# Patient Record
Sex: Female | Born: 1950
Health system: Southern US, Community
[De-identification: ages and names within clinical notes are randomized; demographics above are authoritative.]

## PROBLEM LIST (undated history)

## (undated) DIAGNOSIS — R600 Localized edema: Secondary | ICD-10-CM

## (undated) DIAGNOSIS — I10 Essential (primary) hypertension: Secondary | ICD-10-CM

## (undated) DIAGNOSIS — J45909 Unspecified asthma, uncomplicated: Secondary | ICD-10-CM

## (undated) DIAGNOSIS — M419 Scoliosis, unspecified: Secondary | ICD-10-CM

## (undated) DIAGNOSIS — M4800 Spinal stenosis, site unspecified: Secondary | ICD-10-CM

## (undated) DIAGNOSIS — M1990 Unspecified osteoarthritis, unspecified site: Secondary | ICD-10-CM

## (undated) DIAGNOSIS — E7800 Pure hypercholesterolemia, unspecified: Secondary | ICD-10-CM

## (undated) DIAGNOSIS — K219 Gastro-esophageal reflux disease without esophagitis: Secondary | ICD-10-CM

## (undated) DIAGNOSIS — F458 Other somatoform disorders: Secondary | ICD-10-CM

## (undated) DIAGNOSIS — R87619 Unspecified abnormal cytological findings in specimens from cervix uteri: Secondary | ICD-10-CM

## (undated) DIAGNOSIS — E669 Obesity, unspecified: Secondary | ICD-10-CM

## (undated) DIAGNOSIS — M519 Unspecified thoracic, thoracolumbar and lumbosacral intervertebral disc disorder: Secondary | ICD-10-CM

## (undated) DIAGNOSIS — J40 Bronchitis, not specified as acute or chronic: Secondary | ICD-10-CM

## (undated) DIAGNOSIS — J189 Pneumonia, unspecified organism: Secondary | ICD-10-CM

## (undated) DIAGNOSIS — M5126 Other intervertebral disc displacement, lumbar region: Secondary | ICD-10-CM

## (undated) HISTORY — DX: Unspecified abnormal cytological findings in specimens from cervix uteri: R87.619

## (undated) HISTORY — PX: HERNIA REPAIR: SHX51

## (undated) HISTORY — PX: TONSILLECTOMY AND ADENOIDECTOMY: SUR1326

## (undated) HISTORY — DX: Pure hypercholesterolemia, unspecified: E78.00

## (undated) HISTORY — DX: Essential (primary) hypertension: I10

## (undated) HISTORY — DX: Obesity, unspecified: E66.9

## (undated) HISTORY — PX: COLONOSCOPY W/ POLYPECTOMY: SHX1380

---
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
## 1988-01-03 HISTORY — PX: BREAST EXCISIONAL BIOPSY: SUR124

## 1988-01-03 HISTORY — PX: KNEE ARTHROSCOPY: SUR90

## 1988-01-03 HISTORY — PX: BREAST BIOPSY: SHX20

## 1991-01-03 HISTORY — PX: BACK SURGERY: SHX140

## 1992-01-03 HISTORY — PX: SHOULDER ARTHROSCOPY: SHX128

## 1998-02-16 ENCOUNTER — Ambulatory Visit (HOSPITAL_BASED_OUTPATIENT_CLINIC_OR_DEPARTMENT_OTHER): Admission: RE | Admit: 1998-02-16 | Discharge: 1998-02-16 | Payer: Self-pay | Admitting: Orthopedic Surgery

## 2000-01-26 ENCOUNTER — Encounter: Admission: RE | Admit: 2000-01-26 | Discharge: 2000-01-26 | Payer: Self-pay | Admitting: Family Medicine

## 2000-01-26 ENCOUNTER — Encounter: Payer: Self-pay | Admitting: Family Medicine

## 2001-03-07 ENCOUNTER — Encounter: Payer: Self-pay | Admitting: Family Medicine

## 2001-03-07 ENCOUNTER — Encounter: Admission: RE | Admit: 2001-03-07 | Discharge: 2001-03-07 | Payer: Self-pay | Admitting: Family Medicine

## 2001-05-21 ENCOUNTER — Other Ambulatory Visit: Admission: RE | Admit: 2001-05-21 | Discharge: 2001-05-21 | Payer: Self-pay | Admitting: Family Medicine

## 2002-12-16 ENCOUNTER — Encounter: Admission: RE | Admit: 2002-12-16 | Discharge: 2002-12-16 | Payer: Self-pay | Admitting: Family Medicine

## 2003-04-15 ENCOUNTER — Ambulatory Visit (HOSPITAL_COMMUNITY): Admission: RE | Admit: 2003-04-15 | Discharge: 2003-04-15 | Payer: Self-pay | Admitting: *Deleted

## 2003-06-27 ENCOUNTER — Ambulatory Visit (HOSPITAL_COMMUNITY): Admission: RE | Admit: 2003-06-27 | Discharge: 2003-06-27 | Payer: Self-pay | Admitting: Family Medicine

## 2003-11-22 ENCOUNTER — Emergency Department (HOSPITAL_COMMUNITY): Admission: EM | Admit: 2003-11-22 | Discharge: 2003-11-22 | Payer: Self-pay | Admitting: Emergency Medicine

## 2004-01-03 HISTORY — PX: LUMBAR DISC SURGERY: SHX700

## 2004-02-01 ENCOUNTER — Other Ambulatory Visit: Admission: RE | Admit: 2004-02-01 | Discharge: 2004-02-01 | Payer: Self-pay | Admitting: Family Medicine

## 2004-02-18 ENCOUNTER — Encounter: Admission: RE | Admit: 2004-02-18 | Discharge: 2004-02-18 | Payer: Self-pay | Admitting: Family Medicine

## 2004-10-20 ENCOUNTER — Ambulatory Visit (HOSPITAL_COMMUNITY): Admission: RE | Admit: 2004-10-20 | Discharge: 2004-10-21 | Payer: Self-pay | Admitting: Neurosurgery

## 2004-12-30 ENCOUNTER — Encounter: Admission: RE | Admit: 2004-12-30 | Discharge: 2004-12-30 | Payer: Self-pay | Admitting: Neurosurgery

## 2005-01-02 HISTORY — PX: CERVICAL DISC SURGERY: SHX588

## 2005-02-08 ENCOUNTER — Other Ambulatory Visit: Admission: RE | Admit: 2005-02-08 | Discharge: 2005-02-08 | Payer: Self-pay | Admitting: Family Medicine

## 2005-03-08 ENCOUNTER — Encounter: Admission: RE | Admit: 2005-03-08 | Discharge: 2005-03-08 | Payer: Self-pay | Admitting: Neurosurgery

## 2005-04-06 ENCOUNTER — Encounter: Admission: RE | Admit: 2005-04-06 | Discharge: 2005-04-06 | Payer: Self-pay | Admitting: Family Medicine

## 2005-04-13 ENCOUNTER — Observation Stay (HOSPITAL_COMMUNITY): Admission: RE | Admit: 2005-04-13 | Discharge: 2005-04-15 | Payer: Self-pay | Admitting: Neurosurgery

## 2006-02-09 ENCOUNTER — Other Ambulatory Visit: Admission: RE | Admit: 2006-02-09 | Discharge: 2006-02-09 | Payer: Self-pay | Admitting: Family Medicine

## 2006-02-20 ENCOUNTER — Encounter: Admission: RE | Admit: 2006-02-20 | Discharge: 2006-02-20 | Payer: Self-pay | Admitting: Family Medicine

## 2006-04-10 ENCOUNTER — Encounter: Admission: RE | Admit: 2006-04-10 | Discharge: 2006-04-10 | Payer: Self-pay | Admitting: Family Medicine

## 2006-04-18 ENCOUNTER — Encounter: Admission: RE | Admit: 2006-04-18 | Discharge: 2006-04-18 | Payer: Self-pay | Admitting: Family Medicine

## 2006-05-23 ENCOUNTER — Other Ambulatory Visit: Admission: RE | Admit: 2006-05-23 | Discharge: 2006-05-23 | Payer: Self-pay | Admitting: Family Medicine

## 2007-01-03 HISTORY — PX: TOTAL KNEE ARTHROPLASTY: SHX125

## 2007-02-13 ENCOUNTER — Other Ambulatory Visit: Admission: RE | Admit: 2007-02-13 | Discharge: 2007-02-13 | Payer: Self-pay | Admitting: Family Medicine

## 2007-04-11 ENCOUNTER — Encounter: Admission: RE | Admit: 2007-04-11 | Discharge: 2007-04-11 | Payer: Self-pay | Admitting: Family Medicine

## 2007-11-18 ENCOUNTER — Inpatient Hospital Stay (HOSPITAL_COMMUNITY): Admission: RE | Admit: 2007-11-18 | Discharge: 2007-11-20 | Payer: Self-pay | Admitting: Orthopedic Surgery

## 2008-02-17 ENCOUNTER — Other Ambulatory Visit: Admission: RE | Admit: 2008-02-17 | Discharge: 2008-02-17 | Payer: Self-pay | Admitting: Family Medicine

## 2008-04-14 ENCOUNTER — Encounter: Admission: RE | Admit: 2008-04-14 | Discharge: 2008-04-14 | Payer: Self-pay | Admitting: Family Medicine

## 2009-02-18 ENCOUNTER — Other Ambulatory Visit: Admission: RE | Admit: 2009-02-18 | Discharge: 2009-02-18 | Payer: Self-pay | Admitting: Family Medicine

## 2009-03-02 DIAGNOSIS — R87619 Unspecified abnormal cytological findings in specimens from cervix uteri: Secondary | ICD-10-CM

## 2009-03-02 HISTORY — DX: Unspecified abnormal cytological findings in specimens from cervix uteri: R87.619

## 2009-04-15 ENCOUNTER — Encounter: Admission: RE | Admit: 2009-04-15 | Discharge: 2009-04-15 | Payer: Self-pay | Admitting: Family Medicine

## 2010-01-23 ENCOUNTER — Encounter: Payer: Self-pay | Admitting: Family Medicine

## 2010-03-07 ENCOUNTER — Other Ambulatory Visit: Payer: Self-pay | Admitting: Family Medicine

## 2010-03-07 DIAGNOSIS — Z1231 Encounter for screening mammogram for malignant neoplasm of breast: Secondary | ICD-10-CM

## 2010-04-19 ENCOUNTER — Ambulatory Visit
Admission: RE | Admit: 2010-04-19 | Discharge: 2010-04-19 | Disposition: A | Discharge status: Home / Self Care | Payer: BLUE CROSS/BLUE SHIELD | Source: Ambulatory Visit | Attending: Family Medicine | Admitting: Family Medicine

## 2010-04-19 DIAGNOSIS — Z1231 Encounter for screening mammogram for malignant neoplasm of breast: Secondary | ICD-10-CM

## 2010-05-17 NOTE — Op Note (Signed)
[name], [name]                ACCOUNT NO.:  [id number]   MEDICAL RECORD NO.:  [id number]          PATIENT TYPE:  INP   LOCATION:  5033                         FACILITY:  [facility]   PHYSICIAN:  Elana Alm. Thurston Hole, M.D. DATE OF BIRTH:  1950/10/19   DATE OF PROCEDURE:  11/18/2007  DATE OF DISCHARGE:                               OPERATIVE REPORT   PREOPERATIVE DIAGNOSIS:  Right knee degenerative joint disease.   POSTOPERATIVE DIAGNOSIS:  Right knee degenerative joint disease.   PROCEDURE:  Right total knee replacement using a DePuy cemented total  knee system with #3 cemented femur, #3 cemented tibia with 15-mm  polyethylene RP tibial spacer, and 32-mm polyethylene cemented patella.   SURGEON:  Elana Alm. Thurston Hole, MD   ASSISTANT:  Julien Girt, PA   ANESTHESIA:  General.   OPERATIVE TIME:  1 hour and 30 minutes.   COMPLICATIONS:  None.   DESCRIPTION OF PROCEDURE:  Ms. Robeck was brought to the operating room  on November 18, 2007, after a femoral nerve block was placed in holding  by Anesthesia.  She was placed on the operative table in supine  position.  After being placed under general anesthesia, her right knee  was examined.  Range of motions from -5 to 125 degrees, moderate varus  deformity, and a stable ligamentous exam with normal patellar tracking.  She had a Foley catheter placed under sterile conditions.  She received  antibiotics preoperatively.  Her right leg was then prepped using  sterile DuraPrep and draped using sterile technique.  The leg was then  exsanguinated and a thigh tourniquet elevated to 375 mm.  Initially,  through a 15-cm longitudinal incision based over the patella, initial  exposure was made.  The underlying subcutaneous tissues were incised  along the skin incision.  A median arthrotomy was performed revealing an  excessive amount of normal-appearing joint fluid.  The articular  surfaces were inspected.  She had grade 4 changes medially, grade  3 and  4 changes laterally, and grade 3 and 4 changes in the patellofemoral  joint.  Osteophytes removed from the femoral condyles and tibial  plateau.  The medial and lateral meniscal remnants were removed as well  as the anterior cruciate ligament.  An intramedullary drill was then  drilled up the femoral canal for placement of distal femoral cutting  jig, which was placed in the appropriate manner by rotation and then a  distal 11-mm cut was made.  The distal femur was then incised.  The #3  was found to be the appropriate size.  The #3 cutting jig was placed in  the appropriate manner by external rotation and then these cuts were  made.  The proximal tibia was then exposed.  The tibial spines were  removed with an oscillating saw.  An intramedullary drill was drilled  down the tibial canal for placement of proximal tibial cutting jig,  which was placed in the appropriate manner by rotation and a proximal 6-  mm cut was made based off the medial or lower side.  Spacer blocks were  then placed in flexion  and extension.  The 15-mm cut blocks gave  excellent balancing, excellent stability, and excellent correction of  her flexion and varus deformities.  The proximal tibia was then exposed.  The #3 tibial base plate trial was placed on the cut tibial surface with  an excellent fit and the keel cut was made.  The PCL box cutter was then  placed on the distal femur and these cuts were made.  At this point, the  #3 femoral trial was placed and with the #3 tibial base plate trial and  a 15-mm polyethylene RP tibial spacer, knee was reduced, taken through a  range of motion 0-125 degrees with excellent stability and excellent  correction of her flexion, varus deformities and normal patellar  tracking.  Resurfacing 9-mm cut was then made on the patella and 3  locking holes placed for a 32-mm patella.  The patellar trial was placed  and again patellofemoral tracking was evaluated, and found to be  normal.  After this was done, it was felt that all the trial components were of  excellent size, fit, and stability.  They were then removed.  The knee  was then jet lavaged, irrigated with 3 liters of saline.  The proximal  tibia was then exposed.  The #3 tibial base plate with cement backing  was hammered into position with an excellent fit with excess cement  being removed from around the edges.  The #3 femoral component with  cement backing was hammered into position also with an excellent fit  with excess cement being removed from around the edges.  The 15-mm  polyethylene RP tibial spacer was placed on the tibial base plate.  The  knee reduced, taken through a range of motion 0-125 degrees with  excellent stability and excellent correction of her flexion and varus  deformities.  The 32-mm polyethylene cement backed patella was then  placed in each position and held there with a clamp.  After the cement  hardened, then the wound was further irrigated with saline.  Again,  patellofemoral tracking was evaluated to a full range of motion and this  was found to be excellent and normal patella tracking, and excellent  correction of her flexion and varus deformities.  At this point, it was  felt that all the components were of excellent size, fit, and stability.  After this was done, tourniquet was released.  Hemostasis obtained with  cautery.  The arthrotomy was then closed with #1 Ethibond suture over 2-  medium Hemovac drains.  Subcutaneous tissues closed with 0 and 2-0  Vicryl, subcuticular layer closed with 4-0 Monocryl.  Sterile dressings  were applied and a long-leg splint.  The patient then awakened,  extubated, and taken to recovery room in stable condition.  Needle and  sponge counts were correct x2 at the end the case.  Neurovascular status  normal postoperatively as well.      Robert A. Thurston Hole, M.D.  Electronically Signed     RAW/MEDQ  D:  11/18/2007  T:  11/19/2007   Job:  [id number]

## 2010-05-20 NOTE — Consult Note (Signed)
NAME:  [name], BILLING                ACCOUNT NO.:  [id number]   MEDICAL RECORD NO.:  [id number]          PATIENT TYPE:  OIB   LOCATION:  3016                         FACILITY:  [facility]   PHYSICIAN:  Corinna L. Lendell Caprice, MDDATE OF BIRTH:  [birth year]   DATE OF CONSULTATION:  04/14/2005  DATE OF DISCHARGE:                                   CONSULTATION   REASON FOR CONSULTATION:  Asthma.   IMPRESSIONS/RECOMMENDATIONS:  1.  HISTORY OF ASTHMA:  The patient has pseudo wheezing currently, but no      actual wheezing/bronchospasm.  She may have had this before.  Her chest      x-ray is negative for infiltrate, and her oxygen saturations are      reportedly normal.  She reports a cough productive of greenish sputum      today, and blood-tinged yesterday.  She may very well have acute      bronchitis.  I recommend Avelox 400 mg a day for 5 days, Mucolytic,      albuterol nebulizer as needed.  She does not need steroids.  I have      discussed this with Dr. Wynetta Emery.  2.  STATUS POST C5-6, C6-7 DISKECTOMY WITH ANTERIOR FUSION.  She is now      postoperative day #1.  3.  HYPERTENSION.  4.  HYPERLIPIDEMIA.   HISTORY OF PRESENT ILLNESS:  [name] is a pleasant 60 year old white  female patient of Dr. Shaune Pollack, who had the above-mentioned procedure  yesterday.  She apparently was ready to be discharged, but started  complaining of wheezing, shortness of breath upon discharge.  Per the nurse,  it would worsen when she would take off the oxygen.  She got an albuterol  nebulizer treatment, as well as Solu-Medrol IN.  The patient reports that  she is feeling short of breath and that she is wheezing currently.  She also  reports a cough productive of greenish sputum today, and it was blood-tinged  yesterday.  She feels that she has chest congestion that would not come up.   PAST MEDICAL HISTORY:  As above.   MEDICATIONS:  1.  Vicodin as needed.  2.  Avapro 300 mg a day.  3.  Hydrochlorothiazide 25  mg a day.  4.  Plendil 10 mg a day.  5.  Multivitamin a day.  6.  Zocor 20 mg a day.  7.  Vitamin C.  8.  Calcium with vitamin D.  9.  Labetolol 400 mg p.o. b.i.d.  10. Oxygen 2 L.  11. Medrol dose pack has been started today.   SOCIAL HISTORY:  The patient drinks occassionally.  She does not smoke.  She  is here with her husband and daughter.   FAMILY HISTORY:  Noncontributory.   REVIEW OF SYSTEMS:  As above, otherwise negative.   PHYSICAL EXAMINATION:  VITAL SIGNS:  Temperature 97.0, 89% pulse,  respiratory rate 20, blood pressure 106/67, oxygen saturation 95% on room  air.  GENERAL:  The patient is an obese white female, in no acute distress.  She  is very talkative and  has somewhat pressured speech.  HEENT:  Normocephalic and atraumatic.  Pupils equal, round, reactive to  light.  She has moist mucous membranes.  Oropharynx is clear.  NECK:  She has a soft collar in place.  LUNGS:  She has no wheezes, rhonchi, or rales.  She does have intermittent  upper airway sounds.  CARDIOVASCULAR:  Regular rate and rhythm; without murmur, gallop or rub.  ABDOMEN:  Normal bowel sounds; soft, nontender and nondistended.  GU/RECTAL:  Deferred.  EXTREMITIES:  No clubbing, cyanosis or edema.  SKIN:  No rash.   LABS:  None.   CHEST X-RAY:  PA and lateral chest x-ray shows atelectasis versus scar in  the left base.   I would like to thank Dr. Wynetta Emery for this consultation.      Corinna L. Lendell Caprice, MD  Electronically Signed     CLS/MEDQ  D:  04/14/2005  T:  04/15/2005  Job:  [id number]   cc:   Duncan Dull, M.D.  Fax: [phone]

## 2010-05-20 NOTE — Op Note (Signed)
[name], [name]                ACCOUNT NO.:  [id number]   MEDICAL RECORD NO.:  [id number]          PATIENT TYPE:  INP   LOCATION:  3016                         FACILITY:  [facility]   PHYSICIAN:  Reinaldo Meeker, M.D. DATE OF BIRTH:  September 10, 1950   DATE OF PROCEDURE:  04/27/2005  DATE OF DISCHARGE:  04/15/2005                                 OPERATIVE REPORT   PREOPERATIVE DIAGNOSIS:  Herniated disk at C5-6, C6-7.   POSTOPERATIVE DIAGNOSIS:  Herniated disk at C5-6, C6-7.   OPERATION PERFORMED:  C5-6, C6-7 anterior cervical diskectomy with bone bank  fusion followed by AcuFix anterior cervical plating.   SURGEON:  Reinaldo Meeker, M.D.   ASSISTANT:  Tia Alert, MD   ANESTHESIA:  General.   DESCRIPTION OF PROCEDURE:  After being placed in the supine position in five  pounds of halter traction, the patient's neck was prepped and draped in the  usual sterile fashion.  Localizing x-ray was taken prior to incision to  identify the appropriate level.  A transverse incision was made in the right  anterior neck starting at the midline and heading towards the medial aspect  of the sternocleidomastoid muscle.  Deep platysma muscle was incised  transversely.  The natural fascial plane between the strap muscles medially  and the sternocleidomastoid laterally were identified and followed down to  the anterior aspect of the cervical spine.  The longus colli muscles were  identified and split in the midline.  Self-retaining retractor was placed  for exposure.  X-ray showed approach to the appropriate level.  Using a 15  blade, the annulus of the disk at C5-6 and C6-7 was incised.  Starting at C5-  6, approximately 90% of the disk material was removed, which was then done  at C6-7.  High speed drill was used to widen both disk space levels.  Microscope was draped and brought into the field and used for the remainder  of the case.  Starting at C5-6, the remainder of the disk material on the  posterior longitudinal ligament was removed. The ligament was then incised  transversely and the cut edge was removed with a Kerrison punch.  Residual  disk material was then removed decompressing the spinal dura and nerve roots  bilaterally.  Attention was then turned to C6-7 where a similar procedure  was carried out.  The remainder of the disk material down to the posterior  longitudinal ligament was removed. The ligament was then incised  transversely and the cut edges removed with the Kerrison punch.  Once again  thorough decompression was carried out along the spinal dura and nerve roots  bilaterally so there is no evidence of compression.  At this time inspection  was carried out at both levels for any levels of residual compression and  none could be identified.  Large amounts of irrigation were carried out.  Any bleeding was controlled with bipolar coagulation and Gelfoam.  Measurements were taken and two bone bank plugs reconstituted.  Irrigation  was carried out once more and hemostasis was confirmed and a 7 mm plug was  impacted at C5-6 and an 8 mm plug at C6-7.  Fluoroscopy showed them to be in  good position.  An appropriate length anterior cervical plate was then  chosen.  Under fluoroscopic guidance, drill holes were placed followed by  placement of 13 mm screws times 6. Locking mechanism was secured.  Final  fluoroscopy was a little bit hard to determine due to the patient's large  body habitus but the plates, screws and plugs all appeared to be in good  position.  Large amounts of irrigation  was carried out and any bleeding controlled with bipolar coagulation.  The  wound was then closed using interrupted Vicryl on the platysma muscle and  inverted 5-0 on the subcuticular layer and Steri-Strips on the skin.  Sterile dressing and soft collar were applied and the patient was extubated  and taken to recovery room in stable condition.            ______________________________  Reinaldo Meeker, M.D.     ROK/MEDQ  D:  04/27/2005  T:  04/28/2005  Job:  [id number]

## 2010-05-20 NOTE — Op Note (Signed)
NAME:  [name], [name]                ACCOUNT NO.:  [id number]   MEDICAL RECORD NO.:  [id number]          PATIENT TYPE:  AMB   LOCATION:  SDS                          FACILITY:  [facility]   PHYSICIAN:  Reinaldo Meeker, M.D. DATE OF BIRTH:  [birth year]   DATE OF PROCEDURE:  10/20/2004  DATE OF DISCHARGE:                                 OPERATIVE REPORT   PREOPERATIVE DIAGNOSIS:  Herniated disc and lumbar stenosis, L4-L5 right.   POSTOPERATIVE DIAGNOSIS:  Herniated disc and lumbar stenosis, L4-L5 right.   PROCEDURES:  1.  Right L4-L5 decompressive laminotomy followed by right L4-L5      microdiskectomy.  2.  Microdissection, L4-L5 disc and L4-L5 nerve root.   SURGEON:  Reinaldo Meeker, M.D.   ASSISTANT:  Tia Alert, MD   PROCEDURE IN DETAIL:  After being placed in the prone position, the  patient's back was prepped and draped in the usual sterile fashion.  __________ x-rays prior to incision to identify the appropriate level.  Midline incision was made by the spinous process of L4 and L5.  Using Bovie  cutting current, the incision was carried down to the spinous processes.  Subperiosteal dissection was carried out along the right-sided spinous  processes and lamina.  A __________ self-retaining retractor was placed for  exposure.  X-ray showed approach at the appropriate level.   Using high-speed drill, the inferior one-half of the lamina and the medial  one-third of the facet joint was removed.  It was then used to remove the  superior one-half of the L5 lamina.  Residual bone, ligamentum flavum were  removed in a piecemeal fashion.  L5 nerve was found to be right in the  middle of the decompression and the decompression was carried out somewhat  superior to approach the L4-L5 disc.   At this time, microscope was draped, brought on the field and used for the  remainder of the case.  Using microdissection technique, the lateral aspect  of the thecal sac and L5 nerve root were  identified.  Further coagulation  was carried out on the floor of the canal to identify the L4-L5 disc, which  was found to be focally herniated beneath the nerve root.  After coagulating  the annulus, the annulus was incised with a 15 blade.  Using pituitary  rongeurs and curets, thorough disc space clean out was carried out while at  the same time great care was taken to avoid injury to the neural elements.  This was successfully done.  At this point, inspection was carried out in  all directions to find any evidence of residual compression.  None could be  identified.   A large amount of irrigation was carried out and any bleeding controlled  with bipolar coagulation and Gelfoam.  The wound was then closed using  interrupted Vicryl on the muscle, fascia, subcutaneous and subcuticular  tissues and staples no the skin.  Sterile dressing was then applied.  The  patient was extubated and taken to the recovery room in stable condition.  ______________________________  Reinaldo Meeker, M.D.     ROK/MEDQ  D:  10/20/2004  T:  10/20/2004  Job:  [id number]

## 2010-08-16 ENCOUNTER — Other Ambulatory Visit: Payer: Self-pay | Admitting: Family Medicine

## 2010-08-16 DIAGNOSIS — N6310 Unspecified lump in the right breast, unspecified quadrant: Secondary | ICD-10-CM

## 2010-08-19 ENCOUNTER — Ambulatory Visit
Admission: RE | Admit: 2010-08-19 | Discharge: 2010-08-19 | Disposition: A | Discharge status: Home / Self Care | Payer: BC Managed Care – PPO | Source: Ambulatory Visit | Attending: Family Medicine | Admitting: Family Medicine

## 2010-08-19 DIAGNOSIS — N6310 Unspecified lump in the right breast, unspecified quadrant: Secondary | ICD-10-CM

## 2010-10-04 LAB — COMPREHENSIVE METABOLIC PANEL
ALT: 26
AST: 36
Albumin: 3.9
Alkaline Phosphatase: 92
BUN: 13
CO2: 28
Calcium: 9.4
Chloride: 103
Creatinine, Ser: 0.75
GFR calc Af Amer: >60
GFR calc non Af Amer: >60
Glucose, Bld: 100 — ABNORMAL HIGH
Potassium: 3.7
Sodium: 139
Total Bilirubin: 0.6
Total Protein: 6.5

## 2010-10-04 LAB — GLUCOSE, CAPILLARY
Glucose-Capillary: 105 — ABNORMAL HIGH
Glucose-Capillary: 115 — ABNORMAL HIGH
Glucose-Capillary: 117 — ABNORMAL HIGH
Glucose-Capillary: 118 — ABNORMAL HIGH
Glucose-Capillary: 128 — ABNORMAL HIGH
Glucose-Capillary: 134 — ABNORMAL HIGH

## 2010-10-04 LAB — URINE CULTURE: Colony Count: 5000

## 2010-10-04 LAB — DIFFERENTIAL
Basophils Absolute: 0.1
Basophils Relative: 1
Eosinophils Absolute: 0.1
Eosinophils Relative: 2
Lymphocytes Relative: 28
Lymphs Abs: 2.4
Monocytes Absolute: 0.6
Monocytes Relative: 7
Neutro Abs: 5.2
Neutrophils Relative %: 62

## 2010-10-04 LAB — CBC
HCT: 44.3
Hemoglobin: 14.6
MCHC: 33
MCHC: 34.3
MCHC: 34.3
MCV: 86.6
MCV: 88.4
Platelets: 163
Platelets: 244
RBC: 3.72 — ABNORMAL LOW
RBC: 5.01
RDW: 13.3
WBC: 10.2
WBC: 8.5

## 2010-10-04 LAB — TYPE AND SCREEN
ABO/RH(D): A NEG
Antibody Screen: NEGATIVE

## 2010-10-04 LAB — BASIC METABOLIC PANEL
BUN: 8
CO2: 28
Calcium: 8.2 — ABNORMAL LOW
Calcium: 8.2 — ABNORMAL LOW
Chloride: 100
Creatinine, Ser: 0.71
Creatinine, Ser: 0.73
GFR calc Af Amer: >60
GFR calc Af Amer: >60
GFR calc non Af Amer: >60
Sodium: 135

## 2010-10-04 LAB — APTT: aPTT: 27

## 2010-10-04 LAB — URINE MICROSCOPIC-ADD ON

## 2010-10-04 LAB — URINALYSIS, ROUTINE W REFLEX MICROSCOPIC
Bilirubin Urine: NEGATIVE
Hgb urine dipstick: NEGATIVE
Ketones, ur: NEGATIVE
Nitrite: NEGATIVE
Urobilinogen, UA: 0.2

## 2010-10-04 LAB — PROTIME-INR
INR: 0.9
INR: 1.1
INR: 1.2
Prothrombin Time: 12.4
Prothrombin Time: 14.6
Prothrombin Time: 16 — ABNORMAL HIGH

## 2011-03-13 ENCOUNTER — Other Ambulatory Visit: Payer: Self-pay | Admitting: Family Medicine

## 2011-03-13 DIAGNOSIS — Z1231 Encounter for screening mammogram for malignant neoplasm of breast: Secondary | ICD-10-CM

## 2011-04-25 ENCOUNTER — Ambulatory Visit
Admission: RE | Admit: 2011-04-25 | Discharge: 2011-04-25 | Disposition: A | Discharge status: Home / Self Care | Payer: BC Managed Care – PPO | Source: Ambulatory Visit | Attending: Family Medicine | Admitting: Family Medicine

## 2011-04-25 DIAGNOSIS — Z1231 Encounter for screening mammogram for malignant neoplasm of breast: Secondary | ICD-10-CM

## 2011-08-30 ENCOUNTER — Other Ambulatory Visit: Payer: Self-pay | Admitting: Dermatology

## 2012-03-25 ENCOUNTER — Other Ambulatory Visit: Payer: Self-pay

## 2012-03-25 DIAGNOSIS — Z1231 Encounter for screening mammogram for malignant neoplasm of breast: Secondary | ICD-10-CM

## 2012-04-25 ENCOUNTER — Ambulatory Visit
Admission: RE | Admit: 2012-04-25 | Discharge: 2012-04-25 | Disposition: A | Discharge status: Home / Self Care | Payer: BC Managed Care – PPO | Source: Ambulatory Visit

## 2012-04-25 DIAGNOSIS — Z1231 Encounter for screening mammogram for malignant neoplasm of breast: Secondary | ICD-10-CM

## 2012-04-29 ENCOUNTER — Other Ambulatory Visit: Payer: Self-pay | Admitting: Family Medicine

## 2012-04-29 DIAGNOSIS — R928 Other abnormal and inconclusive findings on diagnostic imaging of breast: Secondary | ICD-10-CM

## 2012-04-30 ENCOUNTER — Telehealth: Payer: Self-pay | Admitting: Obstetrics and Gynecology

## 2012-04-30 NOTE — Telephone Encounter (Signed)
PHARMACY CALLING TO GET INSTRUCTION CHANGE ON MEDICATION PT IS TAKING. [facility] TEETER [phone]

## 2012-05-01 ENCOUNTER — Telehealth: Payer: Self-pay | Admitting: *Deleted

## 2012-05-01 NOTE — Telephone Encounter (Signed)
She takes provera 10 mg qd days 1-10 every OTHER month, so I'm not sure exactly what she wants in terms of how the rx is written, but a 30 day supply would be 10 tabs.  That is fine.  Refill  Until March 2015.

## 2012-05-01 NOTE — Telephone Encounter (Signed)
Pharmacy at [facility] called. Patient request medication be prescribed for a 30 day supply. Please advise. Chart in your cabinet. sue

## 2012-05-01 NOTE — Telephone Encounter (Signed)
Patient states at last visit Rx was written to take as directed and was dispensed for 30 and cost $3.99. Disregard this documentation. Patient has called back and has gotten this took care of at [facility].

## 2012-05-03 ENCOUNTER — Ambulatory Visit
Admission: RE | Admit: 2012-05-03 | Discharge: 2012-05-03 | Disposition: A | Discharge status: Home / Self Care | Payer: BC Managed Care – PPO | Source: Ambulatory Visit | Attending: Family Medicine | Admitting: Family Medicine

## 2012-05-03 DIAGNOSIS — R928 Other abnormal and inconclusive findings on diagnostic imaging of breast: Secondary | ICD-10-CM

## 2012-10-02 ENCOUNTER — Other Ambulatory Visit: Payer: Self-pay | Admitting: Family Medicine

## 2012-10-02 ENCOUNTER — Ambulatory Visit
Admission: RE | Admit: 2012-10-02 | Discharge: 2012-10-02 | Disposition: A | Discharge status: Home / Self Care | Payer: BC Managed Care – PPO | Source: Ambulatory Visit | Attending: Family Medicine | Admitting: Family Medicine

## 2012-10-02 DIAGNOSIS — R1032 Left lower quadrant pain: Secondary | ICD-10-CM

## 2012-10-02 MED ORDER — IOHEXOL 300 MG/ML  SOLN
125.0000 mL | Freq: Once | INTRAMUSCULAR | Status: AC | PRN
  Administered 2012-10-02: 125 mL via INTRAVENOUS

## 2012-10-10 ENCOUNTER — Other Ambulatory Visit: Payer: Self-pay | Admitting: Family Medicine

## 2012-10-10 ENCOUNTER — Encounter: Payer: Self-pay | Admitting: Gynecology

## 2012-10-10 DIAGNOSIS — N6320 Unspecified lump in the left breast, unspecified quadrant: Secondary | ICD-10-CM

## 2012-11-06 ENCOUNTER — Ambulatory Visit
Admission: RE | Admit: 2012-11-06 | Discharge: 2012-11-06 | Disposition: A | Discharge status: Home / Self Care | Payer: BC Managed Care – PPO | Source: Ambulatory Visit | Attending: Family Medicine | Admitting: Family Medicine

## 2012-11-06 DIAGNOSIS — N6320 Unspecified lump in the left breast, unspecified quadrant: Secondary | ICD-10-CM

## 2013-02-25 ENCOUNTER — Encounter: Payer: Self-pay | Admitting: Obstetrics and Gynecology

## 2013-02-27 ENCOUNTER — Ambulatory Visit: Payer: Self-pay | Admitting: Gynecology

## 2013-02-28 ENCOUNTER — Encounter: Payer: Self-pay | Admitting: Gynecology

## 2013-02-28 ENCOUNTER — Ambulatory Visit (INDEPENDENT_AMBULATORY_CARE_PROVIDER_SITE_OTHER): Payer: BC Managed Care – PPO | Admitting: Gynecology

## 2013-02-28 VITALS — BP 138/82 | HR 80 | Resp 20 | Ht 62.5 in | Wt 257.0 lb

## 2013-02-28 DIAGNOSIS — Z01419 Encounter for gynecological examination (general) (routine) without abnormal findings: Secondary | ICD-10-CM

## 2013-02-28 DIAGNOSIS — E7800 Pure hypercholesterolemia, unspecified: Secondary | ICD-10-CM | POA: Insufficient documentation

## 2013-02-28 DIAGNOSIS — Z124 Encounter for screening for malignant neoplasm of cervix: Secondary | ICD-10-CM

## 2013-02-28 DIAGNOSIS — N950 Postmenopausal bleeding: Secondary | ICD-10-CM | POA: Insufficient documentation

## 2013-02-28 DIAGNOSIS — I10 Essential (primary) hypertension: Secondary | ICD-10-CM | POA: Insufficient documentation

## 2013-02-28 MED ORDER — MEDROXYPROGESTERONE ACETATE 10 MG PO TABS: 10.0000 mg | ORAL_TABLET | Freq: Every day | ORAL | Status: DC

## 2013-02-28 NOTE — Progress Notes (Signed)
63 y.o. @Married  @Caucasian  female   G1P1001 here for annual exam. Pt reports menses Post-Menopausal.  She does not report hot flashes, does not have night sweats, does have vaginal dryness.  She is not using lubricants.  She does report post-menopasual bleeding, only in the month with medroxyprogesterone, she takes it every other month and does has a withdrawal bleed-can be brown spot to pink streak.  Last PUS 2013 with thin EMS 2.57mm.  No change from 2011.    No LMP recorded. Patient is postmenopausal.          Sexually active: yes  The current method of family planning is none.    Exercising: no  The patient does not participate in regular exercise at present. Last pap:02/2011 - Atypical Abnormal PAP: several in the past Mammogram: 11/2012 - Diagnostic. Screening 04/2012 BSE: None  Colonoscopy: Pt is due this year. She will schedule appt. DEXA: Pt doesn't recall. Pt states she is not due until age 74.  Alcohol: Rarely Tobacco: None  Health Maintenance  Topic Date Due  . Pap Smear  08/31/1968  . Tetanus/tdap  08/31/1969  . Colonoscopy  08/31/2000  . Zostavax  09/01/2010  . Influenza Vaccine  08/02/2012  . Mammogram  11/07/2014    Family History  Problem Relation Age of Onset  . Breast cancer Sister 65    bilateral mastectomy- Breast cancer   . Hypertension Mother   . Heart disease Mother   . Stroke Mother   . Heart disease Father   . Depression Father   . Stroke Maternal Grandmother   . Depression Daughter   . Emphysema Paternal Grandmother     There are no active problems to display for this patient.   Past Medical History  Diagnosis Date  . Obesity   . Hypertension   . Hypercholesteremia     Past Surgical History  Procedure Laterality Date  . Tonsillectomy and adenoidectomy    . Hernia repair  1978  . Knee arthroscopy Right 1990  . Total knee arthroplasty Right 2009  . Shoulder arthroscopy Left 1994    arthritis, hips,fingers  . Appendectomy  1965  .  Breast biopsy  1990  . Back surgery  1993    Thoracic and Lumbar  . Lumbar disc surgery  2006  . Cervical disc surgery  2007    Allergies: Lortab  Current Outpatient Prescriptions  Medication Sig Dispense Refill  . ALBUTEROL IN Inhale into the lungs as needed.      . Coenzyme Q10 (COQ10) 200 MG CAPS Take by mouth.      . cyclobenzaprine (FLEXERIL) 10 MG tablet Take 10 mg by mouth 2 (two) times daily.      . felodipine (PLENDIL) 10 MG 24 hr tablet Take 10 mg by mouth daily.      . Flaxseed, Linseed, (FLAX SEED OIL PO) Take by mouth.      . furosemide (LASIX) 20 MG tablet Take 20 mg by mouth.      Marland Kitchen GARLIC PO Take 7,371 mg by mouth.      . Grape Seed OIL by Does not apply route.      . irbesartan (AVAPRO) 300 MG tablet Take 300 mg by mouth daily.      Marland Kitchen labetalol (NORMODYNE) 200 MG tablet Take 200 mg by mouth 2 (two) times daily.      . Lutein 20 MG CAPS Take by mouth.      . medroxyPROGESTERone (PROVERA) 10 MG tablet Take 10 mg  by mouth daily.      . Multiple Vitamin (DAILY MULTIVITAMIN PO) Take by mouth. With D      . Omega-3 Fatty Acids (FISH OIL) 1200 MG CAPS Take by mouth.      . Probiotic Product (PROBIOTIC DAILY PO) Take by mouth.      . simvastatin (ZOCOR) 20 MG tablet Take 20 mg by mouth daily.      Marland Kitchen UNABLE TO FIND 1,000 mg. Med Name: [facility]ella [place]       No current facility-administered medications for this visit.    ROS: Pertinent items are noted in HPI.  Exam:    BP 138/82  Pulse 80  Resp 20  Ht 5' 2.5" (1.588 m)  Wt 257 lb (116.574 kg)  BMI 46.23 kg/m2 Weight change: @WEIGHTCHANGE @ Last 3 height recordings:  Ht Readings from Last 3 Encounters:  02/28/13 5' 2.5" (1.588 m)   General appearance: alert, cooperative and appears stated age Head: Normocephalic, without obvious abnormality, atraumatic Neck: no adenopathy, no carotid bruit, no JVD, supple, symmetrical, trachea midline and thyroid not enlarged, symmetric, no tenderness/mass/nodules Lungs: clear to  auscultation bilaterally Breasts: normal appearance, no masses or tenderness, dense Heart: regular rate and rhythm, S1, S2 normal, no murmur, click, rub or gallop Abdomen: soft, non-tender; bowel sounds normal; no masses,  no organomegaly Extremities: extremities normal, atraumatic, no cyanosis or edema Skin: Skin color, texture, turgor normal. No rashes or lesions Lymph nodes: Cervical, supraclavicular, and axillary nodes normal. no inguinal nodes palpated Neurologic: Grossly normal   Pelvic: External genitalia:  no lesions              Urethra: normal appearing urethra with no masses, tenderness or lesions              Bartholins and Skenes: normal                 Vagina: normal appearing vagina with normal color and discharge, no lesions              Cervix: normal appearance              Pap taken: yes        Bimanual Exam:  Uterus:  uterus is normal size, shape, consistency and nontender, limited by habitus                                      Adnexa:    no masses                                      Rectovaginal: Confirms                                      Anus:  normal sphincter tone, no lesions  A: well woman Morbid obesity Progestin withdraw every other month-variable flow      P: mammogram f/u due 4/15 pap smear done Would like pt to call with next months bleed, consider PUS and EMB, pt agreeable return annually or prn Pt asked to call with withdraw after March provera, if increased would recommend monthly withdraw Discussed PAP guideline changes, importance of weight bearing exercises, calcium, vit D and balanced diet.  An After Visit Summary was printed and given to  the patient.

## 2013-02-28 NOTE — Patient Instructions (Signed)

## 2013-03-05 ENCOUNTER — Ambulatory Visit: Payer: Self-pay | Admitting: Obstetrics and Gynecology

## 2013-03-05 LAB — IPS PAP TEST WITH HPV

## 2013-03-06 ENCOUNTER — Ambulatory Visit: Payer: Self-pay | Admitting: Gynecology

## 2013-03-07 ENCOUNTER — Other Ambulatory Visit: Payer: Self-pay

## 2013-03-07 DIAGNOSIS — Z1231 Encounter for screening mammogram for malignant neoplasm of breast: Secondary | ICD-10-CM

## 2013-03-17 ENCOUNTER — Telehealth: Payer: Self-pay | Admitting: Gynecology

## 2013-03-17 NOTE — Telephone Encounter (Signed)
Spoke with pt to let her know her Pap was normal. Pt pleased. Pt would like to update Dr. Charlies Constable that she has had no bleeding this month after taking the provera. She took her last pill last Tuesday, and nothing has happened. Pt takes the med every other month for 10 days. She had some "streaking of pale pink" on her pantyliner in January, but also had nothing at all in November. Pt has had no bleeding in between times taking the med. and just wanted TL to know.

## 2013-03-17 NOTE — Telephone Encounter (Signed)
Patient says Dr. Charlies Constable asked her to call with an update regarding her cycle. Patient also is asking for more details regarding her pap results. Did her pap show atypical cells?

## 2013-03-18 NOTE — Telephone Encounter (Signed)
Thank you :)

## 2013-04-30 ENCOUNTER — Ambulatory Visit
Admission: RE | Admit: 2013-04-30 | Discharge: 2013-04-30 | Disposition: A | Discharge status: Home / Self Care | Payer: BC Managed Care – PPO | Source: Ambulatory Visit

## 2013-04-30 DIAGNOSIS — Z1231 Encounter for screening mammogram for malignant neoplasm of breast: Secondary | ICD-10-CM

## 2013-08-12 ENCOUNTER — Other Ambulatory Visit: Payer: Self-pay | Admitting: Gastroenterology

## 2013-08-12 LAB — HM COLONOSCOPY

## 2013-11-03 ENCOUNTER — Encounter: Payer: Self-pay | Admitting: Gynecology

## 2013-12-03 ENCOUNTER — Emergency Department (HOSPITAL_COMMUNITY)
Admission: EM | Admit: 2013-12-03 | Discharge: 2013-12-03 | Disposition: A | Discharge status: Home / Self Care | Payer: BC Managed Care – PPO | Attending: Emergency Medicine | Admitting: Emergency Medicine

## 2013-12-03 ENCOUNTER — Encounter (HOSPITAL_COMMUNITY): Payer: Self-pay | Admitting: Family Medicine

## 2013-12-03 DIAGNOSIS — K625 Hemorrhage of anus and rectum: Secondary | ICD-10-CM | POA: Diagnosis present

## 2013-12-03 DIAGNOSIS — Z7951 Long term (current) use of inhaled steroids: Secondary | ICD-10-CM | POA: Insufficient documentation

## 2013-12-03 DIAGNOSIS — K921 Melena: Secondary | ICD-10-CM | POA: Diagnosis not present

## 2013-12-03 DIAGNOSIS — E669 Obesity, unspecified: Secondary | ICD-10-CM | POA: Insufficient documentation

## 2013-12-03 DIAGNOSIS — I10 Essential (primary) hypertension: Secondary | ICD-10-CM | POA: Insufficient documentation

## 2013-12-03 DIAGNOSIS — Z9889 Other specified postprocedural states: Secondary | ICD-10-CM | POA: Insufficient documentation

## 2013-12-03 DIAGNOSIS — E780 Pure hypercholesterolemia: Secondary | ICD-10-CM | POA: Insufficient documentation

## 2013-12-03 DIAGNOSIS — Z86010 Personal history of colonic polyps: Secondary | ICD-10-CM | POA: Diagnosis not present

## 2013-12-03 DIAGNOSIS — Z79899 Other long term (current) drug therapy: Secondary | ICD-10-CM | POA: Diagnosis not present

## 2013-12-03 LAB — ABO/RH: ABO/RH(D): A NEG

## 2013-12-03 LAB — COMPREHENSIVE METABOLIC PANEL
ALBUMIN: 3.6 g/dL (ref 3.5–5.2)
ALT: 25 U/L (ref 0–35)
AST: 25 U/L (ref 0–37)
Alkaline Phosphatase: 110 U/L (ref 39–117)
Anion gap: 14 (ref 5–15)
BUN: 10 mg/dL (ref 6–23)
CALCIUM: 8.7 mg/dL (ref 8.4–10.5)
CHLORIDE: 105 meq/L (ref 96–112)
CO2: 23 meq/L (ref 19–32)
CREATININE: 0.61 mg/dL (ref 0.50–1.10)
GFR calc Af Amer: >90 mL/min (ref >90)
GFR calc non Af Amer: >90 mL/min (ref >90)
Glucose, Bld: 93 mg/dL (ref 70–99)
Potassium: 4 mEq/L (ref 3.7–5.3)
SODIUM: 142 meq/L (ref 137–147)
Total Bilirubin: 0.5 mg/dL (ref 0.3–1.2)
Total Protein: 6.7 g/dL (ref 6.0–8.3)

## 2013-12-03 LAB — CBC
HCT: 42.4 % (ref 36.0–46.0)
Hemoglobin: 13.6 g/dL (ref 12.0–15.0)
MCH: 28.8 pg (ref 26.0–34.0)
MCHC: 32.1 g/dL (ref 30.0–36.0)
MCV: 89.6 fL (ref 78.0–100.0)
PLATELETS: 180 10*3/uL (ref 150–400)
RBC: 4.73 MIL/uL (ref 3.87–5.11)
RDW: 13 % (ref 11.5–15.5)
WBC: 7.4 10*3/uL (ref 4.0–10.5)

## 2013-12-03 LAB — TYPE AND SCREEN
ABO/RH(D): A NEG
Antibody Screen: NEGATIVE

## 2013-12-03 LAB — POC OCCULT BLOOD, ED: Fecal Occult Bld: POSITIVE — AB

## 2013-12-03 MED ORDER — AMOXICILLIN-POT CLAVULANATE 875-125 MG PO TABS: 1.0000 | ORAL_TABLET | Freq: Two times a day (BID) | ORAL | Status: DC

## 2013-12-03 NOTE — ED Notes (Signed)
Hemoccult results not crossing over. Result of Hemoccult was POSITIVE per verbal readback from [facility] EMT.

## 2013-12-03 NOTE — ED Notes (Signed)
Pt placed back on monitor.  Warm blanket given

## 2013-12-03 NOTE — ED Provider Notes (Signed)
CSN: [id number]     Arrival date & time 12/03/13  1309 History   First MD Initiated Contact with Patient 12/03/13 1328     Chief Complaint  Patient presents with  . Rectal Bleeding     Patient is a 63 y.o. female presenting with hematochezia. The history is provided by the patient.  Rectal Bleeding  [name] presents for evaluation of hematochezia. She reports she ate a hamburger two nights ago and that night developed diaphoresis and hot and cold flashes. Later in the evening she developed diarrhea. Yesterday morning she developed bright red blood per rectum passing some small clots. Today she passed a small amount of stool that was coated with clots but there was a decreased amount of blood present compared to the prior day. She reports some intermittent left lower quadrant pain. She saw her PCP yesterday who started her on Cipro and her diclofenac was stopped at that time. Symptoms are moderate and intermittent. Pain is nonradiating.  Past Medical History  Diagnosis Date  . Obesity   . Hypertension   . Hypercholesteremia    Past Surgical History  Procedure Laterality Date  . Tonsillectomy and adenoidectomy    . Hernia repair  1978  . Knee arthroscopy Right 1990  . Total knee arthroplasty Right 2009  . Shoulder arthroscopy Left 1994    arthritis, hips,fingers  . Appendectomy  1965  . Breast biopsy  1990  . Back surgery  1993    Thoracic and Lumbar  . Lumbar disc surgery  2006  . Cervical disc surgery  2007   Family History  Problem Relation Age of Onset  . Breast cancer Sister 19    bilateral mastectomy- Breast cancer   . Hypertension Mother   . Heart disease Mother   . Stroke Mother   . Heart disease Father   . Depression Father   . Stroke Maternal Grandmother   . Depression Daughter   . Emphysema Paternal Grandmother    History  Substance Use Topics  . Smoking status: Never Smoker   . Smokeless tobacco: Never Used  . Alcohol Use: Yes     Comment: rarely    OB History    Gravida Para Term Preterm AB TAB SAB Ectopic Multiple Living   1 1 1       1      Review of Systems  Gastrointestinal: Positive for hematochezia.      Allergies  Lortab  Home Medications   Prior to Admission medications   Medication Sig Start Date End Date Taking? Authorizing Provider  ALBUTEROL IN Inhale into the lungs as needed.    Historical Provider, MD  amoxicillin (AMOXIL) 500 MG capsule Take 500 mg by mouth as needed.    Historical Provider, MD  Coenzyme Q10 (COQ10) 200 MG CAPS Take by mouth.    Historical Provider, MD  cyclobenzaprine (FLEXERIL) 10 MG tablet Take 10 mg by mouth 2 (two) times daily.    Historical Provider, MD  diclofenac (VOLTAREN) 75 MG EC tablet Take 1 tablet by mouth daily. For 1 week. Dr. Lynann Bologna 02/24/13   Historical Provider, MD  Docusate Calcium (STOOL SOFTENER PO) Take by mouth as needed.    Historical Provider, MD  felodipine (PLENDIL) 10 MG 24 hr tablet Take 10 mg by mouth daily.    Historical Provider, MD  Flaxseed, Linseed, (FLAX SEED OIL PO) Take by mouth.    Historical Provider, MD  furosemide (LASIX) 20 MG tablet Take 20 mg by mouth.  Historical Provider, MD  GARLIC PO Take 7,169 mg by mouth.    Historical Provider, MD  Grape Seed OIL by Does not apply route.    Historical Provider, MD  HYDROcodone-acetaminophen (NORCO/VICODIN) 5-325 MG per tablet  01/07/13   Historical Provider, MD  ipratropium-albuterol (DUONEB) 0.5-2.5 (3) MG/3ML SOLN Take 3 mLs by nebulization.    Historical Provider, MD  irbesartan (AVAPRO) 300 MG tablet Take 300 mg by mouth daily.    Historical Provider, MD  labetalol (NORMODYNE) 200 MG tablet Take 200 mg by mouth 2 (two) times daily.    Historical Provider, MD  lidocaine-hydrocortisone (LIDAZONE HC) 3-0.5 % CREA Place 1 Applicatorful rectally as needed.    Historical Provider, MD  Lutein 20 MG CAPS Take by mouth.    Historical Provider, MD  medroxyPROGESTERone (PROVERA) 10 MG tablet Take 1 tablet (10 mg  total) by mouth daily. 02/28/13   Elveria Rising, MD  Multiple Vitamin (DAILY MULTIVITAMIN PO) Take by mouth. With D    Historical Provider, MD  Omega-3 Fatty Acids (FISH OIL) 1200 MG CAPS Take by mouth.    Historical Provider, MD  Probiotic Product (PROBIOTIC DAILY PO) Take by mouth.    Historical Provider, MD  simvastatin (ZOCOR) 20 MG tablet Take 20 mg by mouth daily.    Historical Provider, MD  UNABLE TO FIND 1,000 mg. Med Name: Lethasin    Historical Provider, MD  Vitamin D, Cholecalciferol, 1000 UNITS TABS Take 1 tablet by mouth 2 (two) times daily.    Historical Provider, MD   BP 141/77 mmHg  Pulse 77  Temp(Src) 98.4 F (36.9 C) (Oral)  Resp 18  SpO2 96% Physical Exam  Constitutional: She is oriented to person, place, and time. She appears well-developed and well-nourished.  HENT:  Head: Normocephalic and atraumatic.  Cardiovascular: Normal rate and regular rhythm.   No murmur heard. Pulmonary/Chest: Effort normal and breath sounds normal. No respiratory distress.  Abdominal: Soft. There is tenderness. There is no rebound and no guarding.  Mild LLQ tenderness without guarding or rebound  Genitourinary:  Slight gross blood, nontender  Musculoskeletal: She exhibits no edema or tenderness.  Neurological: She is alert and oriented to person, place, and time.  Skin: Skin is warm and dry.  Psychiatric: She has a normal mood and affect. Her behavior is normal.  Nursing note and vitals reviewed.   ED Course  Procedures (including critical care time) Labs Review Labs Reviewed  CBC  COMPREHENSIVE METABOLIC PANEL  POC OCCULT BLOOD, ED  TYPE AND SCREEN  ABO/RH    Imaging Review No results found.   EKG Interpretation None      MDM   Final diagnoses:  Hematochezia    Patient here for hematochezia and mild left lower quadrant pain. She had a recent colonoscopy back in August which was negative for diverticulosis, she did have a polyp removed at that time. Any  anticoagulants and she is nontoxic on exam. Clinical picture is not consistent with diverticular abscess. Discussed with patient continuing course with antibiotics with close PCP follow-up and return precautions. Patient is concerned about side effects from ciprofloxacin given her history of tendinitis, will change Cipro to Augmentin.    Quintella Reichert, MD 12/03/13 [id number]

## 2013-12-03 NOTE — ED Notes (Addendum)
Per pt sts since Monday night she has been having N,D and abdominal pain. sts sharp in LLQ. sts she ate a hamburger prior to this. sts she has been seeing blood in her stool. sts her doctors started her on abx a few days ago.

## 2013-12-03 NOTE — Discharge Instructions (Signed)
Stop taking your ciprofloxacin and start taking Augmentin.  Bloody Stools Bloody stools often mean that there is a problem in the digestive tract. Your caregiver may use the term "melena" to describe black, tarry, and bad smelling stools or "hematochezia" to describe red or maroon-colored stools. Blood seen in the stool can be caused by bleeding anywhere along the intestinal tract.  A black stool usually means that blood is coming from the upper part of the gastrointestinal tract (esophagus, stomach, or small bowel). Passing maroon-colored stools or bright red blood usually means that blood is coming from lower down in the large bowel or the rectum. However, sometimes massive bleeding in the stomach or small intestine can cause bright red bloody stools.  Consuming black licorice, lead, iron pills, medicines containing bismuth subsalicylate, or blueberries can also cause black stools. Your caregiver can test black stools to see if blood is present. It is important that the cause of the bleeding be found. Treatment can then be started, and the problem can be corrected. Rectal bleeding may not be serious, but you should not assume everything is okay until you know the cause.It is very important to follow up with your caregiver or a specialist in gastrointestinal problems. CAUSES  Blood in the stools can come from various underlying causes.Often, the cause is not found during your first visit. Testing is often needed to discover the cause of bleeding in the gastrointestinal tract. Causes range from simple to serious or even life-threatening.Possible causes include:  Hemorrhoids.These are veins that are full of blood (engorged) in the rectum. They cause pain, inflammation, and may bleed.  Anal fissures.These are areas of painful tearing which may bleed. They are often caused by passing hard stool.  Diverticulosis.These are pouches that form on the colon over time, with age, and may bleed  significantly.  Diverticulitis.This is inflammation in areas with diverticulosis. It can cause pain, fever, and bloody stools, although bleeding is rare.  Proctitis and colitis. These are inflamed areas of the rectum or colon. They may cause pain, fever, and bloody stools.  Polyps and cancer. Colon cancer is a leading cause of preventable cancer death.It often starts out as precancerous polyps that can be removed during a colonoscopy, preventing progression into cancer. Sometimes, polyps and cancer may cause rectal bleeding.  Gastritis and ulcers.Bleeding from the upper gastrointestinal tract (near the stomach) may travel through the intestines and produce black, sometimes tarry, often bad smelling stools. In certain cases, if the bleeding is fast enough, the stools may not be black, but red and the condition may be life-threatening. SYMPTOMS  You may have stools that are bright red and bloody, that are normal color with blood on them, or that are dark black and tarry. In some cases, you may only have blood in the toilet bowl. Any of these cases need medical care. You may also have:  Pain at the anus or anywhere in the rectum.  Lightheadedness or feeling faint.  Extreme weakness.  Nausea or vomiting.  Fever. DIAGNOSIS Your caregiver may use the following methods to find the cause of your bleeding:  Taking a medical history. Age is important. Older people tend to develop polyps and cancer more often. If there is anal pain and a hard, large stool associated with bleeding, a tear of the anus may be the cause. If blood drips into the toilet after a bowel movement, bleeding hemorrhoids may be the problem. The color and frequency of the bleeding are additional considerations. In most cases,  the medical history provides clues, but seldom the final answer.  A visual and finger (digital) exam. Your caregiver will inspect the anal area, looking for tears and hemorrhoids. A finger exam can provide  information when there is tenderness or a growth inside. In men, the prostate is also examined.  Endoscopy. Several types of small, long scopes (endoscopes) are used to view the colon.  In the office, your caregiver may use a rigid, or more commonly, a flexible viewing sigmoidoscope. This exam is called flexible sigmoidoscopy. It is performed in 5 to 10 minutes.  A more thorough exam is accomplished with a colonoscope. It allows your caregiver to view the entire 5 to 6 foot long colon. Medicine to help you relax (sedative) is usually given for this exam. Frequently, a bleeding lesion may be present beyond the reach of the sigmoidoscope. So, a colonoscopy may be the best exam to start with. Both exams are usually done on an outpatient basis. This means the patient does not stay overnight in the hospital or surgery center.  An upper endoscopy may be needed to examine your stomach. Sedation is used and a flexible endoscope is put in your mouth, down to your stomach.  A barium enema X-ray. This is an X-ray exam. It uses liquid barium inserted by enema into the rectum. This test alone may not identify an actual bleeding point. X-rays highlight abnormal shadows, such as those made by lumps (tumors), diverticuli, or colitis. TREATMENT  Treatment depends on the cause of your bleeding.   For bleeding from the stomach or colon, the caregiver doing your endoscopy or colonoscopy may be able to stop the bleeding as part of the procedure.  Inflammation or infection of the colon can be treated with medicines.  Many rectal problems can be treated with creams, suppositories, or warm baths.  Surgery is sometimes needed.  Blood transfusions are sometimes needed if you have lost a lot of blood.  For any bleeding problem, let your caregiver know if you take aspirin or other blood thinners regularly. HOME CARE INSTRUCTIONS   Take any medicines exactly as prescribed.  Keep your stools soft by eating a diet  high in fiber. Prunes (1 to 3 a day) work well for many people.  Drink enough water and fluids to keep your urine clear or pale yellow.  Take sitz baths if advised. A sitz bath is when you sit in a bathtub with warm water for 10 to 15 minutes to soak, soothe, and cleanse the rectal area.  If enemas or suppositories are advised, be sure you know how to use them. Tell your caregiver if you have problems with this.  Monitor your bowel movements to look for signs of improvement or worsening. SEEK MEDICAL CARE IF:   You do not improve in the time expected.  Your condition worsens after initial improvement.  You develop any new symptoms. SEEK IMMEDIATE MEDICAL CARE IF:   You develop severe or prolonged rectal bleeding.  You vomit blood.  You feel weak or faint.  You have a fever. MAKE SURE YOU:  Understand these instructions.  Will watch your condition.  Will get help right away if you are not doing well or get worse. Document Released: 12/09/2001 Document Revised: 03/13/2011 Document Reviewed: 05/06/2010 [facility] Patient Information 2015 [place], [place]. This information is not intended to replace advice given to you by your health care provider. Make sure you discuss any questions you have with your health care provider.

## 2014-03-06 ENCOUNTER — Encounter: Payer: Self-pay | Admitting: Nurse Practitioner

## 2014-03-06 ENCOUNTER — Ambulatory Visit: Payer: BC Managed Care – PPO | Admitting: Gynecology

## 2014-03-06 ENCOUNTER — Ambulatory Visit (INDEPENDENT_AMBULATORY_CARE_PROVIDER_SITE_OTHER): Payer: BC Managed Care – PPO | Admitting: Nurse Practitioner

## 2014-03-06 VITALS — BP 130/82 | HR 88 | Ht 62.5 in | Wt 253.0 lb

## 2014-03-06 DIAGNOSIS — Z0000 Encounter for general adult medical examination without abnormal findings: Secondary | ICD-10-CM

## 2014-03-06 DIAGNOSIS — E2839 Other primary ovarian failure: Secondary | ICD-10-CM | POA: Diagnosis not present

## 2014-03-06 DIAGNOSIS — N950 Postmenopausal bleeding: Secondary | ICD-10-CM | POA: Diagnosis not present

## 2014-03-06 DIAGNOSIS — Z01419 Encounter for gynecological examination (general) (routine) without abnormal findings: Secondary | ICD-10-CM

## 2014-03-06 MED ORDER — MEDROXYPROGESTERONE ACETATE 10 MG PO TABS: 10.0000 mg | ORAL_TABLET | Freq: Every day | ORAL | Status: DC

## 2014-03-06 NOTE — Progress Notes (Signed)
Patient ID: [name], female   DOB: [birth year], 64 y.o.   MRN: [id number] 64 y.o. [id number] Widowed  Caucasian Fe here for annual exam.  Takes Provera every other month with 1-2 days of streak of brown.  Last time in Mar 01, 2022.  Husband died last 04/30/22 at 23 with aorta dissection of the ascending aorta.  Daughter just got married in December.  Patient's last menstrual period was 01/14/2014.          Sexually active: No.  The current method of family planning is none and post menopausal status.    Exercising: No.  Exercise is limited by orthopedic condition(s): hip pain.  Pt needs bilateral hip replacements.  Pt tries to walk occasionally..  Pt is using upper body weights. Smoker:  no  Health Maintenance: Pap:  02/28/13, negative with neg HR HPV (remote history of CIN I 2009/04/29 - normal since) MMG:  04/30/13, 3D, Bi-Rads 1:  Negative  Colonoscopy:  08/2013, polyp, repeat in 5 years (Dr. Michail Sermon) BMD:   2003/04/30, normal TDaP:  PCP Labs:  Labs in ED 12/2013, will have fasting labs on 03/12/14 with Dr. Lesli Albee   reports that she has never smoked. She has never used smokeless tobacco. She reports that she drinks alcohol. She reports that she does not use illicit drugs.  Past Medical History  Diagnosis Date  . Obesity   . Hypercholesteremia   . Hypertension since 1990's  . Abnormal Pap smear of cervix 29-Apr-2009    Colpo Biopsy CIN I with HPV    Past Surgical History  Procedure Laterality Date  . Tonsillectomy and adenoidectomy    . Hernia repair  04/30/1975, as child    bilateral inguinal -1 during pregnancy.  Umbilical repair as child  . Knee arthroscopy Right 1990  . Total knee arthroplasty Right 04-30-07  . Shoulder arthroscopy Left 1994    arthritis, hips,fingers  . Appendectomy  1965  . Back surgery  1993    Thoracic and Lumbar  . Lumbar disc surgery  Apr 29, 2004  . Cervical disc surgery  April 29, 2005  . Breast biopsy Right 1990    benign cyst    Current Outpatient Prescriptions  Medication Sig Dispense  Refill  . ALBUTEROL IN Inhale into the lungs as needed.    . Coenzyme Q10 (COQ10) 200 MG CAPS Take by mouth.    . cyclobenzaprine (FLEXERIL) 10 MG tablet Take 10 mg by mouth 2 (two) times daily as needed.     . diclofenac (VOLTAREN) 75 MG EC tablet Take 1 tablet by mouth daily.    Mariane Baumgarten Calcium (STOOL SOFTENER PO) Take by mouth as needed.    . felodipine (PLENDIL) 10 MG 24 hr tablet Take 10 mg by mouth daily.    . Flaxseed, Linseed, (FLAX SEED OIL PO) Take by mouth.    . furosemide (LASIX) 20 MG tablet Take 20 mg by mouth.    Marland Kitchen GARLIC PO Take 5,573 mg by mouth.    . Grape Seed OIL by Does not apply route.    Marland Kitchen ipratropium-albuterol (DUONEB) 0.5-2.5 (3) MG/3ML SOLN Take 3 mLs by nebulization every 4 (four) hours as needed.     . irbesartan (AVAPRO) 300 MG tablet Take 300 mg by mouth daily.    Marland Kitchen labetalol (NORMODYNE) 200 MG tablet Take 200 mg by mouth 2 (two) times daily.    Marland Kitchen lidocaine-hydrocortisone (LIDAZONE HC) 3-0.5 % CREA Place 1 Applicatorful rectally as needed.    . Lutein 20 MG CAPS Take by  mouth.    . medroxyPROGESTERone (PROVERA) 10 MG tablet Take 1 tablet (10 mg total) by mouth daily. 30 tablet 3  . Multiple Vitamin (DAILY MULTIVITAMIN PO) Take by mouth. With D    . Omega-3 Fatty Acids (FISH OIL) 1200 MG CAPS Take by mouth.    . Probiotic Product (PROBIOTIC DAILY PO) Take by mouth.    . simvastatin (ZOCOR) 20 MG tablet Take 20 mg by mouth daily.    Marland Kitchen UNABLE TO FIND 1,000 mg. Med Name: Suella Grove    . Vitamin D, Cholecalciferol, 1000 UNITS TABS Take 1 tablet by mouth 2 (two) times daily.     No current facility-administered medications for this visit.    Family History  Problem Relation Age of Onset  . Breast cancer Sister 54    bilateral mastectomy- Breast cancer   . Hypertension Mother   . Heart disease Mother   . Stroke Mother   . Heart disease Father   . Depression Father   . Alcohol abuse Father   . Stroke Maternal Grandmother   . Depression Daughter   .  Emphysema Paternal Grandmother   . Breast cancer Maternal Aunt     Both aunts in 33 -71's  . Heart failure Paternal Grandfather   . Alcohol abuse Paternal Grandfather     ROS:  Pertinent items are noted in HPI.  Otherwise, a comprehensive ROS was negative.  Exam:   BP 130/82 mmHg  Pulse 88  Ht 5' 2.5" (1.588 m)  Wt 253 lb (114.76 kg)  BMI 45.51 kg/m2  LMP 01/14/2014 Height: 5' 2.5" (158.8 cm) Ht Readings from Last 3 Encounters:  03/06/14 5' 2.5" (1.588 m)  02/28/13 5' 2.5" (1.588 m)    General appearance: alert, cooperative and appears stated age Head: Normocephalic, without obvious abnormality, atraumatic Neck: no adenopathy, supple, symmetrical, trachea midline and thyroid normal to inspection and palpation Lungs: clear to auscultation bilaterally Breasts: normal appearance, no masses or tenderness Heart: regular rate and rhythm Abdomen: soft, non-tender; no masses,  no organomegaly Extremities: extremities normal, atraumatic, no cyanosis or edema Skin: Skin color, texture, turgor normal. No rashes or lesions Lymph nodes: Cervical, supraclavicular, and axillary nodes normal. No abnormal inguinal nodes palpated Neurologic: Grossly normal   Pelvic: External genitalia:  no lesions              Urethra:  normal appearing urethra with no masses, tenderness or lesions              Bartholin's and Skene's: normal                 Vagina: normal appearing vagina with normal color and discharge, no lesions              Cervix: anteverted              Pap taken: Yes.   Bimanual Exam:  Uterus:  normal size, contour, position, consistency, mobility, non-tender              Adnexa: no mass, fullness, tenderness               Rectovaginal: Confirms               Anus:  normal sphincter tone, no lesions  Chaperone present: yes  A:  Well Woman with normal exam  Postmenopausal  At risk for endo hyperplasia and on Provera 10 mg every other month  Last endo biopsy 10/28/2007  fragmented Proliferative endo  Morbid obesity  Progestin withdrawal every other month-variable flow   P:   Reviewed health and wellness pertinent to exam  Pap smear taken today  Mammogram is due 4/16  Note for BMD  Discussed with Dr. Quincy Simmonds will get PUS and endo biopsy again, pt. is agreeable to plan  Counseled on breast self exam, mammography screening, use and side effects of HRT, adequate intake of calcium and vitamin D, diet and exercise return annually or prn  An After Visit Summary was printed and given to the patient.

## 2014-03-06 NOTE — Patient Instructions (Signed)

## 2014-03-09 NOTE — Progress Notes (Signed)
Encounter reviewed by Dr. Brook Silva.  

## 2014-03-10 ENCOUNTER — Telehealth: Payer: Self-pay | Admitting: Obstetrics & Gynecology

## 2014-03-10 DIAGNOSIS — N950 Postmenopausal bleeding: Secondary | ICD-10-CM

## 2014-03-10 LAB — IPS PAP TEST WITH HPV

## 2014-03-10 NOTE — Telephone Encounter (Signed)
Call to patient. Advised of benefit quote receive for PUS. Patient agreeable. Scheduled PUS. Advised patient of 72 hour cancellation policy and $025 cancellation fee. Patient agreeable.

## 2014-03-12 NOTE — Telephone Encounter (Signed)
Good then we can proceed

## 2014-03-13 ENCOUNTER — Other Ambulatory Visit: Payer: Self-pay

## 2014-03-13 DIAGNOSIS — Z1231 Encounter for screening mammogram for malignant neoplasm of breast: Secondary | ICD-10-CM

## 2014-03-26 ENCOUNTER — Ambulatory Visit (INDEPENDENT_AMBULATORY_CARE_PROVIDER_SITE_OTHER): Payer: BC Managed Care – PPO

## 2014-03-26 ENCOUNTER — Ambulatory Visit (INDEPENDENT_AMBULATORY_CARE_PROVIDER_SITE_OTHER): Payer: BC Managed Care – PPO | Admitting: Obstetrics & Gynecology

## 2014-03-26 ENCOUNTER — Other Ambulatory Visit: Payer: Self-pay | Admitting: Obstetrics & Gynecology

## 2014-03-26 VITALS — BP 148/88 | Wt 253.0 lb

## 2014-03-26 DIAGNOSIS — N950 Postmenopausal bleeding: Secondary | ICD-10-CM

## 2014-03-26 DIAGNOSIS — N8500 Endometrial hyperplasia, unspecified: Secondary | ICD-10-CM | POA: Diagnosis not present

## 2014-03-26 NOTE — Progress Notes (Addendum)
64 y.o. [name] here for a pelvic ultrasound with sonohystogram due to brown streaks every other month after taking Provera.  Pt cycles into her early 17'G so was only cyclic progestins to help regulate bleeding.  Pt did have an endometrial biopsy in 2009.  Last ultrasound was 2013 and then before that was 2011.  Endometrium was thin both times.  Pt reports she doesn't enjoy taking the Provera and it sometimes makes her feel hormonal.  Would like to take less if appropriate.   Patient's last menstrual period was 01/14/2014.  Contraception: PMP  Technique:  Both transabdominal and transvaginal ultrasound examinations of the pelvis were performed. Transabdominal technique was performed for global imaging of the pelvis including uterus, ovaries, adnexal regions, and pelvic cul-de-sac.  It was necessary to proceed with endovaginal exam following the abdominal ultrasound transabdominal exam to visualize the endometrium and adnexa.  Color and duplex Doppler ultrasound was utilized to evaluate blood flow to the ovaries.    FINDINGS: Uterus: 5.6 x 3.9 x 2.5cm Endometrium: 3.14mm Adnexa:  Left: 2.2 x 1.6 x 1.0cm     Right: 2.0 x 1.7 x 0.9cn Cul de sac: no free fluid  SHSG:  After obtaining appropriate verbal consent from patient, the cervix was visualized using a speculum, and prepped with betadine.  A tenaculum  was applied to the cervix.  Dilation of the cervix was necessary. The catheter was passed into the uterus and sterile saline introduced, with the following findings:  No intracavitary lesions.  Endometrial biopsy recommended. Verbal and written consent obtained.  Speculum placed.  Cervix visualized and cleansed with betadine prep.  A single toothed tenaculum was applied to the anterior lip of the cervix.  Endometrial pipelle was advanced through the cervix into the endometrial cavity without difficulty.  Pipelle passed to 6 cm.  Suction applied and pipelle removed with good tissue sample  obtained.  Tenculum removed.  No bleeding noted.  Patient tolerated procedure well.  All instruments removed.  Reviewed findings with pt.  Depending on biopsy results, may decrease provera to every six months instead of every other month.  Has been several years since with normal ultrasound findings.    Assessment:  H/O PMP bleeding after provera challenge every other month for over a year.  Bleeding is very light. Obesity  Plan:  Endometrial biopsy pending.  Will discuss results and plan after results are finalized.  Pt in agreement with plan.  ~25 minutes spent with patient >50% of time was in face to face discussion of above.

## 2014-04-02 ENCOUNTER — Encounter: Payer: Self-pay | Admitting: Obstetrics & Gynecology

## 2014-04-06 ENCOUNTER — Telehealth: Payer: Self-pay | Admitting: Emergency Medicine

## 2014-04-06 NOTE — Telephone Encounter (Signed)
-----   Message from Megan Salon, MD sent at 04/02/2014  3:50 PM EDT ----- Please inform endometrial biopsy showed no abnormal cells.  I reviewed old chart and pt has no hx of endometrial hyperplasia.  Ok to decrease Provera 10mg  x 10d to just twice a year.  If has bleeding after provera in the future, I do want her to call.    CC:  Edman Circle

## 2014-04-06 NOTE — Telephone Encounter (Signed)
Spoke with patient and message from Dr. Sabra Heck given to patient. She is agreeable. She is advised to call with any bleeding after q 6 month provera. Patient verbalized understanding and will follow up as needed. Routing to provider for final review. Patient agreeable to disposition. Will close encounter

## 2014-05-04 ENCOUNTER — Ambulatory Visit
Admission: RE | Admit: 2014-05-04 | Discharge: 2014-05-04 | Disposition: A | Discharge status: Home / Self Care | Payer: BC Managed Care – PPO | Source: Ambulatory Visit

## 2014-05-04 ENCOUNTER — Ambulatory Visit
Admission: RE | Admit: 2014-05-04 | Discharge: 2014-05-04 | Disposition: A | Discharge status: Home / Self Care | Payer: BC Managed Care – PPO | Source: Ambulatory Visit | Attending: Nurse Practitioner | Admitting: Nurse Practitioner

## 2014-05-04 DIAGNOSIS — Z1231 Encounter for screening mammogram for malignant neoplasm of breast: Secondary | ICD-10-CM

## 2014-05-04 DIAGNOSIS — E2839 Other primary ovarian failure: Secondary | ICD-10-CM

## 2014-07-17 ENCOUNTER — Telehealth: Payer: Self-pay | Admitting: Nurse Practitioner

## 2014-07-17 NOTE — Telephone Encounter (Signed)
Patient is asking for her recent BMD results. Last seen 03/26/14.

## 2014-07-17 NOTE — Telephone Encounter (Signed)
Spoke with patient. Advised of message as seen below. Patient states that she is having her right hip replaced on 08/04/2014 and will be having the left hip replaced a couple of months later. States she is unable to walk because she is in so much pain. Has been lifting light upper body weights. Tried to swim but it is too painful. Is taking 3,000 IU of Vitamin D a day. No calcium supplement. Is getting calcium through diet as she has history of constipation. Advised patient I will speak with Milford Cage, FNP and return call with any further recommendations. Patient is agreeable. Would like BMD results faxed to PCP Dr.Willards office. Faxed with cover sheet to Dr.Willards office.  Notes Recorded by Kem Boroughs, FNP on 05/29/2014 at 6:49 PM Results via my chart:  [name], The Bone Density done on 5/216 shows a T Score at the left hip neck of -1.2 and the forearm 1.6. The hip falls into the low normal category. Cut in comparison to previous exam in 2008 there has been a decrease of the hip at 4.6%. While some bone loss is normal - you need to start walking at least 3 times a week. Repeat in 2 years.

## 2014-07-17 NOTE — Telephone Encounter (Signed)
Most likely then her lower BMD is related to lack of exercise secondary to pain.  Ok to close encounter

## 2014-07-17 NOTE — Telephone Encounter (Signed)
Spoke with patient. Patient states that she is currently taking magnesium and calcium in her daily vitamin. Advised of message as seen below from Milford Cage, [place]. Patient is agreeable.  Routing to provider for final review. Patient agreeable to disposition. Will close encounter.   Patient aware provider will review message and nurse will return call if any additional advice or change of disposition.

## 2014-07-20 ENCOUNTER — Other Ambulatory Visit (HOSPITAL_COMMUNITY): Payer: Self-pay | Admitting: Orthopaedic Surgery

## 2014-07-24 ENCOUNTER — Encounter (HOSPITAL_COMMUNITY): Payer: Self-pay

## 2014-07-24 ENCOUNTER — Other Ambulatory Visit: Payer: Self-pay

## 2014-07-24 ENCOUNTER — Encounter (HOSPITAL_COMMUNITY)
Admission: RE | Admit: 2014-07-24 | Discharge: 2014-07-24 | Disposition: A | Discharge status: Home / Self Care | Payer: BC Managed Care – PPO | Source: Ambulatory Visit | Attending: Orthopaedic Surgery | Admitting: Orthopaedic Surgery

## 2014-07-24 DIAGNOSIS — E780 Pure hypercholesterolemia: Secondary | ICD-10-CM | POA: Insufficient documentation

## 2014-07-24 DIAGNOSIS — J45909 Unspecified asthma, uncomplicated: Secondary | ICD-10-CM | POA: Diagnosis not present

## 2014-07-24 DIAGNOSIS — R9431 Abnormal electrocardiogram [ECG] [EKG]: Secondary | ICD-10-CM | POA: Insufficient documentation

## 2014-07-24 DIAGNOSIS — M419 Scoliosis, unspecified: Secondary | ICD-10-CM | POA: Diagnosis not present

## 2014-07-24 DIAGNOSIS — Z01818 Encounter for other preprocedural examination: Secondary | ICD-10-CM | POA: Insufficient documentation

## 2014-07-24 DIAGNOSIS — Z981 Arthrodesis status: Secondary | ICD-10-CM | POA: Insufficient documentation

## 2014-07-24 DIAGNOSIS — I10 Essential (primary) hypertension: Secondary | ICD-10-CM | POA: Insufficient documentation

## 2014-07-24 DIAGNOSIS — Z01812 Encounter for preprocedural laboratory examination: Secondary | ICD-10-CM | POA: Insufficient documentation

## 2014-07-24 DIAGNOSIS — Z96651 Presence of right artificial knee joint: Secondary | ICD-10-CM | POA: Diagnosis not present

## 2014-07-24 HISTORY — DX: Other somatoform disorders: F45.8

## 2014-07-24 HISTORY — DX: Localized edema: R60.0

## 2014-07-24 HISTORY — DX: Unspecified osteoarthritis, unspecified site: M19.90

## 2014-07-24 HISTORY — DX: Scoliosis, unspecified: M41.9

## 2014-07-24 HISTORY — DX: Bronchitis, not specified as acute or chronic: J40

## 2014-07-24 HISTORY — DX: Unspecified asthma, uncomplicated: J45.909

## 2014-07-24 LAB — COMPREHENSIVE METABOLIC PANEL
ALK PHOS: 108 U/L (ref 38–126)
ALT: 16 U/L (ref 14–54)
ANION GAP: 8 (ref 5–15)
AST: 22 U/L (ref 15–41)
Albumin: 3.8 g/dL (ref 3.5–5.0)
BUN: 17 mg/dL (ref 6–20)
CALCIUM: 9.2 mg/dL (ref 8.9–10.3)
CO2: 27 mmol/L (ref 22–32)
Chloride: 104 mmol/L (ref 101–111)
Creatinine, Ser: 0.81 mg/dL (ref 0.44–1.00)
GFR calc non Af Amer: >60 mL/min (ref >60)
Glucose, Bld: 111 mg/dL — ABNORMAL HIGH (ref 65–99)
Potassium: 4 mmol/L (ref 3.5–5.1)
Sodium: 139 mmol/L (ref 135–145)
TOTAL PROTEIN: 7 g/dL (ref 6.5–8.1)
Total Bilirubin: 0.6 mg/dL (ref 0.3–1.2)

## 2014-07-24 LAB — CBC
HCT: 45.2 % (ref 36.0–46.0)
Hemoglobin: 14.9 g/dL (ref 12.0–15.0)
MCH: 29.9 pg (ref 26.0–34.0)
MCHC: 33 g/dL (ref 30.0–36.0)
MCV: 90.8 fL (ref 78.0–100.0)
PLATELETS: 236 10*3/uL (ref 150–400)
RBC: 4.98 MIL/uL (ref 3.87–5.11)
RDW: 12.9 % (ref 11.5–15.5)
WBC: 9.6 10*3/uL (ref 4.0–10.5)

## 2014-07-24 LAB — SURGICAL PCR SCREEN
MRSA, PCR: NEGATIVE
Staphylococcus aureus: NEGATIVE

## 2014-07-24 NOTE — Progress Notes (Signed)
PCP is [name], PA-C.    Patient denied having any acute cardiac issues and informed Nurse that she last used Albuterol nebulizer > 1 year ago, and normally uses Ventolin inhaler around the end of August.   Patients daughter at chair side during PAT visit.   Joint booklet given, and joint video will be watched before leaving PAT.

## 2014-07-24 NOTE — Pre-Procedure Instructions (Signed)
[name]  07/24/2014     Your procedure is scheduled on : Tuesday August 04, 2014 at 3:28 PM.  Report to [facility] Admitting at [facility] PM.  Call this number if you have problems the morning of surgery: [phone]    Remember:  Do not eat food or drink liquids after midnight.  Take these medicines the morning of surgery with A SIP OF WATER : Albuterol inhaler/Nebulizer if needed, Felodipine (Plendil), Labetalol (Normodyne),    Please stop taking any vitamins, supplements, herbal medications, Fish oil, Ibuprofen, Advil, Motrin, Aleve, etc on Tuesday July 26th   Do not wear jewelry, make-up or nail polish.  Do not wear lotions, powders, or perfumes.    Do not shave 48 hours prior to surgery.    Do not bring valuables to the hospital.  [facility] is not responsible for any belongings or valuables.  Contacts, dentures or bridgework may not be worn into surgery.  Leave your suitcase in the car.  After surgery it may be brought to your room.  For patients admitted to the hospital, discharge time will be determined by your treatment team.  Patients discharged the day of surgery will not be allowed to drive home.   Name and phone number of your driver:    Special instructions:  Shower using CHG soap the night before and the morning of your surgery  Please read over the following fact sheets that you were given. Pain Booklet, Coughing and Deep Breathing, Total Joint Packet, MRSA Information and Surgical Site Infection Prevention

## 2014-07-27 ENCOUNTER — Encounter (HOSPITAL_COMMUNITY): Payer: Self-pay | Admitting: Vascular Surgery

## 2014-07-27 NOTE — Progress Notes (Signed)
Anesthesia Chart Review: Patient is a 64 year old female scheduled for right THA on 08/04/14 by Dr. Kathrynn Speed.  History includes non-smoker, HTN, hypercholesterolemia, scoliosis, asthma, teeth grinding, LE edema (Lasix PRN), T&A, cervical (ACDF) and lumbar surgeries, right TKA '09. BMI is consistent with morbid obesity. PCP is listed as [name], PA-C.  07/24/14 EKG: NSR, possible anterior infarct (age undetermined). Currently, there is no comparison EKG available. No CV symptoms documented from her PAT visit. No known DM, smoking, or CAD/MI history.   Preoperative labs noted.  If she remains asymptomatic from a CV standpoint then I would anticipate that she could proceed as planned.   George Hugh [facility] Stay [facility]/Anesthesiology Phone [phone] 07/27/2014 6:20 PM

## 2014-07-27 NOTE — Progress Notes (Signed)
Patient called with additional questions regarding supplements. Recommended that patient stop cinnamon and chromium tomorrow.

## 2014-08-03 MED ORDER — TRANEXAMIC ACID 1000 MG/10ML IV SOLN
1000.0000 mg | INTRAVENOUS | Status: DC
  Filled 2014-08-03: qty 10

## 2014-08-03 MED ORDER — CEFAZOLIN SODIUM-DEXTROSE 2-3 GM-% IV SOLR: 2.0000 g | INTRAVENOUS | Status: DC

## 2014-08-04 ENCOUNTER — Inpatient Hospital Stay (HOSPITAL_COMMUNITY)
Admission: RE | Admit: 2014-08-04 | Payer: BC Managed Care – PPO | Source: Ambulatory Visit | Admitting: Orthopaedic Surgery

## 2014-08-04 ENCOUNTER — Encounter (HOSPITAL_COMMUNITY): Admission: RE | Payer: Self-pay | Source: Ambulatory Visit

## 2014-08-04 SURGERY — ARTHROPLASTY, HIP, TOTAL, ANTERIOR APPROACH
Anesthesia: Choice | Laterality: Right

## 2014-08-21 ENCOUNTER — Other Ambulatory Visit (HOSPITAL_COMMUNITY): Payer: Self-pay | Admitting: Orthopaedic Surgery

## 2014-08-28 ENCOUNTER — Encounter (HOSPITAL_COMMUNITY)
Admission: RE | Admit: 2014-08-28 | Discharge: 2014-08-28 | Disposition: A | Discharge status: Home / Self Care | Payer: BC Managed Care – PPO | Source: Ambulatory Visit | Attending: Orthopaedic Surgery | Admitting: Orthopaedic Surgery

## 2014-08-28 ENCOUNTER — Encounter (HOSPITAL_COMMUNITY): Payer: Self-pay

## 2014-08-28 ENCOUNTER — Telehealth: Payer: Self-pay | Admitting: Nurse Practitioner

## 2014-08-28 DIAGNOSIS — Z01812 Encounter for preprocedural laboratory examination: Secondary | ICD-10-CM | POA: Insufficient documentation

## 2014-08-28 DIAGNOSIS — M1611 Unilateral primary osteoarthritis, right hip: Secondary | ICD-10-CM | POA: Diagnosis not present

## 2014-08-28 LAB — BASIC METABOLIC PANEL
Anion gap: 8 (ref 5–15)
BUN: 13 mg/dL (ref 6–20)
CHLORIDE: 105 mmol/L (ref 101–111)
CO2: 26 mmol/L (ref 22–32)
Calcium: 9 mg/dL (ref 8.9–10.3)
Creatinine, Ser: 0.61 mg/dL (ref 0.44–1.00)
GFR calc Af Amer: >60 mL/min (ref >60)
GLUCOSE: 103 mg/dL — AB (ref 65–99)
POTASSIUM: 4.2 mmol/L (ref 3.5–5.1)
Sodium: 139 mmol/L (ref 135–145)

## 2014-08-28 LAB — CBC
HEMATOCRIT: 41.5 % (ref 36.0–46.0)
Hemoglobin: 13.1 g/dL (ref 12.0–15.0)
MCH: 28.7 pg (ref 26.0–34.0)
MCHC: 31.6 g/dL (ref 30.0–36.0)
MCV: 91 fL (ref 78.0–100.0)
Platelets: 189 10*3/uL (ref 150–400)
RBC: 4.56 MIL/uL (ref 3.87–5.11)
RDW: 13.4 % (ref 11.5–15.5)
WBC: 6.4 10*3/uL (ref 4.0–10.5)

## 2014-08-28 LAB — SURGICAL PCR SCREEN
MRSA, PCR: NEGATIVE
STAPHYLOCOCCUS AUREUS: NEGATIVE

## 2014-08-28 NOTE — Telephone Encounter (Signed)
Yes OK to wait on Provera challenge until October - just keep menses record and let us know if heavy or prolonged bleeding.

## 2014-08-28 NOTE — Pre-Procedure Instructions (Signed)
[name]  08/28/2014     Your procedure is scheduled on : Tuesday September 08, 2014 at 1:20 PM.  Report to [facility] Admitting at 11:20 A.M.  Call this number if you have problems the morning of surgery: [phone]   Remember:  Do not eat food or drink liquids after midnight.  Take these medicines the morning of surgery with A SIP OF WATER : Albuterol inhaler/Nebulizer if needed, Felodipine (Plendil), Labetalol    Stop taking ALL vitamins, herbal medications, Cinnamon, Diclofenac/Voltaren, Flaxseed, Garlic, Grape seed, Lutein, Fish Oil, etc on Tuesday August 30th   Do not wear jewelry, make-up or nail polish.  Do not wear lotions, powders, or perfumes.    Do not shave 48 hours prior to surgery.    Do not bring valuables to the hospital.  [facility] is not responsible for any belongings or valuables.  Contacts, dentures or bridgework may not be worn into surgery.  Leave your suitcase in the car.  After surgery it may be brought to your room.  For patients admitted to the hospital, discharge time will be determined by your treatment team.  Patients discharged the day of surgery will not be allowed to drive home.   Name and phone number of your driver:    Special instructions:  Shower using CHG soap the night before and the morning of your surgery  Please read over the following fact sheets that you were given. Pain Booklet, Coughing and Deep Breathing, MRSA Information and Surgical Site Infection Prevention

## 2014-08-28 NOTE — Progress Notes (Signed)
PCP is [name]  Nurse last saw patient on 07/24/14, however patients surgery was canceled due to a stress fracture she developed on her left foot. Patient arrived to PAT via wheelchair with her daughter [name].  Patient denied having any acute cardiac or pulmonary issues

## 2014-08-28 NOTE — Telephone Encounter (Signed)
Spoke with patient. Patient states that she was scheduled to have a hip replacement this month, but it had to be postponed due to having a stress fracture in her foot. Patient is now scheduled for a surgery on 09/08/2014. States she is due to take her Provera challenge in September. Is doing this every 6 months now. Patient does not want to take this at the same time of recovering from hip surgery. "I am just worried if I have a heavy cycle which never happens, but it may. I would be dealing with bleeding and trying to get better." Patient would like to know if she can wait until October to take Provera. Advised I will speak with Milford Cage, FNP and return call with further recommendations. Patient is agreeable.

## 2014-08-28 NOTE — Telephone Encounter (Signed)
Patient calling with questions about her next dose of progesterone.

## 2014-08-31 NOTE — Telephone Encounter (Signed)
Spoke with patient. Advised of message as seen below from Patricia Rolen-Grubb, FNP. Patient is agreeable and verbalizes understanding.  Routing to provider for final review. Patient agreeable to disposition. Will close encounter.     

## 2014-09-08 ENCOUNTER — Inpatient Hospital Stay (HOSPITAL_COMMUNITY): Payer: BC Managed Care – PPO

## 2014-09-08 ENCOUNTER — Inpatient Hospital Stay (HOSPITAL_COMMUNITY)
Admission: RE | Admit: 2014-09-08 | Discharge: 2014-09-11 | DRG: 470 | Disposition: A | Discharge status: Skilled Nursing Facility | Payer: BC Managed Care – PPO | Source: Ambulatory Visit | Attending: Orthopaedic Surgery | Admitting: Orthopaedic Surgery

## 2014-09-08 ENCOUNTER — Inpatient Hospital Stay (HOSPITAL_COMMUNITY): Payer: BC Managed Care – PPO | Admitting: Vascular Surgery

## 2014-09-08 ENCOUNTER — Encounter (HOSPITAL_COMMUNITY)
Admission: RE | Disposition: A | Discharge status: Skilled Nursing Facility | Payer: Self-pay | Source: Ambulatory Visit | Attending: Orthopaedic Surgery

## 2014-09-08 ENCOUNTER — Encounter (HOSPITAL_COMMUNITY): Payer: Self-pay | Admitting: Certified Registered"

## 2014-09-08 ENCOUNTER — Inpatient Hospital Stay (HOSPITAL_COMMUNITY): Payer: BC Managed Care – PPO | Admitting: Certified Registered"

## 2014-09-08 DIAGNOSIS — Z791 Long term (current) use of non-steroidal anti-inflammatories (NSAID): Secondary | ICD-10-CM

## 2014-09-08 DIAGNOSIS — J45909 Unspecified asthma, uncomplicated: Secondary | ICD-10-CM | POA: Diagnosis present

## 2014-09-08 DIAGNOSIS — Z419 Encounter for procedure for purposes other than remedying health state, unspecified: Secondary | ICD-10-CM

## 2014-09-08 DIAGNOSIS — Z96641 Presence of right artificial hip joint: Secondary | ICD-10-CM

## 2014-09-08 DIAGNOSIS — E780 Pure hypercholesterolemia: Secondary | ICD-10-CM | POA: Diagnosis present

## 2014-09-08 DIAGNOSIS — Z79899 Other long term (current) drug therapy: Secondary | ICD-10-CM

## 2014-09-08 DIAGNOSIS — Z885 Allergy status to narcotic agent status: Secondary | ICD-10-CM

## 2014-09-08 DIAGNOSIS — M1611 Unilateral primary osteoarthritis, right hip: Principal | ICD-10-CM

## 2014-09-08 DIAGNOSIS — I10 Essential (primary) hypertension: Secondary | ICD-10-CM | POA: Diagnosis present

## 2014-09-08 DIAGNOSIS — D62 Acute posthemorrhagic anemia: Secondary | ICD-10-CM | POA: Diagnosis not present

## 2014-09-08 DIAGNOSIS — Z6841 Body Mass Index (BMI) 40.0 and over, adult: Secondary | ICD-10-CM | POA: Diagnosis not present

## 2014-09-08 HISTORY — PX: TOTAL HIP ARTHROPLASTY: SHX124

## 2014-09-08 SURGERY — ARTHROPLASTY, HIP, TOTAL, ANTERIOR APPROACH
Anesthesia: Choice | Site: Hip | Laterality: Right

## 2014-09-08 MED ORDER — CEFAZOLIN SODIUM-DEXTROSE 2-3 GM-% IV SOLR
INTRAVENOUS | Status: AC
  Administered 2014-09-08: 2 g via INTRAVENOUS
  Filled 2014-09-08: qty 50

## 2014-09-08 MED ORDER — ALUM & MAG HYDROXIDE-SIMETH 200-200-20 MG/5ML PO SUSP: 30.0000 mL | ORAL | Status: DC | PRN

## 2014-09-08 MED ORDER — MIDAZOLAM HCL 5 MG/5ML IJ SOLN
INTRAMUSCULAR | Status: DC | PRN
  Administered 2014-09-08: 2 mg via INTRAVENOUS

## 2014-09-08 MED ORDER — LIDOCAINE HCL (CARDIAC) 20 MG/ML IV SOLN
INTRAVENOUS | Status: AC
  Filled 2014-09-08: qty 5

## 2014-09-08 MED ORDER — ASPIRIN EC 325 MG PO TBEC
325.0000 mg | DELAYED_RELEASE_TABLET | Freq: Two times a day (BID) | ORAL | Status: DC
  Administered 2014-09-08 – 2014-09-11 (×6): 325 mg via ORAL
  Filled 2014-09-08 (×7): qty 1

## 2014-09-08 MED ORDER — PHENOL 1.4 % MT LIQD: 1.0000 | OROMUCOSAL | Status: DC | PRN

## 2014-09-08 MED ORDER — FENTANYL CITRATE (PF) 100 MCG/2ML IJ SOLN
INTRAMUSCULAR | Status: DC | PRN
  Administered 2014-09-08 (×2): 50 ug via INTRAVENOUS
  Administered 2014-09-08: 150 ug via INTRAVENOUS

## 2014-09-08 MED ORDER — LIDOCAINE HCL (CARDIAC) 20 MG/ML IV SOLN
INTRAVENOUS | Status: DC | PRN
  Administered 2014-09-08: 100 mg via INTRAVENOUS

## 2014-09-08 MED ORDER — GLYCOPYRROLATE 0.2 MG/ML IJ SOLN
INTRAMUSCULAR | Status: AC
  Filled 2014-09-08: qty 2

## 2014-09-08 MED ORDER — ROCURONIUM BROMIDE 100 MG/10ML IV SOLN
INTRAVENOUS | Status: DC | PRN
  Administered 2014-09-08: 40 mg via INTRAVENOUS
  Administered 2014-09-08: 10 mg via INTRAVENOUS

## 2014-09-08 MED ORDER — ACETAMINOPHEN 650 MG RE SUPP: 650.0000 mg | Freq: Four times a day (QID) | RECTAL | Status: DC | PRN

## 2014-09-08 MED ORDER — METHOCARBAMOL 1000 MG/10ML IJ SOLN
500.0000 mg | Freq: Four times a day (QID) | INTRAVENOUS | Status: DC | PRN
  Filled 2014-09-08: qty 5

## 2014-09-08 MED ORDER — PROPOFOL 10 MG/ML IV BOLUS
INTRAVENOUS | Status: DC | PRN
  Administered 2014-09-08: 200 mg via INTRAVENOUS

## 2014-09-08 MED ORDER — NEOSTIGMINE METHYLSULFATE 10 MG/10ML IV SOLN
INTRAVENOUS | Status: AC
  Filled 2014-09-08: qty 1

## 2014-09-08 MED ORDER — METOCLOPRAMIDE HCL 5 MG/ML IJ SOLN: 5.0000 mg | Freq: Three times a day (TID) | INTRAMUSCULAR | Status: DC | PRN

## 2014-09-08 MED ORDER — ONDANSETRON HCL 4 MG/2ML IJ SOLN
4.0000 mg | Freq: Four times a day (QID) | INTRAMUSCULAR | Status: DC | PRN
  Administered 2014-09-09: 4 mg via INTRAVENOUS
  Filled 2014-09-08: qty 2

## 2014-09-08 MED ORDER — 0.9 % SODIUM CHLORIDE (POUR BTL) OPTIME
TOPICAL | Status: DC | PRN
  Administered 2014-09-08: 1000 mL

## 2014-09-08 MED ORDER — COQ10 200 MG PO CAPS: 200.0000 mg | ORAL_CAPSULE | Freq: Every evening | ORAL | Status: DC

## 2014-09-08 MED ORDER — PROMETHAZINE HCL 25 MG/ML IJ SOLN: 6.2500 mg | INTRAMUSCULAR | Status: DC | PRN

## 2014-09-08 MED ORDER — ONDANSETRON HCL 4 MG PO TABS: 4.0000 mg | ORAL_TABLET | Freq: Four times a day (QID) | ORAL | Status: DC | PRN

## 2014-09-08 MED ORDER — HYDROMORPHONE HCL 1 MG/ML IJ SOLN
1.0000 mg | INTRAMUSCULAR | Status: DC | PRN
  Administered 2014-09-08: 1 mg via INTRAVENOUS
  Filled 2014-09-08: qty 1

## 2014-09-08 MED ORDER — LUTEIN 20 MG PO CAPS: 20.0000 mg | ORAL_CAPSULE | Freq: Every day | ORAL | Status: DC

## 2014-09-08 MED ORDER — TRANEXAMIC ACID 1000 MG/10ML IV SOLN
1000.0000 mg | INTRAVENOUS | Status: AC
  Administered 2014-09-08: 1000 mg via INTRAVENOUS
  Filled 2014-09-08: qty 10

## 2014-09-08 MED ORDER — POLYETHYLENE GLYCOL 3350 17 G PO PACK
17.0000 g | PACK | Freq: Every day | ORAL | Status: DC | PRN
  Administered 2014-09-09: 17 g via ORAL
  Filled 2014-09-08: qty 1

## 2014-09-08 MED ORDER — IRBESARTAN 300 MG PO TABS
300.0000 mg | ORAL_TABLET | Freq: Every day | ORAL | Status: DC
  Administered 2014-09-08 – 2014-09-11 (×4): 300 mg via ORAL
  Filled 2014-09-08 (×4): qty 1

## 2014-09-08 MED ORDER — CEFAZOLIN SODIUM 1-5 GM-% IV SOLN
1.0000 g | Freq: Four times a day (QID) | INTRAVENOUS | Status: AC
  Administered 2014-09-08 – 2014-09-09 (×2): 1 g via INTRAVENOUS
  Filled 2014-09-08 (×2): qty 50

## 2014-09-08 MED ORDER — MENTHOL 3 MG MT LOZG: 1.0000 | LOZENGE | OROMUCOSAL | Status: DC | PRN

## 2014-09-08 MED ORDER — ONDANSETRON HCL 4 MG/2ML IJ SOLN
INTRAMUSCULAR | Status: AC
  Filled 2014-09-08: qty 2

## 2014-09-08 MED ORDER — NEOSTIGMINE METHYLSULFATE 10 MG/10ML IV SOLN
INTRAVENOUS | Status: DC | PRN
  Administered 2014-09-08: 3 mg via INTRAVENOUS

## 2014-09-08 MED ORDER — PROPOFOL 10 MG/ML IV BOLUS
INTRAVENOUS | Status: AC
  Filled 2014-09-08: qty 20

## 2014-09-08 MED ORDER — HYDROMORPHONE HCL 1 MG/ML IJ SOLN
0.2500 mg | INTRAMUSCULAR | Status: DC | PRN
  Administered 2014-09-08 (×2): 0.5 mg via INTRAVENOUS

## 2014-09-08 MED ORDER — FELODIPINE ER 10 MG PO TB24
10.0000 mg | ORAL_TABLET | Freq: Every day | ORAL | Status: DC
  Administered 2014-09-09 – 2014-09-11 (×3): 10 mg via ORAL
  Filled 2014-09-08 (×3): qty 1

## 2014-09-08 MED ORDER — FENTANYL CITRATE (PF) 100 MCG/2ML IJ SOLN
INTRAMUSCULAR | Status: AC
  Administered 2014-09-08: 50 ug
  Filled 2014-09-08: qty 2

## 2014-09-08 MED ORDER — GLYCOPYRROLATE 0.2 MG/ML IJ SOLN
INTRAMUSCULAR | Status: DC | PRN
  Administered 2014-09-08: 0.4 mg via INTRAVENOUS

## 2014-09-08 MED ORDER — METHOCARBAMOL 500 MG PO TABS
500.0000 mg | ORAL_TABLET | Freq: Four times a day (QID) | ORAL | Status: DC | PRN
  Administered 2014-09-08 – 2014-09-10 (×6): 500 mg via ORAL
  Filled 2014-09-08 (×6): qty 1

## 2014-09-08 MED ORDER — METOCLOPRAMIDE HCL 5 MG PO TABS: 5.0000 mg | ORAL_TABLET | Freq: Three times a day (TID) | ORAL | Status: DC | PRN

## 2014-09-08 MED ORDER — DOCUSATE SODIUM 100 MG PO CAPS
100.0000 mg | ORAL_CAPSULE | Freq: Two times a day (BID) | ORAL | Status: DC
  Administered 2014-09-08 – 2014-09-11 (×6): 100 mg via ORAL
  Filled 2014-09-08 (×6): qty 1

## 2014-09-08 MED ORDER — CEFAZOLIN SODIUM-DEXTROSE 2-3 GM-% IV SOLR: 2.0000 g | INTRAVENOUS | Status: DC

## 2014-09-08 MED ORDER — OXYCODONE HCL 5 MG PO TABS
5.0000 mg | ORAL_TABLET | ORAL | Status: DC | PRN
  Administered 2014-09-08 – 2014-09-09 (×7): 10 mg via ORAL
  Administered 2014-09-10 – 2014-09-11 (×3): 5 mg via ORAL
  Filled 2014-09-08: qty 2
  Filled 2014-09-08 (×2): qty 1
  Filled 2014-09-08 (×3): qty 2
  Filled 2014-09-08: qty 1
  Filled 2014-09-08 (×3): qty 2

## 2014-09-08 MED ORDER — ONDANSETRON HCL 4 MG/2ML IJ SOLN
INTRAMUSCULAR | Status: DC | PRN
  Administered 2014-09-08: 4 mg via INTRAVENOUS

## 2014-09-08 MED ORDER — ACETAMINOPHEN 325 MG PO TABS
650.0000 mg | ORAL_TABLET | Freq: Four times a day (QID) | ORAL | Status: DC | PRN
  Administered 2014-09-09 (×2): 650 mg via ORAL
  Filled 2014-09-08 (×2): qty 2

## 2014-09-08 MED ORDER — MIDAZOLAM HCL 2 MG/2ML IJ SOLN
INTRAMUSCULAR | Status: AC
  Filled 2014-09-08: qty 4

## 2014-09-08 MED ORDER — IPRATROPIUM-ALBUTEROL 0.5-2.5 (3) MG/3ML IN SOLN: 3.0000 mL | RESPIRATORY_TRACT | Status: DC | PRN

## 2014-09-08 MED ORDER — LACTATED RINGERS IV SOLN
INTRAVENOUS | Status: DC
  Administered 2014-09-08 (×2): via INTRAVENOUS

## 2014-09-08 MED ORDER — FENTANYL CITRATE (PF) 250 MCG/5ML IJ SOLN
INTRAMUSCULAR | Status: AC
  Filled 2014-09-08: qty 5

## 2014-09-08 MED ORDER — VITAMIN D 1000 UNITS PO TABS
1000.0000 [IU] | ORAL_TABLET | Freq: Two times a day (BID) | ORAL | Status: DC
  Administered 2014-09-08 – 2014-09-11 (×6): 1000 [IU] via ORAL
  Filled 2014-09-08 (×4): qty 1

## 2014-09-08 MED ORDER — MEPERIDINE HCL 25 MG/ML IJ SOLN: 6.2500 mg | INTRAMUSCULAR | Status: DC | PRN

## 2014-09-08 MED ORDER — ALBUTEROL (5 MG/ML) CONTINUOUS INHALATION SOLN
3.0000 mL | INHALATION_SOLUTION | Freq: Four times a day (QID) | RESPIRATORY_TRACT | Status: DC | PRN
  Filled 2014-09-08: qty 20

## 2014-09-08 MED ORDER — SIMVASTATIN 20 MG PO TABS
20.0000 mg | ORAL_TABLET | Freq: Every evening | ORAL | Status: DC
  Administered 2014-09-08 – 2014-09-10 (×3): 20 mg via ORAL
  Filled 2014-09-08 (×3): qty 1

## 2014-09-08 MED ORDER — HYDROMORPHONE HCL 1 MG/ML IJ SOLN
INTRAMUSCULAR | Status: AC
  Filled 2014-09-08: qty 1

## 2014-09-08 MED ORDER — FUROSEMIDE 20 MG PO TABS: 20.0000 mg | ORAL_TABLET | Freq: Every day | ORAL | Status: DC | PRN

## 2014-09-08 MED ORDER — LABETALOL HCL 200 MG PO TABS
400.0000 mg | ORAL_TABLET | Freq: Two times a day (BID) | ORAL | Status: DC
  Administered 2014-09-08 – 2014-09-11 (×6): 400 mg via ORAL
  Filled 2014-09-08 (×7): qty 2

## 2014-09-08 MED ORDER — ROCURONIUM BROMIDE 50 MG/5ML IV SOLN
INTRAVENOUS | Status: AC
  Filled 2014-09-08: qty 1

## 2014-09-08 MED ORDER — SODIUM CHLORIDE 0.9 % IV SOLN
INTRAVENOUS | Status: DC
  Administered 2014-09-08: 20:00:00 via INTRAVENOUS

## 2014-09-08 MED ORDER — FENTANYL CITRATE (PF) 100 MCG/2ML IJ SOLN
100.0000 ug | Freq: Once | INTRAMUSCULAR | Status: AC
  Administered 2014-09-08: 50 ug via INTRAVENOUS

## 2014-09-08 SURGICAL SUPPLY — 55 items
APL SKNCLS STERI-STRIP NONHPOA (GAUZE/BANDAGES/DRESSINGS) ×1
BENZOIN TINCTURE PRP APPL 2/3 (GAUZE/BANDAGES/DRESSINGS) ×3 IMPLANT
BLADE SAW SGTL 18X1.27X75 (BLADE) ×2 IMPLANT
BLADE SAW SGTL 18X1.27X75MM (BLADE) ×1
BLADE SURG ROTATE 9660 (MISCELLANEOUS) IMPLANT
CAPT HIP TOTAL 2 ×2 IMPLANT
CELLS DAT CNTRL 66122 CELL SVR (MISCELLANEOUS) ×1 IMPLANT
CLOSURE WOUND 1/2 X4 (GAUZE/BANDAGES/DRESSINGS) ×2
COVER SURGICAL LIGHT HANDLE (MISCELLANEOUS) ×3 IMPLANT
DRAPE C-ARM 42X72 X-RAY (DRAPES) ×3 IMPLANT
DRAPE STERI IOBAN 125X83 (DRAPES) ×3 IMPLANT
DRAPE U-SHAPE 47X51 STRL (DRAPES) ×9 IMPLANT
DRSG AQUACEL AG ADV 3.5X10 (GAUZE/BANDAGES/DRESSINGS) ×3 IMPLANT
DURAPREP 26ML APPLICATOR (WOUND CARE) ×3 IMPLANT
ELECT BLADE 4.0 EZ CLEAN MEGAD (MISCELLANEOUS) ×3
ELECT BLADE 6.5 EXT (BLADE) IMPLANT
ELECT REM PT RETURN 9FT ADLT (ELECTROSURGICAL) ×3
ELECTRODE BLDE 4.0 EZ CLN MEGD (MISCELLANEOUS) ×1 IMPLANT
ELECTRODE REM PT RTRN 9FT ADLT (ELECTROSURGICAL) ×1 IMPLANT
FACESHIELD WRAPAROUND (MASK) ×6 IMPLANT
FACESHIELD WRAPAROUND OR TEAM (MASK) ×2 IMPLANT
GAUZE XEROFORM 5X9 LF (GAUZE/BANDAGES/DRESSINGS) ×2 IMPLANT
GLOVE BIOGEL PI IND STRL 8 (GLOVE) ×2 IMPLANT
GLOVE BIOGEL PI INDICATOR 8 (GLOVE) ×4
GLOVE ECLIPSE 8.0 STRL XLNG CF (GLOVE) ×3 IMPLANT
GLOVE ORTHO TXT STRL SZ7.5 (GLOVE) ×6 IMPLANT
GOWN STRL REUS W/ TWL LRG LVL3 (GOWN DISPOSABLE) ×2 IMPLANT
GOWN STRL REUS W/ TWL XL LVL3 (GOWN DISPOSABLE) ×2 IMPLANT
GOWN STRL REUS W/TWL LRG LVL3 (GOWN DISPOSABLE) ×6
GOWN STRL REUS W/TWL XL LVL3 (GOWN DISPOSABLE) ×6
HANDPIECE INTERPULSE COAX TIP (DISPOSABLE) ×3
KIT BASIN OR (CUSTOM PROCEDURE TRAY) ×3 IMPLANT
KIT ROOM TURNOVER OR (KITS) ×3 IMPLANT
MANIFOLD NEPTUNE II (INSTRUMENTS) ×3 IMPLANT
NS IRRIG 1000ML POUR BTL (IV SOLUTION) ×3 IMPLANT
PACK TOTAL JOINT (CUSTOM PROCEDURE TRAY) ×3 IMPLANT
PACK UNIVERSAL I (CUSTOM PROCEDURE TRAY) ×3 IMPLANT
PAD ARMBOARD 7.5X6 YLW CONV (MISCELLANEOUS) ×3 IMPLANT
RETRACTOR WND ALEXIS 18 MED (MISCELLANEOUS) ×1 IMPLANT
RTRCTR WOUND ALEXIS 18CM MED (MISCELLANEOUS) ×3
SET HNDPC FAN SPRY TIP SCT (DISPOSABLE) ×1 IMPLANT
STAPLER VISISTAT 35W (STAPLE) IMPLANT
STRIP CLOSURE SKIN 1/2X4 (GAUZE/BANDAGES/DRESSINGS) ×4 IMPLANT
SUT ETHIBOND NAB CT1 #1 30IN (SUTURE) ×3 IMPLANT
SUT MNCRL AB 4-0 PS2 18 (SUTURE) IMPLANT
SUT VIC AB 0 CT1 27 (SUTURE) ×3
SUT VIC AB 0 CT1 27XBRD ANBCTR (SUTURE) ×1 IMPLANT
SUT VIC AB 1 CT1 27 (SUTURE) ×3
SUT VIC AB 1 CT1 27XBRD ANBCTR (SUTURE) ×1 IMPLANT
SUT VIC AB 2-0 CT1 27 (SUTURE) ×3
SUT VIC AB 2-0 CT1 TAPERPNT 27 (SUTURE) ×1 IMPLANT
TOWEL OR 17X24 6PK STRL BLUE (TOWEL DISPOSABLE) ×3 IMPLANT
TOWEL OR 17X26 10 PK STRL BLUE (TOWEL DISPOSABLE) ×3 IMPLANT
TRAY FOLEY CATH 16FRSI W/METER (SET/KITS/TRAYS/PACK) IMPLANT
WATER STERILE IRR 1000ML POUR (IV SOLUTION) ×6 IMPLANT

## 2014-09-08 NOTE — H&P (Signed)
TOTAL HIP ADMISSION H&P  Patient is admitted for right total hip arthroplasty.  Subjective:  Chief Complaint: right hip pain  HPI: [name], 64 y.o. female, has a history of pain and functional disability in the right hip(s) due to arthritis and patient has failed non-surgical conservative treatments for greater than 12 weeks to include NSAID's and/or analgesics and corticosteriod injections.  Onset of symptoms was gradual starting 3 years ago with gradually worsening course since that time.The patient noted no past surgery on the right hip(s).  Patient currently rates pain in the right hip at 10 out of 10 with activity. Patient has night pain, worsening of pain with activity and weight bearing, trendelenberg gait, pain that interfers with activities of daily living, pain with passive range of motion and crepitus. Patient has evidence of subchondral cysts, subchondral sclerosis, periarticular osteophytes and joint space narrowing by imaging studies. This condition presents safety issues increasing the risk of falls.  There is no current active infection.  Patient Active Problem List   Diagnosis Date Noted  . Osteoarthritis of right hip 09/08/2014  . PMB (postmenopausal bleeding) 02/28/2013  . Morbid obesity 02/28/2013  . Pure hypercholesterolemia 02/28/2013  . Essential hypertension, benign 02/28/2013   Past Medical History  Diagnosis Date  . Obesity   . Hypercholesteremia   . Hypertension since 1990's  . Abnormal Pap smear of cervix 03/2009    Colpo Biopsy CIN I with HPV  . Scoliosis   . Asthma   . Bronchitis   . Bilateral edema of lower extremity     Takes lasix if needed  . Arthritis   . Teeth grinding   . Complication of anesthesia     Pt had some asthma attack after having cervical surgery in 2007    Past Surgical History  Procedure Laterality Date  . Tonsillectomy and adenoidectomy    . Knee arthroscopy Right 1990  . Total knee arthroplasty Right 2009  . Shoulder  arthroscopy Left 1994    arthritis, hips,fingers  . Appendectomy  1965  . Lumbar disc surgery  2006  . Cervical disc surgery  2007  . Back surgery  1993    Thoracic and Lumbar  . Hernia repair  1977, as child    bilateral inguinal -1 during pregnancy.  Umbilical repair as child  . Colonoscopy w/ polypectomy    . Breast biopsy Right 1990    benign cyst    Prescriptions prior to admission  Medication Sig Dispense Refill Last Dose  . albuterol (PROVENTIL HFA;VENTOLIN HFA) 108 (90 BASE) MCG/ACT inhaler Inhale 2 puffs into the lungs every 6 (six) hours as needed for wheezing or shortness of breath.   Past Week at Unknown time  . Cinnamon 500 MG TABS Take 500 mg by mouth 2 (two) times daily.   Past Week at Unknown time  . Coenzyme Q10 (COQ10) 200 MG CAPS Take 200 mg by mouth every evening.    Past Week at Unknown time  . cyclobenzaprine (FLEXERIL) 10 MG tablet Take 10 mg by mouth 2 (two) times daily as needed for muscle spasms.    Past Month at Unknown time  . diclofenac (VOLTAREN) 75 MG EC tablet Take 75 mg by mouth 2 (two) times daily as needed for moderate pain.    Past Week at Unknown time  . Docusate Calcium (STOOL SOFTENER PO) Take 1 tablet by mouth daily.    09/07/2014 at Unknown time  . felodipine (PLENDIL) 10 MG 24 hr tablet Take 10  mg by mouth daily.   09/08/2014 at [place]  . Flaxseed, Linseed, (FLAX SEED OIL PO) Take 1 capsule by mouth daily.    Past Week at Unknown time  . furosemide (LASIX) 20 MG tablet Take 20-30 mg by mouth daily as needed for fluid.    09/07/2014 at Unknown time  . GARLIC PO Take 3,244 mg by mouth daily.    Past Week at Unknown time  . Grape Seed OIL Take 100 mg by mouth daily.    Past Week at Unknown time  . HYDROcodone-acetaminophen (NORCO/VICODIN) 5-325 MG per tablet Take 1 tablet by mouth every 6 (six) hours as needed for moderate pain.   09/08/2014 at 0945  . irbesartan (AVAPRO) 300 MG tablet Take 300 mg by mouth daily.   09/07/2014 at Unknown time  . labetalol  (NORMODYNE) 200 MG tablet Take 400 mg by mouth 2 (two) times daily.    09/08/2014 at [place]  . lidocaine-hydrocortisone (LIDAZONE HC) 3-0.5 % CREA Place 1 Applicatorful rectally as needed (hemorrhoids).    Taking  . Lutein 20 MG CAPS Take 20 mg by mouth daily.    Past Week at Unknown time  . Multiple Vitamin (DAILY MULTIVITAMIN PO) Take 0.5 tablets by mouth 2 (two) times daily. With D   Past Week at Unknown time  . Omega-3 Fatty Acids (FISH OIL) 1200 MG CAPS Take 1,200 mg by mouth 2 (two) times daily.    Past Week at Unknown time  . Probiotic Product (PROBIOTIC DAILY PO) Take 1 capsule by mouth daily.    09/07/2014 at Unknown time  . simvastatin (ZOCOR) 20 MG tablet Take 20 mg by mouth every evening.    09/07/2014 at Unknown time  . triamcinolone cream (KENALOG) 0.1 % Apply 1 application topically 2 (two) times daily.   Past Month at Unknown time  . UNABLE TO FIND Take 1,200 mg by mouth daily. Med Name: [name]   Past Week at Unknown time  . Vitamin D, Cholecalciferol, 1000 UNITS TABS Take 1,000 Units by mouth 2 (two) times daily.    Past Week at Unknown time  . ipratropium-albuterol (DUONEB) 0.5-2.5 (3) MG/3ML SOLN Take 3 mLs by nebulization every 4 (four) hours as needed (shortness of breath).    Unknown at Unknown time  . medroxyPROGESTERone (PROVERA) 10 MG tablet Take 1 tablet (10 mg total) by mouth daily. (Patient taking differently: Take 10 mg by mouth See admin instructions. 10mg  once a day for 10 days every 6 months) 30 tablet 3 More than a month at Unknown time   Allergies  Allergen Reactions  . Lortab [Hydrocodone-Acetaminophen] Nausea Only and Rash    Social History  Substance Use Topics  . Smoking status: Never Smoker   . Smokeless tobacco: Never Used  . Alcohol Use: Yes     Comment: occasional    Family History  Problem Relation Age of Onset  . Breast cancer Sister 17    bilateral mastectomy- Breast cancer   . Hypertension Mother   . Heart disease Mother   . Stroke Mother   .  Heart disease Father   . Depression Father   . Alcohol abuse Father   . Stroke Maternal Grandmother   . Depression Daughter   . Emphysema Paternal Grandmother   . Breast cancer Maternal Aunt     Both aunts in 55 -2's  . Heart failure Paternal Grandfather   . Alcohol abuse Paternal Grandfather      Review of Systems  Musculoskeletal: Positive for  joint pain.  All other systems reviewed and are negative.   Objective:  Physical Exam  Constitutional: She is oriented to person, place, and time. She appears well-developed and well-nourished.  HENT:  Head: Normocephalic and atraumatic.  Eyes: Conjunctivae are normal. Pupils are equal, round, and reactive to light.  Neck: Normal range of motion. Neck supple.  Cardiovascular: Normal rate.   Respiratory: Effort normal and breath sounds normal.  GI: Soft. Bowel sounds are normal.  Musculoskeletal:       Right hip: She exhibits decreased range of motion, decreased strength, tenderness and bony tenderness.  Neurological: She is alert and oriented to person, place, and time.  Skin: Skin is warm and dry.  Psychiatric: She has a normal mood and affect.    Vital signs in last 24 hours: Temp:  [97.3 F (36.3 C)] 97.3 F (36.3 C) (09/06 1119) Pulse Rate:  [76] 76 (09/06 1119) Resp:  [16] 16 (09/06 1119) BP: (120)/(42) 120/42 mmHg (09/06 1119) SpO2:  [90 %] 90 % (09/06 1119) Weight:  [110.099 kg (242 lb 11.6 oz)] 110.099 kg (242 lb 11.6 oz) (09/06 1119)  Labs:   Estimated body mass index is 44.38 kg/(m^2) as calculated from the following:   Height as of 08/28/14: 5\' 2"  (1.575 m).   Weight as of this encounter: 110.099 kg (242 lb 11.6 oz).   Imaging Review Plain radiographs demonstrate severe degenerative joint disease of the right hip(s). The bone quality appears to be good for age and reported activity level.  Assessment/Plan:  End stage arthritis, right hip(s)  The patient history, physical examination, clinical judgement of  the provider and imaging studies are consistent with end stage degenerative joint disease of the right hip(s) and total hip arthroplasty is deemed medically necessary. The treatment options including medical management, injection therapy, arthroscopy and arthroplasty were discussed at length. The risks and benefits of total hip arthroplasty were presented and reviewed. The risks due to aseptic loosening, infection, stiffness, dislocation/subluxation,  thromboembolic complications and other imponderables were discussed.  The patient acknowledged the explanation, agreed to proceed with the plan and consent was signed. Patient is being admitted for inpatient treatment for surgery, pain control, PT, OT, prophylactic antibiotics, VTE prophylaxis, progressive ambulation and ADL's and discharge planning.The patient is planning to be discharged home with home health services

## 2014-09-08 NOTE — Progress Notes (Signed)
PHARMACIST - PHYSICIAN ORDER COMMUNICATION  CONCERNING: P&T Medication Policy on Herbal Medications  DESCRIPTION:  This patient's order for:  CoQ10 and Lutein  has been noted.  This product(s) is classified as an "herbal" or natural product. Due to a lack of definitive safety studies or FDA approval, nonstandard manufacturing practices, plus the potential risk of unknown drug-drug interactions while on inpatient medications, the Pharmacy and Therapeutics Committee does not permit the use of "herbal" or natural products of this type within [facility].   ACTION TAKEN: The pharmacy department is unable to verify this order at this time and your patient has been informed of this safety policy. Please reevaluate patient's clinical condition at discharge and address if the herbal or natural product(s) should be resumed at that time.  Dimitri Ped, PharmD. Clinical Pharmacist Resident Pager: [phone]

## 2014-09-08 NOTE — Brief Op Note (Signed)
09/08/2014  3:06 PM  PATIENT:  [name]  64 y.o. female  PRE-OPERATIVE DIAGNOSIS:  Severe osteoarthritis right hip  POST-OPERATIVE DIAGNOSIS:  Severe osteoarthritis right hip  PROCEDURE:  Procedure(s): RIGHT TOTAL HIP ARTHROPLASTY ANTERIOR APPROACH (Right)  SURGEON:  Surgeon(s) and Role:    * Mcarthur Rossetti, MD - Primary  PHYSICIAN ASSISTANT: Benita Stabile, PA-C  ANESTHESIA:   general  EBL:  Total I/O In: -  Out: 600 [Urine:350; Blood:250]  BLOOD ADMINISTERED:none  DRAINS: none   LOCAL MEDICATIONS USED:  NONE  SPECIMEN:  No Specimen  DISPOSITION OF SPECIMEN:  N/A  COUNTS:  YES  TOURNIQUET:  * No tourniquets in log *  DICTATION: .Other Dictation: Dictation Number [id number]  PLAN OF CARE: Admit to inpatient   PATIENT DISPOSITION:  PACU - hemodynamically stable.   Delay start of Pharmacological VTE agent (>24hrs) due to surgical blood loss or risk of bleeding: no

## 2014-09-08 NOTE — Progress Notes (Signed)
C/o severe hip pain while lying in bed 8/10.  Dr Lissa Hoard called and order received and given.

## 2014-09-08 NOTE — Anesthesia Procedure Notes (Signed)
Procedure Name: Intubation Date/Time: 09/08/2014 1:47 PM Performed by: Melina Copa, Mckenzye Cutright R Pre-anesthesia Checklist: Patient identified, Emergency Drugs available, Suction available, Patient being monitored and Timeout performed Patient Re-evaluated:Patient Re-evaluated prior to inductionOxygen Delivery Method: Circle system utilized Preoxygenation: Pre-oxygenation with 100% oxygen Intubation Type: IV induction Ventilation: Mask ventilation without difficulty Laryngoscope Size: Mac and 3 Grade View: Grade II Tube type: Oral Tube size: 7.0 mm Number of attempts: 2 (First attempt into esophagus and immdediately recognized. ETT out, AOI) Airway Equipment and Method: Stylet Placement Confirmation: ETT inserted through vocal cords under direct vision,  positive ETCO2 and breath sounds checked- equal and bilateral Secured at: 21 cm Tube secured with: Tape Dental Injury: Teeth and Oropharynx as per pre-operative assessment

## 2014-09-08 NOTE — Anesthesia Preprocedure Evaluation (Addendum)
Anesthesia Evaluation  Patient identified by MRN, date of birth, ID band Patient awake    Reviewed: Allergy & Precautions, NPO status , Patient's Chart, lab work & pertinent test results  History of Anesthesia Complications (+) history of anesthetic complications  Airway Mallampati: II  TM Distance: >3 FB Neck ROM: Full    Dental no notable dental hx.    Pulmonary asthma ,  breath sounds clear to auscultation  Pulmonary exam normal       Cardiovascular hypertension, Pt. on home beta blockers Normal cardiovascular examRhythm:Regular Rate:Normal     Neuro/Psych PSYCHIATRIC DISORDERS negative neurological ROS     GI/Hepatic negative GI ROS, Neg liver ROS,   Endo/Other  Morbid obesity  Renal/GU negative Renal ROS     Musculoskeletal negative musculoskeletal ROS (+) Arthritis -,   Abdominal   Peds  Hematology negative hematology ROS (+)   Anesthesia Other Findings   Reproductive/Obstetrics negative OB ROS                             Anesthesia Physical Anesthesia Plan  ASA: III  Anesthesia Plan:    Post-op Pain Management:    Induction:   Airway Management Planned:   Additional Equipment:   Intra-op Plan:   Post-operative Plan:   Informed Consent: I have reviewed the patients History and Physical, chart, labs and discussed the procedure including the risks, benefits and alternatives for the proposed anesthesia with the patient or authorized representative who has indicated his/her understanding and acceptance.   Dental advisory given  Plan Discussed with: CRNA  Anesthesia Plan Comments: (Spinal vs. geta)        Anesthesia Quick Evaluation

## 2014-09-08 NOTE — Anesthesia Postprocedure Evaluation (Signed)
Anesthesia Post Note  Patient: [name]  Procedure(s) Performed: Procedure(s) (LRB): RIGHT TOTAL HIP ARTHROPLASTY ANTERIOR APPROACH (Right)  Anesthesia type: General  Patient location: PACU  Post pain: Pain level controlled  Post assessment: Post-op Vital signs reviewed  Last Vitals: BP 124/63 mmHg  Pulse 56  Temp(Src) 36.5 C  Resp 12  Wt 242 lb 11.6 oz (110.099 kg)  SpO2 100%  LMP 01/14/2014  Post vital signs: Reviewed  Level of consciousness: sedated  Complications: No apparent anesthesia complications

## 2014-09-08 NOTE — Transfer of Care (Signed)
Immediate Anesthesia Transfer of Care Note  Patient: [name]  Procedure(s) Performed: Procedure(s): RIGHT TOTAL HIP ARTHROPLASTY ANTERIOR APPROACH (Right)  Patient Location: PACU  Anesthesia Type:General  Level of Consciousness: awake and oriented  Airway & Oxygen Therapy: Patient Spontanous Breathing and Patient connected to nasal cannula oxygen  Post-op Assessment: Report given to RN, Post -op Vital signs reviewed and stable and Patient moving all extremities  Post vital signs: Reviewed and stable  Last Vitals:  Filed Vitals:   09/08/14 1119  BP: 120/42  Pulse: 76  Temp: 36.3 C  Resp: 16    Complications: No apparent anesthesia complications

## 2014-09-09 ENCOUNTER — Encounter (HOSPITAL_COMMUNITY): Payer: Self-pay | Admitting: Orthopaedic Surgery

## 2014-09-09 LAB — CBC
HEMATOCRIT: 31.5 % — AB (ref 36.0–46.0)
HEMOGLOBIN: 10.6 g/dL — AB (ref 12.0–15.0)
MCH: 30 pg (ref 26.0–34.0)
MCHC: 33.7 g/dL (ref 30.0–36.0)
MCV: 89.2 fL (ref 78.0–100.0)
Platelets: 188 10*3/uL (ref 150–400)
RBC: 3.53 MIL/uL — AB (ref 3.87–5.11)
RDW: 13.4 % (ref 11.5–15.5)
WBC: 9.7 10*3/uL (ref 4.0–10.5)

## 2014-09-09 LAB — BASIC METABOLIC PANEL
Anion gap: 8 (ref 5–15)
BUN: 11 mg/dL (ref 6–20)
CHLORIDE: 101 mmol/L (ref 101–111)
CO2: 26 mmol/L (ref 22–32)
Calcium: 8 mg/dL — ABNORMAL LOW (ref 8.9–10.3)
Creatinine, Ser: 0.72 mg/dL (ref 0.44–1.00)
GFR calc non Af Amer: >60 mL/min (ref >60)
Glucose, Bld: 126 mg/dL — ABNORMAL HIGH (ref 65–99)
POTASSIUM: 3.6 mmol/L (ref 3.5–5.1)
SODIUM: 135 mmol/L (ref 135–145)

## 2014-09-09 NOTE — Progress Notes (Signed)
Physical Therapy Treatment Patient Details Name: [name] MRN: [id number] DOB: [birth year] Today's Date: 09/09/2014    History of Present Illness Patient is a 64 y/o female s/p R THA. PMH includes obesity, HTN, hypercholesteremia.    PT Comments    Patient progressing well towards PT goals. Continues to have difficulty with bed mobility. Improved ambulation distance this session. Performed exercises. Will plan for stair training tomorrow as tolerated to prepare pt for discharge home. Will follow acutely per current POC.   Follow Up Recommendations  Home health PT;Supervision/Assistance - 24 hour     Equipment Recommendations  None recommended by PT    Recommendations for Other Services       Precautions / Restrictions Precautions Precautions: Fall Restrictions Weight Bearing Restrictions: Yes RLE Weight Bearing: Weight bearing as tolerated    Mobility  Bed Mobility Overal bed mobility: Needs Assistance Bed Mobility: Supine to Sit;Sit to Supine     Supine to sit: Min assist Sit to supine: Mod assist   General bed mobility comments: HOB flat, no use of rails to simulate home. Assist to bring RLE into/out of bed. Cues to use LLE to assist or use belt.   Transfers Overall transfer level: Needs assistance Equipment used: Rolling walker (2 wheeled) Transfers: Sit to/from Stand Sit to Stand: Min guard         General transfer comment: Min guard for safety. Stood from [employer], from chair x1. Cues for controlled descent into chair.  Ambulation/Gait Ambulation/Gait assistance: Min assist Ambulation Distance (Feet): 45 Feet (x2 bouts) Assistive device: Rolling walker (2 wheeled) Gait Pattern/deviations: Step-to pattern;Decreased step length - right;Antalgic;Trunk flexed   Gait velocity interpretation: <1.8 ft/sec, indicative of risk for recurrent falls General Gait Details: Difficulty advancing RLE. Min A for RW proximity. Fatigues. Requires standing rest  breaks.   Stairs            Wheelchair Mobility    Modified Rankin (Stroke Patients Only)       Balance Overall balance assessment: Needs assistance Sitting-balance support: Feet supported;No upper extremity supported Sitting balance-Leahy Scale: Good Sitting balance - Comments: total A to donn post op shoe. Pt reports she can do this when in lift chair.   Standing balance support: During functional activity Standing balance-Leahy Scale: Poor Standing balance comment: Reilent on RW for support.                     Cognition Arousal/Alertness: Awake/alert Behavior During Therapy: WFL for tasks assessed/performed Overall Cognitive Status: Within Functional Limits for tasks assessed                      Exercises Total Joint Exercises Ankle Circles/Pumps: Both;10 reps;Supine Quad Sets: Both;15 reps;Seated Heel Slides: Right;10 reps;Supine Hip ABduction/ADduction: Right;AAROM;10 reps    General Comments General comments (skin integrity, edema, etc.): Daughter present in room during session.      Pertinent Vitals/Pain Pain Assessment: Faces Faces Pain Scale: Hurts little more Pain Location: right hip Pain Descriptors / Indicators: Sore;Burning Pain Intervention(s): Monitored during session;Repositioned    Home Living Family/patient expects to be discharged to:: Private residence Living Arrangements: Alone Available Help at Discharge: Family Type of Home: House Home Access: Stairs to enter Entrance Stairs-Rails: Right Home Layout: Multi-level;Able to live on main level with bedroom/bathroom Home Equipment: Gilford Rile - 2 wheels;Bedside commode;Cane - single point;Shower seat;Walker - 4 wheels;Other (comment) (lift chair)      Prior Function Level of Independence: Needs assistance  Gait / Transfers Assistance Needed: Using RW PTA. ADL's / Homemaking Assistance Needed: Getting difficult to perform ADLs recently- not able to wash feet. Comments:  Daughter assists with IADLs- driving.   PT Goals (current goals can now be found in the care plan section) Acute Rehab PT Goals Patient Stated Goal: to be more independent, do things around my house and make it to Bible study PT Goal Formulation: With patient Time For Goal Achievement: 09/23/14 Potential to Achieve Goals: Good Progress towards PT goals: Progressing toward goals    Frequency  7X/week    PT Plan Current plan remains appropriate    Co-evaluation             End of Session Equipment Utilized During Treatment: Gait belt Activity Tolerance: Patient tolerated treatment well Patient left: in bed;with call bell/phone within reach;with family/visitor present     Time: 1415-1450 PT Time Calculation (min) (ACUTE ONLY): 35 min  Charges:  $Gait Training: 8-22 mins $Therapeutic Exercise: 8-22 mins                    G Codes:      Sadae Arrazola A Orlo Brickle 09/09/2014, 3:09 PM Wray Kearns, [place], DPT [phone]

## 2014-09-09 NOTE — Evaluation (Signed)
Occupational Therapy Evaluation Patient Details Name: [name] MRN: [id number] DOB: 02-25-1950 Today's Date: 09/09/2014    History of Present Illness Patient is a 64 y/o female s/p R THA. PMH includes obesity, HTN, hypercholesteremia.   Clinical Impression   Patient presenting with acute pain, decreased I in self care, decreased I in functional mobility/transfers.  Patient reports being Mod I  PTA. Patient currently functioning at min A - mod A overall. Patient will benefit from acute OT to increase overall independence in the areas of ADLs, functional mobility, and safety in order to safely discharge home with HHOT. Pt with increased pain, slight fever, and nauseated during session.    Follow Up Recommendations  Home health OT    Equipment Recommendations  None recommended by OT    Recommendations for Other Services  (none)     Precautions / Restrictions Precautions Precautions: Fall Restrictions Weight Bearing Restrictions: Yes RLE Weight Bearing: Weight bearing as tolerated      Mobility Bed Mobility Overal bed mobility: Needs Assistance Bed Mobility: Supine to Sit;Sit to Supine     Supine to sit: Min assist Sit to supine: Mod assist   General bed mobility comments: . Assist to bring RLE into/out of bed. Cues to use LLE to assist.   Transfers Overall transfer level: Needs assistance Equipment used: Rolling walker (2 wheeled) Transfers: Sit to/from Stand Sit to Stand: Min guard         General transfer comment: Min guard for safety.  Cues for controlled descent into chair.    Balance Overall balance assessment: Needs assistance Sitting-balance support: Feet supported;No upper extremity supported Sitting balance-Leahy Scale: Good Sitting balance - Comments: total A to donn post op shoe. Pt reports she can do this when in lift chair.   Standing balance support: During functional activity Standing balance-Leahy Scale: Fair Standing balance comment: Pt  standing at sink to wash hands after toileting with min A for balance.                             ADL Overall ADL's : Needs assistance/impaired            General ADL Comments: Pt with increased pain of 7/10 this session with movement. Pt also reporting feeling nauseated and running a slight fever this afternoon. Pain medication requested during this session. Pt peforming functional transfers with min A this session for safety and  short ambulation with RW and steady assist. Pt would benefit from LB AE in order to increase I in LB self care. LB self care overall Mod A. Pt ambulating to toilet and performing transfer onto elevated toilet seat with min A. Pt performing clothing management and hygiene with steady assist as well. Pt returning to bed at end of session.               Pertinent Vitals/Pain Pain Assessment: 0-10 Pain Score: 7  Faces Pain Scale: Hurts little more Pain Location: R hip Pain Descriptors / Indicators: Sore;Aching;Grimacing;Guarding Pain Intervention(s): Limited activity within patient's tolerance;Monitored during session;Repositioned;Patient requesting pain meds-RN notified     Hand Dominance Right   Extremity/Trunk Assessment Upper Extremity Assessment Upper Extremity Assessment: Generalized weakness   Lower Extremity Assessment Lower Extremity Assessment: Defer to PT evaluation RLE Deficits / Details: Able to perform LAQ. Limited AROM/strength secondary to pain/recent surgery. RLE Sensation:  [facility].)       Communication Communication Communication: No difficulties   Cognition Arousal/Alertness: Awake/alert  Behavior During Therapy: WFL for tasks assessed/performed Overall Cognitive Status: Within Functional Limits for tasks assessed                     Home Living Family/patient expects to be discharged to:: Private residence Living Arrangements: Alone Available Help at Discharge: Family Type of Home: House Home Access: Stairs  to enter [employer] of Steps: 7 Entrance Stairs-Rails: Right Home Layout: Multi-level;Able to live on main level with bedroom/bathroom     Bathroom Shower/Tub: Tub/shower unit;Curtain;Other (comment) (has TTB) Shower/tub characteristics: Curtain Biochemist, clinical: Standard     Home Equipment: Environmental consultant - 2 wheels;Bedside commode;Cane - single point;Shower seat;Walker - 4 wheels;Other (comment);Tub bench          Prior Functioning/Environment Level of Independence: Needs assistance  Gait / Transfers Assistance Needed: Using RW PTA. ADL's / Homemaking Assistance Needed: Getting difficult to perform ADLs recently- not able to wash feet.   Comments: Daughter assists with IADLs- driving.    OT Diagnosis: Generalized weakness;Acute pain   OT Problem List: Decreased strength;Decreased activity tolerance;Impaired balance (sitting and/or standing);Decreased safety awareness;Pain;Decreased knowledge of use of DME or AE   OT Treatment/Interventions: Self-care/ADL training;Therapeutic exercise;Neuromuscular education;Energy conservation;DME and/or AE instruction;Patient/family education;Therapeutic activities;Balance training    OT Goals(Current goals can be found in the care plan section) Acute Rehab OT Goals Patient Stated Goal: to be more independent, do things around my house and make it to Bible study Time For Goal Achievement: 09/23/14 Potential to Achieve Goals: Good ADL Goals Pt Will Perform Upper Body Bathing: with modified independence Pt Will Perform Lower Body Bathing: with modified independence;with adaptive equipment;sit to/from stand Pt Will Perform Upper Body Dressing: with modified independence;sitting Pt Will Perform Lower Body Dressing: with modified independence;sit to/from stand;with adaptive equipment Pt Will Transfer to Toilet: with modified independence;bedside commode Pt Will Perform Toileting - Clothing Manipulation and hygiene: with modified  independence;sit to/from stand Pt Will Perform Tub/Shower Transfer: Tub transfer;ambulating;tub bench;with modified independence  OT Frequency: Min 2X/week   Barriers to D/C:    none known          End of Session Equipment Utilized During Treatment: Rolling walker;Other (comment) (BSC)  Activity Tolerance: Patient limited by fatigue;Patient limited by pain Patient left: in bed;with call bell/phone within reach;with family/visitor present   Time: 0177-9390 OT Time Calculation (min): 26 min Charges:  OT General Charges $OT Visit: 1 Procedure OT Evaluation $Initial OT Evaluation Tier I: 1 Procedure OT Treatments $Self Care/Home Management : 8-22 mins  Phineas Semen, MS, OTR/L 09/09/2014, 4:25 PM

## 2014-09-09 NOTE — Op Note (Signed)
[name], [name]                ACCOUNT NO.:  [id number]  MEDICAL RECORD NO.:  [id number]  LOCATION:  [id number]                        FACILITY:  [facility]  PHYSICIAN:  Lind Guest. Ninfa Linden, M.D.DATE OF BIRTH:  [birth year]  DATE OF PROCEDURE:  09/08/2014 DATE OF DISCHARGE:                              OPERATIVE REPORT   PREOPERATIVE DIAGNOSIS:  Primary osteoarthritis and degenerative joint disease, right hip.  POSTOPERATIVE DIAGNOSIS:  Primary osteoarthritis and degenerative joint disease, right hip.  PROCEDURE:  Right total hip arthroplasty through direct anterior approach.  IMPLANTS:  DePuy Sector Gription acetabular component size 50, size 32 +4 neutral polyethylene liner, size 13 Corail femoral component with standard offset, size 32 +5 ceramic hip ball.  SURGEON:  Jean Rosenthal, MD.  ASSISTANT:  Erskine Emery, P.A.  ANESTHESIA:  General.  BLOOD LOSS:  250 mL.  ANTIBIOTICS:  2 g of IV Ancef.  COMPLICATIONS:  None.  INDICATIONS:  Ms. Huot is a 64 year old morbidly obese female with bilateral hip severe end-stage arthritis.  It affects her mobility, affects her daily living, and her quality of life.  Her x-rays show complete loss of joint space, severe sclerotic changes, and cystic changes in the femoral head on the right side.  She has tried and failed all forms of conservative treatment.  I was able to examine her in the office in light of her morbid obesity.  She is lying on the exam table. I felt like we had a good soft tissue plane for proceeding with anterior hip surgery.  The risks and benefits were explained to her in detail including the risk of acute blood loss anemia, nerve and vessel injury, fracture, infection, dislocation, and DVT.  All these risk was heightened, given her obesity.  She understands this and does wish to proceed.  She understood our goals with decreased pain, improved mobility, and overall improved quality of life.  PROCEDURE  DESCRIPTION:  After informed consent was obtained, appropriate right hip was marked.  She was brought to the operating room.  General anesthesia was obtained while she was on her stretcher.  A Foley catheter was placed and traction boots were placed on both her feet. Next, she was placed supine on the Hana fracture table with perineal post in place and both legs in inline skeletal traction devices, but no traction applied.  Her right operative hip was then prepped and draped with DuraPrep and sterile drapes.  A time-out was called, and she was identified as correct patient, correct right hip.  I then made an incision inferior and posterior to the anterosuperior iliac spine and carried this obliquely down the leg.  We dissected down the tensor fascia lata muscle and tensor fascia was then divided longitudinally, so we proceeded with a direct anterior approach to the hip.  We identified and cauterized the lateral femoral circumflex vessels and then identified the hip capsule.  We placed curved retractor around the lateral and medial femoral neck.  We then opened up the hip capsule in an L-type format and found a large joint effusion and finding periarticular osteophytes.  We placed a Kober retractor in  the joint capsule.  We then made our femoral neck cut  with the oscillating saw proximal to the lesser trochanter and completed this on osteotome.  I placed a corkscrew guide in the femoral head and removed the femoral head in its entirety.  We then cleaned the acetabular rim and acetabular labrum of debris and placed a bent Hohmann on the medial acetabular rim and then began reaming under direct visualization from a size 42 reamer up to a size 50 with all reamers under direct visualization and the last reamer under direct fluoroscopy, so we could obtain our depth of reaming, our inclination, and anteversion.  Once we were pleased with this, we placed a real DePuy Sector Gription acetabular  component size 50 and a 32 +4 neutral polyethylene liner for that acetabular component. Attention was then turned to the femur with the leg externally rotated to 100  degrees, extended, and abducted.  We were able to place a Mueller retractor medially and Hohmann retractor behind the greater trochanter.  I released the lateral joint capsule and used a box cutting osteotome to enter the femoral canal and a rongeur to lateralize.  We then began broaching from a size 8 broach using a Corail broaching system up to a size 13.  With a size 13 in place, we trialed a 32 +1 hip ball and reduced this in acetabulum, and it was stable.  I did feel like though we needed to increase her leg length and offset just a little bit, so we went with a +5 ball.  We dislocated the hip and removed the trial components.  We then placed the real Corail femoral component with standard offset, size 13, and the real 32 +5 ceramic hip ball and reduced this in acetabulum and it was stable.  We copiously irrigated the soft tissue with normal saline solution using pulsatile lavage.  We then closed the joint capsule with interrupted #1 Ethibond suture followed by running #1 Vicryl in the tensor fascia, 0 Vicryl in the deep tissue, 2-0 Vicryl subcutaneous tissue, staples on the skin.  Xeroform and Aquacel dressing were applied.  She was then taken off the Hana table, awakened, extubated, and taken to the recovery room in stable condition.  All final counts were correct.  There were no complications noted.  Of note, Erskine Emery PA-C, assisted in the entire case.  His assistance was crucial for facilitating all aspects of this case, and he was present for the entire case.     Lind Guest. Ninfa Linden, M.D.     CYB/MEDQ  D:  09/08/2014  T:  09/09/2014  Job:  [id number]

## 2014-09-09 NOTE — Progress Notes (Signed)
Subjective: 1 Day Post-Op Procedure(s) (LRB): RIGHT TOTAL HIP ARTHROPLASTY ANTERIOR APPROACH (Right) Patient reports pain as moderate.  Acute blood loss anemia from surgery, but stable vitals.  Objective: Vital signs in last 24 hours: Temp:  [97.3 F (36.3 C)-100 F (37.8 C)] 100 F (37.8 C) (09/07 0235) Pulse Rate:  [56-92] 92 (09/07 0235) Resp:  [8-17] 16 (09/07 0235) BP: (109-145)/(42-68) 121/59 mmHg (09/07 0235) SpO2:  [90 %-100 %] 97 % (09/07 0235) Weight:  [110.099 kg (242 lb 11.6 oz)] 110.099 kg (242 lb 11.6 oz) (09/06 1119)  Intake/Output from previous day: 09/06 0701 - 09/07 0700 In: 2282.5 [P.O.:240; I.V.:2042.5] Out: 600 [Urine:350; Blood:250] Intake/Output this shift:     Recent Labs  09/09/14 0436  HGB 10.6*    Recent Labs  09/09/14 0436  WBC 9.7  RBC 3.53*  HCT 31.5*  PLT 188    Recent Labs  09/09/14 0436  NA 135  K 3.6  CL 101  CO2 26  BUN 11  CREATININE 0.72  GLUCOSE 126*  CALCIUM 8.0*   No results for input(s): LABPT, INR in the last 72 hours.  Sensation intact distally Intact pulses distally Dorsiflexion/Plantar flexion intact Incision: dressing C/D/I  Assessment/Plan: 1 Day Post-Op Procedure(s) (LRB): RIGHT TOTAL HIP ARTHROPLASTY ANTERIOR APPROACH (Right) Up with therapy Discharge home with home health by Friday.  Esti Demello Y 09/09/2014, 8:02 AM

## 2014-09-09 NOTE — Evaluation (Signed)
Physical Therapy Evaluation Patient Details Name: [name] MRN: [id number] DOB: 1950/11/27 Today's Date: 09/09/2014   History of Present Illness  Patient is a 64 y/o female s/p R THA. PMH includes obesity, HTN, hypercholesteremia.  Clinical Impression  Patient presents with pain and post surgical deficits RLE s/p R THA. Tolerated short distance ambulation with Min A for balance/safety. Pt fatigues quickly. Pt lives alone but has support from daughter. Very motivated to return to independence. Pt getting HHPT PTA. Reviewed exercises and handout. Plan for gait training this PM. Will follow acutely to maximize independence and mobility prior to return home.     Follow Up Recommendations Home health PT;Supervision/Assistance - 24 hour    Equipment Recommendations  None recommended by PT    Recommendations for Other Services       Precautions / Restrictions Precautions Precautions: Fall Restrictions Weight Bearing Restrictions: Yes RLE Weight Bearing: Weight bearing as tolerated      Mobility  Bed Mobility Overal bed mobility: Needs Assistance Bed Mobility: Supine to Sit     Supine to sit: Min guard;HOB elevated     General bed mobility comments: Instructed pt to use LLE to bring RLE to EOB. Use of rails. Cues for sequencing.   Transfers Overall transfer level: Needs assistance Equipment used: Rolling walker (2 wheeled) Transfers: Sit to/from Stand Sit to Stand: Min assist         General transfer comment: Min A to boost from EOB with cues for hand placement and technique. Stood fromEOB x1, from chair x1. Cues for controlled descent into chair.  Ambulation/Gait Ambulation/Gait assistance: Min assist Ambulation Distance (Feet): 20 Feet (+20') Assistive device: Rolling walker (2 wheeled) Gait Pattern/deviations: Step-to pattern;Decreased stance time - right;Decreased step length - left;Trunk flexed;Antalgic   Gait velocity interpretation: <1.8 ft/sec, indicative  of risk for recurrent falls General Gait Details: Slow, unsteady gait. Cues for upright posture. Fatigues. 1 seated rest break. Transferred to chair post ambulation bout.  Stairs            Wheelchair Mobility    Modified Rankin (Stroke Patients Only)       Balance Overall balance assessment: Needs assistance Sitting-balance support: Feet supported;No upper extremity supported Sitting balance-Leahy Scale: Good Sitting balance - Comments: total A to donn post op shoe.   Standing balance support: During functional activity Standing balance-Leahy Scale: Poor Standing balance comment: Reilent on RW for support.                              Pertinent Vitals/Pain Pain Assessment: Faces Faces Pain Scale: Hurts little more Pain Location: right hip Pain Descriptors / Indicators: Sore;Aching Pain Intervention(s): Monitored during session;Repositioned    Home Living Family/patient expects to be discharged to:: Private residence Living Arrangements: Alone Available Help at Discharge: Family Type of Home: House Home Access: Stairs to enter Entrance Stairs-Rails: Right Entrance Stairs-Number of Steps: 7 Home Layout: Multi-level;Able to live on main level with bedroom/bathroom Home Equipment: Gilford Rile - 2 wheels;Bedside commode;Cane - single point;Shower seat;Walker - 4 wheels;Other (comment) (lift chair)      Prior Function Level of Independence: Needs assistance   Gait / Transfers Assistance Needed: Using RW PTA.  ADL's / Homemaking Assistance Needed: Getting difficult to perform ADLs recently- not able to wash feet.  Comments: Daughter assists with IADLs- driving.     Hand Dominance        Extremity/Trunk Assessment   Upper Extremity Assessment: Defer  to OT evaluation           Lower Extremity Assessment: RLE deficits/detail RLE Deficits / Details: Able to perform LAQ. Limited AROM/strength secondary to pain/recent surgery.       Communication    Communication: No difficulties  Cognition Arousal/Alertness: Awake/alert Behavior During Therapy: WFL for tasks assessed/performed Overall Cognitive Status: Within Functional Limits for tasks assessed                      General Comments General comments (skin integrity, edema, etc.): Daughter present in room during session.    Exercises Total Joint Exercises Ankle Circles/Pumps: Both;15 reps;Seated Quad Sets: Both;15 reps;Seated Gluteal Sets: Both;15 reps;Seated Long Arc Quad: Right;10 reps;Seated      Assessment/Plan    PT Assessment Patient needs continued PT services  PT Diagnosis Difficulty walking;Acute pain;Generalized weakness   PT Problem List Decreased strength;Pain;Decreased range of motion;Decreased activity tolerance;Decreased balance;Decreased mobility  PT Treatment Interventions Balance training;Gait training;Stair training;Functional mobility training;Therapeutic activities;Therapeutic exercise;Patient/family education   PT Goals (Current goals can be found in the Care Plan section) Acute Rehab PT Goals Patient Stated Goal: to be more independent, do things around my house and make it to Bible study PT Goal Formulation: With patient Time For Goal Achievement: 09/23/14 Potential to Achieve Goals: Good    Frequency 7X/week   Barriers to discharge Decreased caregiver support;Inaccessible home environment Pt lives alone and needs to navigate 7 steps to enter home.    Co-evaluation               End of Session Equipment Utilized During Treatment: Gait belt Activity Tolerance: Patient tolerated treatment well Patient left: in chair;with call bell/phone within reach;with family/visitor present Nurse Communication: Mobility status         Time: 0630-1601 PT Time Calculation (min) (ACUTE ONLY): 23 min   Charges:   PT Evaluation $Initial PT Evaluation Tier I: 1 Procedure PT Treatments $Gait Training: 8-22 mins   PT G Codes:         Shiri Hodapp A Haik Mahoney 09/09/2014, 12:49 PM  Wray Kearns, [place], DPT [phone]

## 2014-09-10 ENCOUNTER — Encounter (HOSPITAL_COMMUNITY): Payer: Self-pay | Admitting: General Practice

## 2014-09-10 LAB — CBC
HCT: 32.1 % — ABNORMAL LOW (ref 36.0–46.0)
HEMOGLOBIN: 10.4 g/dL — AB (ref 12.0–15.0)
MCH: 29 pg (ref 26.0–34.0)
MCHC: 32.4 g/dL (ref 30.0–36.0)
MCV: 89.4 fL (ref 78.0–100.0)
Platelets: 183 10*3/uL (ref 150–400)
RBC: 3.59 MIL/uL — ABNORMAL LOW (ref 3.87–5.11)
RDW: 13.2 % (ref 11.5–15.5)
WBC: 9.2 10*3/uL (ref 4.0–10.5)

## 2014-09-10 NOTE — Discharge Instructions (Signed)

## 2014-09-10 NOTE — Progress Notes (Signed)
Subjective: 2 Days Post-Op Procedure(s) (LRB): RIGHT TOTAL HIP ARTHROPLASTY ANTERIOR APPROACH (Right) Patient reports pain as moderate.    Objective: Vital signs in last 24 hours: Temp:  [99.2 F (37.3 C)-101.8 F (38.8 C)] 99.2 F (37.3 C) (09/08 0524) Pulse Rate:  [82-87] 82 (09/08 0524) Resp:  [16] 16 (09/08 0524) BP: (124-128)/(50-59) 124/59 mmHg (09/08 0524) SpO2:  [97 %-100 %] 98 % (09/08 0524)  Intake/Output from previous day: 09/07 0701 - 09/08 0700 In: 480 [P.O.:480] Out: -  Intake/Output this shift:     Recent Labs  09/09/14 0436 09/10/14 0600  HGB 10.6* 10.4*    Recent Labs  09/09/14 0436 09/10/14 0600  WBC 9.7 9.2  RBC 3.53* 3.59*  HCT 31.5* 32.1*  PLT 188 183    Recent Labs  09/09/14 0436  NA 135  K 3.6  CL 101  CO2 26  BUN 11  CREATININE 0.72  GLUCOSE 126*  CALCIUM 8.0*   No results for input(s): LABPT, INR in the last 72 hours.  Sensation intact distally Intact pulses distally Dorsiflexion/Plantar flexion intact Incision: scant drainage  Assessment/Plan: 2 Days Post-Op Procedure(s) (LRB): RIGHT TOTAL HIP ARTHROPLASTY ANTERIOR APPROACH (Right) Up with therapy Plan for discharge tomorrow Discharge home with home health  Mcarthur Rossetti 09/10/2014, 7:57 AM

## 2014-09-10 NOTE — Progress Notes (Signed)
Physical Therapy Treatment Patient Details Name: [name] MRN: [id number] DOB: 1950-11-24 Today's Date: 09/10/2014    History of Present Illness Patient is a 64 y/o female s/p R THA. PMH includes obesity, HTN, hypercholesteremia.    PT Comments    Patient progressing slowly towards PT goals. Continues to require Min A for balance/safety during gait/mobility due to fatigue and weakness. Concerned about safety at home as pt lives alone and will not have 24/7 S. Pt with fall history. Lengthy discussion with pt and daughter re: disposition, safety, mobility. If pt decides to go home, will need 24/7 S for at least 1 week but would benefit greatly from [name] to maximize independence and mobility prior to return home. CM notified of updated recommendation.   Follow Up Recommendations  SNF;Supervision/Assistance - 24 hour     Equipment Recommendations  None recommended by PT    Recommendations for Other Services       Precautions / Restrictions Precautions Precautions: Fall Restrictions Weight Bearing Restrictions: Yes RLE Weight Bearing: Weight bearing as tolerated    Mobility  Bed Mobility Overal bed mobility: Needs Assistance Bed Mobility: Sit to Supine       Sit to supine: Min assist   General bed mobility comments: Assist to bring RLE into bed and for repositioning. HOB flat, and use of rails.  Transfers Overall transfer level: Needs assistance Equipment used: Rolling walker (2 wheeled) Transfers: Sit to/from Stand Sit to Stand: Min guard         General transfer comment: Min guard for safety.  Stood from [employer], from bed x1, from toilet x1.   Ambulation/Gait Ambulation/Gait assistance: Min assist Ambulation Distance (Feet): 75 Feet (+ 20') Assistive device: Rolling walker (2 wheeled) Gait Pattern/deviations: Step-to pattern;Antalgic;Decreased step length - right   Gait velocity interpretation: <1.8 ft/sec, indicative of risk for recurrent  falls General Gait Details: Difficulty advancing RLE. Min A for RW proximity/safety. Requires standing rest breaks leaning forearms on RW for support despite cues to sit.   Stairs            Wheelchair Mobility    Modified Rankin (Stroke Patients Only)       Balance Overall balance assessment: Needs assistance Sitting-balance support: Feet supported;No upper extremity supported Sitting balance-Leahy Scale: Good     Standing balance support: During functional activity Standing balance-Leahy Scale: Fair                      Cognition Arousal/Alertness: Awake/alert Behavior During Therapy: WFL for tasks assessed/performed Overall Cognitive Status: Within Functional Limits for tasks assessed                      Exercises Total Joint Exercises Long Arc Quad: Right;10 reps;Seated    General Comments        Pertinent Vitals/Pain Pain Assessment: Faces Faces Pain Scale: Hurts little more Pain Location: Right hip Pain Descriptors / Indicators: Sore;Burning Pain Intervention(s): Limited activity within patient's tolerance;Monitored during session;Repositioned    Home Living                      Prior Function            PT Goals (current goals can now be found in the care plan section) Progress towards PT goals: Progressing toward goals    Frequency  7X/week    PT Plan Discharge plan needs to be updated    Co-evaluation  End of Session Equipment Utilized During Treatment: Gait belt Activity Tolerance: Patient tolerated treatment well Patient left: in bed;with call bell/phone within reach;with family/visitor present     Time: 1453 [phone] PT Time Calculation (min) (ACUTE ONLY): 23 min  Charges:  $Gait Training: 8-22 mins $Self Care/Home Management: 8-22                    G Codes:      [name] 09/10/2014, 4:24 PM Wray Kearns, [place], DPT [phone]

## 2014-09-10 NOTE — Plan of Care (Signed)
Problem: Consults Goal: Diagnosis- Total Joint Replacement Primary Total Hip     

## 2014-09-10 NOTE — Progress Notes (Signed)
Occupational Therapy Treatment Patient Details Name: [name] MRN: [id number] DOB: [birth year] Today's Date: 09/10/2014    History of present illness Patient is a 64 y/o female s/p R THA. PMH includes obesity, HTN, hypercholesteremia.   OT comments  Pt reports feeling much better this session and was able to tolerate activity much better than previous evaluation. OT educated pt on use of LB AE in order to increase I in LB self care. Pt returned demonstrations with min verbal cues for technique. Her daughter remains present in room this session. Pt requested to wash hair at sink in which she showed good standing endurance for 8 minutes to wash hair. Pt fatigued at end of session and seated in recliner chair finishing grooming with set up A.   Follow Up Recommendations  Home health OT;Supervision - Intermittent    Equipment Recommendations  Other (comment) (LB AE HIP kit - reacher, shoe horn, sock aide)    Recommendations for Other Services      Precautions / Restrictions Precautions Precautions: Fall Restrictions Weight Bearing Restrictions: Yes RLE Weight Bearing: Weight bearing as tolerated       Mobility Bed Mobility Overal bed mobility: Needs Assistance Bed Mobility: Supine to Sit;Sit to Supine     Supine to sit: Min guard     General bed mobility comments: Pt utilized L LE to assist R LE out of bed with min verbal cues for technique. Pt seated in recliner chair at end of session.   Transfers Overall transfer level: Needs assistance Equipment used: Rolling walker (2 wheeled) Transfers: Sit to/from Stand Sit to Stand: Supervision              Balance Overall balance assessment: Needs assistance Sitting-balance support: Feet supported Sitting balance-Leahy Scale: Good Sitting balance - Comments: Pt seated on EOB and donning B shoes with use of AE.   Standing balance support: During functional activity Standing balance-Leahy Scale: Fair Standing balance  comment: Pt standing at sink and leaning forwards to wash hair in sink with min guard assist for balance.                    ADL Overall ADL's : Needs assistance/impaired             General ADL Comments: OT educated pt on use of LH reacher, LH sponge, shoe horn, and sock aide in order to increase I in self care. OT provided demonstrations with equipment and pt returning demonstrations with min verbal cues for proper technique. Pt donned B socks , boot, and shoe with use of equipment this session. Pt then ambulating 10' into bathroom with RW and close supervision. Pt standing at sink side for 8 minutes  while leaning forwards and washing hair in sink with min guard assist. Pt  returning to sit in recliner chair where she continued grooming tasks with set up A.                Cognition   Behavior During Therapy: WFL for tasks assessed/performed Overall Cognitive Status: Within Functional Limits for tasks assessed                                    Pertinent Vitals/ Pain       Pain Assessment: Faces Faces Pain Scale: Hurts little more Pain Location: R hip Pain Descriptors / Indicators: Aching Pain Intervention(s): Monitored during session;Repositioned  Home Living Family/patient  expects to be discharged to:: Private residence Living Arrangements: Alone                                          Frequency Min 2X/week     Progress Toward Goals  OT Goals(current goals can now be found in the care plan section)  Progress towards OT goals: Progressing toward goals     Plan Discharge plan remains appropriate       End of Session Equipment Utilized During Treatment: Rolling walker   Activity Tolerance Patient tolerated treatment well   Patient Left with call bell/phone within reach;with family/visitor present;in chair   Nurse Communication          Time: 6962-9528 OT Time Calculation (min): 36 min  Charges: OT General  Charges $OT Visit: 1 Procedure OT Treatments $Self Care/Home Management : 23-37 mins  Phineas Semen, MS, OTR/L 09/10/2014, 11:56 AM

## 2014-09-10 NOTE — Progress Notes (Signed)
CSW informed that pt now wanting SNF- would like [facility].  CSW faxed referral to [facility] can accept pending insurance [name]- will start insurance auth this afternoon  Domenica Reamer, [place] Social Worker [phone]

## 2014-09-11 MED ORDER — ASPIRIN 325 MG PO TBEC: 325.0000 mg | DELAYED_RELEASE_TABLET | Freq: Two times a day (BID) | ORAL | Status: DC

## 2014-09-11 MED ORDER — OXYCODONE-ACETAMINOPHEN 5-325 MG PO TABS: 1.0000 | ORAL_TABLET | ORAL | Status: DC | PRN

## 2014-09-11 NOTE — Clinical Social Work Note (Signed)
Patient to be discharged to [facility]. Patient updated at bedside.  Facility: [facility] RN report number: [phone] Transportation: EMS  Lubertha Sayres, [place] [phone]) and Surgical [phone])

## 2014-09-11 NOTE — Clinical Social Work Placement (Signed)
   CLINICAL SOCIAL WORK PLACEMENT  NOTE  Date:  09/11/2014  Patient Details  Name: [name] MRN: [id number] Date of Birth: 1950-07-06  Clinical Social Work is seeking post-discharge placement for this patient at the [place] level of care (*CSW will initial, date and re-position this form in  chart as items are completed):  Yes   Patient/family provided with [facility] Work Department's list of facilities offering this level of care within the geographic area requested by the patient (or if unable, by the patient's family).  Yes   Patient/family informed of their freedom to choose among providers that offer the needed level of care, that participate in Medicare, Medicaid or managed care program needed by the patient, have an available bed and are willing to accept the patient.  Yes   Patient/family informed of [facility]'s ownership interest in [facility] and [facility], as well as of the fact that they are under no obligation to receive care at these facilities.  PASRR submitted to EDS on 09/11/14     PASRR number received on 09/11/14     Existing PASRR number confirmed on  (n/a)     FL2 transmitted to all facilities in geographic area requested by pt/family on 09/11/14     FL2 transmitted to all facilities within larger geographic area on  (n/a)     Patient informed that his/her managed care company has contracts with or will negotiate with certain facilities, including the following:   (yes, [facility])     Yes   Patient/family informed of bed offers received.  Patient chooses bed at [facility]     Physician recommends and patient chooses bed at  (n/a)    Patient to be transferred to [facility] on 09/11/14.  Patient to be transferred to facility by [facility]     Patient family notified on 09/11/14 of transfer.  Name of family member notified:  Patient updated at bedside.     PHYSICIAN       Additional Comment:     _______________________________________________ Caroline Sauger, LCSW 09/11/2014, 12:47 PM

## 2014-09-11 NOTE — Discharge Summary (Signed)
Patient ID: [name] MRN: [id number] DOB/AGE: Jul 11, 1950 64 y.o.  Admit date: 09/08/2014 Discharge date: 09/11/2014  Admission Diagnoses:  Principal Problem:   Osteoarthritis of right hip Active Problems:   Status post total replacement of right hip   Discharge Diagnoses:  Same  Past Medical History  Diagnosis Date  . Obesity   . Hypercholesteremia   . Hypertension since 1990's  . Abnormal Pap smear of cervix 03/2009    Colpo Biopsy CIN I with HPV  . Scoliosis   . Asthma   . Bronchitis   . Bilateral edema of lower extremity     Takes lasix if needed  . Arthritis   . Teeth grinding   . Complication of anesthesia     Pt had some asthma attack after having cervical surgery in 2007    Surgeries: Procedure(s): RIGHT TOTAL HIP ARTHROPLASTY ANTERIOR APPROACH on 09/08/2014   Consultants:    Discharged Condition: Improved  Hospital Course: [name] is an 64 y.o. female who was admitted 09/08/2014 for operative treatment ofOsteoarthritis of right hip. Patient has severe unremitting pain that affects sleep, daily activities, and work/hobbies. After pre-op clearance the patient was taken to the operating room on 09/08/2014 and underwent  Procedure(s): RIGHT TOTAL HIP ARTHROPLASTY ANTERIOR APPROACH.    Patient was given perioperative antibiotics: Anti-infectives    Start     Dose/Rate Route Frequency Ordered Stop   09/08/14 2000  ceFAZolin (ANCEF) IVPB 1 g/50 mL premix     1 g 100 mL/hr over 30 Minutes Intravenous Every 6 hours 09/08/14 1709 09/09/14 0249   09/08/14 1300  ceFAZolin (ANCEF) IVPB 2 g/50 mL premix  Status:  Discontinued     2 g 100 mL/hr over 30 Minutes Intravenous On call to O.R. 09/08/14 1058 09/08/14 1641   09/08/14 1102  ceFAZolin (ANCEF) 2-3 GM-% IVPB SOLR    Comments:  [name]  : cabinet override      09/08/14 1102 09/08/14 1335       Patient was given sequential compression devices, early ambulation, and chemoprophylaxis to prevent  DVT.  Patient benefited maximally from hospital stay and there were no complications.    Recent vital signs: Patient Vitals for the past 24 hrs:  BP Temp Temp src Pulse Resp SpO2  09/11/14 0625 (!) 124/52 mmHg 98.3 F (36.8 C) - 74 18 99 %  09/10/14 1947 (!) 140/58 mmHg 99.5 F (37.5 C) Oral 88 (!) 21 98 %  09/10/14 1543 (!) 129/45 mmHg - - 88 20 100 %  09/10/14 1500 (!) 88/58 mmHg - - - - -  09/10/14 1423 (!) 89/45 mmHg 98.8 F (37.1 C) Oral 79 20 96 %  09/10/14 1215 - 99.2 F (37.3 C) Oral 79 - 98 %  09/10/14 1044 (!) 125/48 mmHg - - 82 (!) 22 -     Recent laboratory studies:  Recent Labs  09/09/14 0436 09/10/14 0600  WBC 9.7 9.2  HGB 10.6* 10.4*  HCT 31.5* 32.1*  PLT 188 183  NA 135  --   K 3.6  --   CL 101  --   CO2 26  --   BUN 11  --   CREATININE 0.72  --   GLUCOSE 126*  --   CALCIUM 8.0*  --      Discharge Medications:     Medication List    STOP taking these medications        diclofenac 75 MG EC tablet  Commonly known as:  VOLTAREN     HYDROcodone-acetaminophen 5-325 MG per tablet  Commonly known as:  NORCO/VICODIN      TAKE these medications        albuterol 108 (90 BASE) MCG/ACT inhaler  Commonly known as:  PROVENTIL HFA;VENTOLIN HFA  Inhale 2 puffs into the lungs every 6 (six) hours as needed for wheezing or shortness of breath.     aspirin 325 MG EC tablet  Take 1 tablet (325 mg total) by mouth 2 (two) times daily after a meal.     Cinnamon 500 MG Tabs  Take 500 mg by mouth 2 (two) times daily.     CoQ10 200 MG Caps  Take 200 mg by mouth every evening.     cyclobenzaprine 10 MG tablet  Commonly known as:  FLEXERIL  Take 10 mg by mouth 2 (two) times daily as needed for muscle spasms.     DAILY MULTIVITAMIN PO  Take 0.5 tablets by mouth 2 (two) times daily. With D     DUONEB 0.5-2.5 (3) MG/3ML Soln  Generic drug:  ipratropium-albuterol  Take 3 mLs by nebulization every 4 (four) hours as needed (shortness of breath).      felodipine 10 MG 24 hr tablet  Commonly known as:  PLENDIL  Take 10 mg by mouth daily.     Fish Oil 1200 MG Caps  Take 1,200 mg by mouth 2 (two) times daily.     FLAX SEED OIL PO  Take 1 capsule by mouth daily.     GARLIC PO  Take 2,637 mg by mouth daily.     Grape Seed Oil  Take 100 mg by mouth daily.     irbesartan 300 MG tablet  Commonly known as:  AVAPRO  Take 300 mg by mouth daily.     labetalol 200 MG tablet  Commonly known as:  NORMODYNE  Take 400 mg by mouth 2 (two) times daily.     LASIX 20 MG tablet  Generic drug:  furosemide  Take 20-30 mg by mouth daily as needed for fluid.     LIDAZONE HC 3-0.5 % Crea  Generic drug:  lidocaine-hydrocortisone  Place 1 Applicatorful rectally as needed (hemorrhoids).     Lutein 20 MG Caps  Take 20 mg by mouth daily.     medroxyPROGESTERone 10 MG tablet  Commonly known as:  PROVERA  Take 1 tablet (10 mg total) by mouth daily.     oxyCODONE-acetaminophen 5-325 MG per tablet  Commonly known as:  ROXICET  Take 1-2 tablets by mouth every 4 (four) hours as needed.     PROBIOTIC DAILY PO  Take 1 capsule by mouth daily.     simvastatin 20 MG tablet  Commonly known as:  ZOCOR  Take 20 mg by mouth every evening.     STOOL SOFTENER PO  Take 1 tablet by mouth daily.     triamcinolone cream 0.1 %  Commonly known as:  KENALOG  Apply 1 application topically 2 (two) times daily.     UNABLE TO FIND  Take 1,200 mg by mouth daily. Med Name: [facility] [place]     Vitamin D (Cholecalciferol) 1000 UNITS Tabs  Take 1,000 Units by mouth 2 (two) times daily.        Diagnostic Studies: Dg Hip Port Unilat With Pelvis 1v Right  09/08/2014   CLINICAL DATA:  Total right hip replacement .  EXAM: DG HIP (WITH OR WITHOUT PELVIS) 1V PORT RIGHT  COMPARISON:  09/08/2014.  FINDINGS: Total right  hip replacement good anatomic alignment. Hardware intact. No acute bony abnormality P  IMPRESSION: Total right hip replacement with good anatomic alignment.    Electronically Signed   By: Marcello Moores  Register   On: 09/08/2014 15:54   Dg Hip Operative Unilat With Pelvis Right  09/08/2014   CLINICAL DATA:  Intraoperative images from right hip arthroplasty.  EXAM: OPERATIVE RIGHT HIP (WITH PELVIS IF PERFORMED)  VIEWS  TECHNIQUE: Fluoroscopic spot image(s) were submitted for interpretation post-operatively.  COMPARISON:  None.  FINDINGS: Two intraoperative fluoroscopic frontal images of the right hip are presented for interpretation. Right total hip arthroplasty with long stem femoral component is seen. The hardware is well seated. There is no evidence of fracture.  Total fluoroscopy time is 28 seconds.  IMPRESSION: Right hip arthroplasty without evidence of immediate complications.   Electronically Signed   By: Fidela Salisbury M.D.   On: 09/08/2014 15:27    Disposition: to skilled nursing facility       Discharge Instructions    Discharge patient    Complete by:  As directed            Follow-up Information    Follow up with Mcarthur Rossetti, MD In 2 weeks.   Specialty:  Orthopedic Surgery   Contact information:   [place] [address] [phone]        Signed: Mcarthur Rossetti 09/11/2014, 7:04 AM   !

## 2014-09-11 NOTE — Progress Notes (Signed)
Physical Therapy Treatment Patient Details Name: [name] MRN: [id number] DOB: February 13, 1950 Today's Date: 09/11/2014    History of Present Illness Patient is a 64 y/o female s/p R THA. PMH includes obesity, HTN, hypercholesteremia.    PT Comments    Patient progressing well towards PT goals. Performed stair training this session with Min A for balance/safety. Continues to exhibit weakness, impaired endurance and safety concerns. Fatigues easily. Better able to advance RLE today during ambulation. Pt plans [facility]. Will follow acutely per current POC if still in hospital tomorrow.   Follow Up Recommendations  SNF;Supervision/Assistance - 24 hour     Equipment Recommendations  None recommended by PT    Recommendations for Other Services       Precautions / Restrictions Precautions Precautions: Fall Restrictions Weight Bearing Restrictions: Yes RLE Weight Bearing: Weight bearing as tolerated    Mobility  Bed Mobility Overal bed mobility: Needs Assistance Bed Mobility: Supine to Sit     Supine to sit: Min guard;HOB elevated     General bed mobility comments: Increased time to get to EOB. Difficulty bringing RLE to EOB. Heavy use of rails for support.   Transfers Overall transfer level: Needs assistance Equipment used: Rolling walker (2 wheeled) Transfers: Sit to/from Stand Sit to Stand: Min guard         General transfer comment: Min guard for safety.  Stood from [employer], from low toilet x1, from [employer]. Use of grab bar to get on/off toilet.  Ambulation/Gait Ambulation/Gait assistance: Min guard Ambulation Distance (Feet): 75 Feet (+ 70') Assistive device: Rolling walker (2 wheeled) Gait Pattern/deviations: Step-to pattern;Step-through pattern;Antalgic;Decreased step length - right   Gait velocity interpretation: <1.8 ft/sec, indicative of risk for recurrent falls General Gait Details: Able to better advance RLE today. Min guard for safety.  Multiple short standing rest breaks and 1 seated rest break due to fatigue.   Stairs Stairs: Yes Stairs assistance: Min assist Stair Management: Sideways;Forwards;One rail Left Number of Stairs: 2 General stair comments: Cues for technique and safety. Preferred going up/down steps sideways. Min A for balance.   Wheelchair Mobility    Modified Rankin (Stroke Patients Only)       Balance Overall balance assessment: Needs assistance Sitting-balance support: Feet supported;No upper extremity supported Sitting balance-Leahy Scale: Good     Standing balance support: During functional activity Standing balance-Leahy Scale: Fair Standing balance comment: Tolerated dynamic standing activities- washing hands, pericare in standing with 1 UE support.                     Cognition Arousal/Alertness: Awake/alert Behavior During Therapy: WFL for tasks assessed/performed Overall Cognitive Status: Within Functional Limits for tasks assessed                      Exercises Total Joint Exercises Ankle Circles/Pumps: Both;10 reps;Supine Quad Sets: Both;10 reps;Supine Long Arc Quad: Right;10 reps;Seated    General Comments General comments (skin integrity, edema, etc.): Friend present during session.      Pertinent Vitals/Pain Pain Assessment: Faces Faces Pain Scale: Hurts little more Pain Location: right hip Pain Descriptors / Indicators: Sore;Burning Pain Intervention(s): Monitored during session;Premedicated before session;Repositioned    Home Living                      Prior Function            PT Goals (current goals can now be found  in the care plan section) Progress towards PT goals: Progressing toward goals    Frequency  7X/week    PT Plan Current plan remains appropriate    Co-evaluation             End of Session Equipment Utilized During Treatment: Gait belt Activity Tolerance: Patient tolerated treatment well Patient left: in  chair;with call bell/phone within reach;with family/visitor present     Time: 2025-4270 PT Time Calculation (min) (ACUTE ONLY): 37 min  Charges:  $Gait Training: 8-22 mins $Therapeutic Activity: 8-22 mins                    G Codes:      Sharayah Renfrow A Chantrice Hagg 09/11/2014, 10:57 AM Wray Kearns, [place], DPT [phone]

## 2014-09-11 NOTE — Progress Notes (Signed)
Patient ID: LYN JOENS, female   DOB: 03/10/1950, 64 y.o.   MRN: [id number] Doing well over all.  Stable to discharge today.  Now needing short-term SNF for extra rehab.

## 2014-09-11 NOTE — Progress Notes (Signed)
Called report to SNF.   

## 2014-09-11 NOTE — Clinical Social Work Note (Signed)
Clinical Social Work Assessment  Patient Details  Name: [name] MRN: [id number] Date of Birth: 06-26-1950  Date of referral:  09/11/14               Reason for consult:  Discharge Planning, Facility Placement                Permission sought to share information with:  Chartered certified accountant granted to share information::  Yes, Verbal Permission Granted  Name::        Agency::  [facility]  Relationship::     Contact Information:     Housing/Transportation Living arrangements for the past 2 months:  Single Family Home Source of Information:  Patient Patient Interpreter Needed:  None Criminal Activity/Legal Involvement Pertinent to Current Situation/Hospitalization:  No - Comment as needed Significant Relationships:  Adult Children Lives with:  Self Do you feel safe going back to the place where you live?  No (High fall risk.) Need for family participation in patient care:  No (Coment) (Patient able to make own decisions.)  Care giving concerns:  Patient expressed no concerns at this time.   Social Worker assessment / plan:  CSW received referral for possible SNF placement at time of discharge. CSW met with patient at bedside to confirm discharge disposition. Per patient, patient understanding of PT recommendation and would prefer to discharge to [facility] once medically stable. CSW to continue to follow and assist with discharge planning needs.  Employment status:  Retired Forensic scientist:  Other (Comment Required) ([employer]) PT Recommendations:  [place] / Referral to community resources:  [place]  Patient/Family's Response to care:  Patient understanding and agreeable to CSW plan of care.  Patient/Family's Understanding of and Emotional Response to Diagnosis, Current Treatment, and Prognosis:  Patient understanding and agreeable to CSW plan of care.  Emotional  Assessment Appearance:  Appears stated age Attitude/Demeanor/Rapport:  Other (Pleasant.) Affect (typically observed):  Flat, Quiet, Accepting, Pleasant Orientation:  Oriented to Self, Oriented to Place, Oriented to  Time, Oriented to Situation Alcohol / Substance use:  Not Applicable Psych involvement (Current and /or in the community):  No (Comment) (Not appropriate on this admission.)  Discharge Needs  Concerns to be addressed:  No discharge needs identified Readmission within the last 30 days:  No Current discharge risk:  None Barriers to Discharge:  No Barriers Identified   Caroline Sauger, LCSW 09/11/2014, 12:45 PM [phone]

## 2014-09-14 ENCOUNTER — Encounter: Payer: Self-pay | Admitting: Adult Health

## 2014-09-14 ENCOUNTER — Non-Acute Institutional Stay (SKILLED_NURSING_FACILITY): Payer: BC Managed Care – PPO | Admitting: Adult Health

## 2014-09-14 DIAGNOSIS — M1611 Unilateral primary osteoarthritis, right hip: Secondary | ICD-10-CM

## 2014-09-14 DIAGNOSIS — I10 Essential (primary) hypertension: Secondary | ICD-10-CM | POA: Diagnosis not present

## 2014-09-14 DIAGNOSIS — K5900 Constipation, unspecified: Secondary | ICD-10-CM

## 2014-09-14 DIAGNOSIS — E785 Hyperlipidemia, unspecified: Secondary | ICD-10-CM

## 2014-09-14 DIAGNOSIS — D62 Acute posthemorrhagic anemia: Secondary | ICD-10-CM

## 2014-09-14 DIAGNOSIS — R600 Localized edema: Secondary | ICD-10-CM | POA: Diagnosis not present

## 2014-09-14 DIAGNOSIS — J45909 Unspecified asthma, uncomplicated: Secondary | ICD-10-CM | POA: Diagnosis not present

## 2014-09-16 ENCOUNTER — Non-Acute Institutional Stay: Payer: BC Managed Care – PPO | Admitting: Internal Medicine

## 2014-09-16 DIAGNOSIS — R600 Localized edema: Secondary | ICD-10-CM

## 2014-09-16 DIAGNOSIS — M1611 Unilateral primary osteoarthritis, right hip: Secondary | ICD-10-CM

## 2014-09-16 DIAGNOSIS — R2681 Unsteadiness on feet: Secondary | ICD-10-CM | POA: Diagnosis not present

## 2014-09-16 DIAGNOSIS — I10 Essential (primary) hypertension: Secondary | ICD-10-CM

## 2014-09-16 DIAGNOSIS — K5900 Constipation, unspecified: Secondary | ICD-10-CM

## 2014-09-16 DIAGNOSIS — D62 Acute posthemorrhagic anemia: Secondary | ICD-10-CM

## 2014-09-16 NOTE — Progress Notes (Signed)
Patient ID: [name], female   DOB: 08-Sep-1950, 64 y.o.   MRN: [id number]     [facility]   PCP: [name], PA-C  Code Status: full code  Allergies  Allergen Reactions  . Lortab [Hydrocodone-Acetaminophen] Nausea Only and Rash    Chief Complaint  Patient presents with  . New Admit To SNF     HPI:  65 y.o. patient is here for short term rehabilitation post hospital admission from 09/08/14-09/11/14 with right hip OA. She underwent right total hip replacement. She is seen in her room today. Her pain is under control. Denies any concerns.  Review of Systems:  Constitutional: Negative for fever, chills, diaphoresis.  HENT: Negative for headache, congestion, nasal discharge.   Eyes: Negative for eye pain, blurred vision, double vision and discharge.  Respiratory: Negative for cough, shortness of breath and wheezing.   Cardiovascular: Negative for chest pain, palpitations. Has chronic leg swelling. takes furosemide on as needed basis at home Gastrointestinal: Negative for heartburn, nausea, vomiting, abdominal pain Genitourinary: Negative for dysuria, urgency, frequency, hematuria, incontinence and flank pain.  Musculoskeletal: Negative for  falls Skin: Negative for itching, rash.  Neurological: Negative for weakness,dizziness Psychiatric/Behavioral: Negative for depression, anxiety, insomnia and memory loss.    Past Medical History  Diagnosis Date  . Obesity   . Hypercholesteremia   . Hypertension since 1990's  . Abnormal Pap smear of cervix 03/2009    Colpo Biopsy CIN I with HPV  . Scoliosis   . Asthma   . Bronchitis   . Bilateral edema of lower extremity     Takes lasix if needed  . Arthritis   . Teeth grinding   . Complication of anesthesia     Pt had some asthma attack after having cervical surgery in 2007   Past Surgical History  Procedure Laterality Date  . Tonsillectomy and adenoidectomy    . Knee arthroscopy Right  1990  . Total knee arthroplasty Right 2009  . Shoulder arthroscopy Left 1994    arthritis, hips,fingers  . Appendectomy  1965  . Lumbar disc surgery  2006  . Cervical disc surgery  2007  . Back surgery  1993    Thoracic and Lumbar  . Hernia repair  1977, as child    bilateral inguinal -1 during pregnancy.  Umbilical repair as child  . Colonoscopy w/ polypectomy    . Breast biopsy Right 1990    benign cyst  . Total hip arthroplasty Right 09/08/2014    Procedure: RIGHT TOTAL HIP ARTHROPLASTY ANTERIOR APPROACH;  Surgeon: Mcarthur Rossetti, MD;  Location: [facility];  Service: Orthopedics;  Laterality: Right;   Social History:   reports that she has never smoked. She has never used smokeless tobacco. She reports that she drinks alcohol. She reports that she does not use illicit drugs.  Family History  Problem Relation Age of Onset  . Breast cancer Sister 20    bilateral mastectomy- Breast cancer   . Hypertension Mother   . Heart disease Mother   . Stroke Mother   . Heart disease Father   . Depression Father   . Alcohol abuse Father   . Stroke Maternal Grandmother   . Depression Daughter   . Emphysema Paternal Grandmother   . Breast cancer Maternal Aunt     Both aunts in 37 -3's  . Heart failure Paternal Grandfather   . Alcohol abuse Paternal Grandfather     Medications:   Medication List  This list is accurate as of: 09/16/14  3:03 PM.  Always use your most recent med list.               albuterol 108 (90 BASE) MCG/ACT inhaler  Commonly known as:  PROVENTIL HFA;VENTOLIN HFA  Inhale 2 puffs into the lungs every 6 (six) hours as needed for wheezing or shortness of breath.     aspirin 325 MG EC tablet  Take 1 tablet (325 mg total) by mouth 2 (two) times daily after a meal.     Cinnamon 500 MG Tabs  Take 500 mg by mouth 2 (two) times daily.     CoQ10 200 MG Caps  Take 200 mg by mouth every evening.     cyclobenzaprine 10 MG tablet  Commonly known as:   FLEXERIL  Take 10 mg by mouth 2 (two) times daily as needed for muscle spasms.     DAILY MULTIVITAMIN PO  Take 0.5 tablets by mouth 2 (two) times daily. With D     DUONEB 0.5-2.5 (3) MG/3ML Soln  Generic drug:  ipratropium-albuterol  Take 3 mLs by nebulization every 4 (four) hours as needed (shortness of breath).     felodipine 10 MG 24 hr tablet  Commonly known as:  PLENDIL  Take 10 mg by mouth daily.     Fish Oil 1200 MG Caps  Take 1,200 mg by mouth 2 (two) times daily.     FLAX SEED OIL PO  Take 1 capsule by mouth daily.     GARLIC PO  Take 0,175 mg by mouth daily.     Grape Seed Oil  Take 100 mg by mouth daily.     irbesartan 300 MG tablet  Commonly known as:  AVAPRO  Take 300 mg by mouth daily.     labetalol 200 MG tablet  Commonly known as:  NORMODYNE  Take 400 mg by mouth 2 (two) times daily.     LASIX 20 MG tablet  Generic drug:  furosemide  Take 20-30 mg by mouth daily as needed for fluid.     LIDAZONE HC 3-0.5 % Crea  Generic drug:  lidocaine-hydrocortisone  Place 1 Applicatorful rectally as needed (hemorrhoids).     Lutein 20 MG Caps  Take 20 mg by mouth daily.     medroxyPROGESTERone 10 MG tablet  Commonly known as:  PROVERA  Take 1 tablet (10 mg total) by mouth daily.     oxyCODONE-acetaminophen 5-325 MG per tablet  Commonly known as:  ROXICET  Take 1-2 tablets by mouth every 4 (four) hours as needed.     PROBIOTIC DAILY PO  Take 1 capsule by mouth daily.     simvastatin 20 MG tablet  Commonly known as:  ZOCOR  Take 20 mg by mouth every evening.     STOOL SOFTENER PO  Take 1 tablet by mouth daily.     triamcinolone cream 0.1 %  Commonly known as:  KENALOG  Apply 1 application topically 2 (two) times daily.     UNABLE TO FIND  Take 1,200 mg by mouth daily. Med Name: [place]     Vitamin D (Cholecalciferol) 1000 UNITS Tabs  Take 1,000 Units by mouth 2 (two) times daily.         Physical Exam: Filed Vitals:   09/16/14 1502  BP:  112/81  Pulse: 82  Temp: 98.2 F (36.8 C)  Resp: 18  SpO2: 98%    General- adult female, obese, in no acute distress Head-  normocephalic, atraumatic Nose- normal nasal mucosa, no maxillary or frontal sinus tenderness, no nasal discharge Throat- moist mucus membrane  Eyes- PERRLA, EOMI, no pallor, no icterus, no discharge, normal conjunctiva, normal sclera Neck- no cervical lymphadenopathy Cardiovascular- normal s1,s2, no murmurs, palpable dorsalis pedis and radial pulses, 1+ right and trace left leg edema Respiratory- bilateral clear to auscultation, no wheeze, no rhonchi, no crackles, no use of accessory muscles Abdomen- bowel sounds present, soft, non tender Musculoskeletal- able to move all 4 extremities, right hip limited range of motion  Neurological- no focal deficit, alert and oriented to person, place and time Skin- warm and dry, right hip surgical incision with aquacel dressing with statin Psychiatry- normal mood and affect    Labs reviewed: Basic Metabolic Panel:  Recent Labs  07/24/14 1350 08/28/14 1433 09/09/14 0436  NA 139 139 135  K 4.0 4.2 3.6  CL 104 105 101  CO2 27 26 26   GLUCOSE 111* 103* 126*  BUN 17 13 11   CREATININE 0.81 0.61 0.72  CALCIUM 9.2 9.0 8.0*   Liver Function Tests:  Recent Labs  12/03/13 1318 07/24/14 1350  AST 25 22  ALT 25 16  ALKPHOS 110 108  BILITOT 0.5 0.6  PROT 6.7 7.0  ALBUMIN 3.6 3.8   No results for input(s): LIPASE, AMYLASE in the last 8760 hours. No results for input(s): AMMONIA in the last 8760 hours. CBC:  Recent Labs  08/28/14 1433 09/09/14 0436 09/10/14 0600  WBC 6.4 9.7 9.2  HGB 13.1 10.6* 10.4*  HCT 41.5 31.5* 32.1*  MCV 91.0 89.2 89.4  PLT 189 188 183     Assessment/Plan  Unsteady gait Post OA and recent surgery. Will have her work with physical therapy and occupational therapy team to help with gait training and muscle strengthening exercises.fall precautions. Skin care. Encourage to be out of  bed.   Right hip OA S/p right hip arthroplasty. Will have patient work with PT/OT as tolerated to regain strength and restore function.  Fall precautions are in place.has f/u with orthopedics. Continue tylenol 650 mg q4h prn pain and roxicet 5-325 mg 1-2 tab q4h prn for breakthrough pain. WBAT. Continue aspirin 325 mg bid for dvt prophylaxis. Continue flexeril 10 mg bid prn muscle spasm. Continue vitamin d supplement  Blood loss anemia Post op, monitor h&h  Leg edema Likely from stasis. Refuses ted hose. Continue prn lasix for now. No other signs of fluid overload  HTN Stable, continue felodipine 10 mg daily, labetalol 400 mg bid and avapro 300 mg daily. monitor bp  Constipation Continue colace 200 mg qhs, hydration encouraged   Goals of care: short term rehabilitation   Labs/tests ordered: cbc  Family/ staff Communication: reviewed care plan with patient and nursing supervisor    Blanchie Serve, MD  [facility] [phone] (Monday-Friday 8 am - 5 pm) [phone] (afterhours)

## 2014-09-17 ENCOUNTER — Non-Acute Institutional Stay (SKILLED_NURSING_FACILITY): Payer: BC Managed Care – PPO | Admitting: Adult Health

## 2014-09-17 ENCOUNTER — Encounter: Payer: Self-pay | Admitting: Adult Health

## 2014-09-17 DIAGNOSIS — M1611 Unilateral primary osteoarthritis, right hip: Secondary | ICD-10-CM | POA: Diagnosis not present

## 2014-09-17 DIAGNOSIS — J45909 Unspecified asthma, uncomplicated: Secondary | ICD-10-CM | POA: Diagnosis not present

## 2014-09-17 DIAGNOSIS — I10 Essential (primary) hypertension: Secondary | ICD-10-CM

## 2014-09-17 DIAGNOSIS — D62 Acute posthemorrhagic anemia: Secondary | ICD-10-CM | POA: Diagnosis not present

## 2014-09-17 DIAGNOSIS — R600 Localized edema: Secondary | ICD-10-CM

## 2014-09-17 DIAGNOSIS — E785 Hyperlipidemia, unspecified: Secondary | ICD-10-CM

## 2014-09-17 DIAGNOSIS — K5900 Constipation, unspecified: Secondary | ICD-10-CM | POA: Diagnosis not present

## 2014-09-17 NOTE — Progress Notes (Addendum)
Patient ID: [name], female   DOB: [birth year], 64 y.o.   MRN: [id number]    DATE:  09/17/14 MRN:  [id number]  BIRTHDAY: [birth year]  Facility[facility] Location:  [facility] Room Number: 1204-P  LEVEL OF CARE:  SNF [phone])  Contact Information    Name Relation Home Work Mobile   [place] Daughter [phone]  [phone]   [place], [place] [phone]         Chief Complaint  Patient presents with  . Discharge Note    Osteoarthritis S/P right total hip replacement, asthma, hypertension, hyperlipidemia, constipation, bilateral lower extremity edema and anemia    HISTORY OF PRESENT ILLNESS:  This is a 64 year old female who is for discharge home with Home health PT and OT. She been admitted to [facility] on 09/11/14 from [facility]. She has PMH of hypercholesterolemia, hypertension, scoliosis and asthma. She has osteoarthritis for which she had right total hip replacement on 09/08/14.  Patient was admitted to this facility for short-term rehabilitation after the patient's recent hospitalization.  Patient has completed SNF rehabilitation and therapy has cleared the patient for discharge.   PAST MEDICAL HISTORY:  Past Medical History  Diagnosis Date  . Obesity   . Hypercholesteremia   . Hypertension since 1990's  . Abnormal Pap smear of cervix 03/2009    Colpo Biopsy CIN I with HPV  . Scoliosis   . Asthma   . Bronchitis   . Bilateral edema of lower extremity     Takes lasix if needed  . Arthritis   . Teeth grinding   . Complication of anesthesia     Pt had some asthma attack after having cervical surgery in 2007     CURRENT MEDICATIONS: Reviewed     Medication List       This list is accurate as of: 09/17/14  6:40 PM.  Always use your most recent med list.               albuterol 108 (90 BASE) MCG/ACT inhaler  Commonly known as:  PROVENTIL HFA;VENTOLIN HFA  Inhale 2 puffs into the lungs every 6 (six)  hours as needed for wheezing or shortness of breath.     aspirin 325 MG EC tablet  Take 1 tablet (325 mg total) by mouth 2 (two) times daily after a meal.     cyclobenzaprine 10 MG tablet  Commonly known as:  FLEXERIL  Take 10 mg by mouth 2 (two) times daily as needed for muscle spasms.     DUONEB 0.5-2.5 (3) MG/3ML Soln  Generic drug:  ipratropium-albuterol  Take 3 mLs by nebulization every 4 (four) hours as needed (shortness of breath).     felodipine 10 MG 24 hr tablet  Commonly known as:  PLENDIL  Take 10 mg by mouth daily.     irbesartan 300 MG tablet  Commonly known as:  AVAPRO  Take 300 mg by mouth daily.     labetalol 200 MG tablet  Commonly known as:  NORMODYNE  Take 400 mg by mouth 2 (two) times daily.     LASIX 20 MG tablet  Generic drug:  furosemide  Take 20-30 mg by mouth daily as needed for fluid.     LIDAZONE HC 3-0.5 % Crea  Generic drug:  lidocaine-hydrocortisone  Place 1 Applicatorful rectally as needed (hemorrhoids).     oxyCODONE-acetaminophen 5-325 MG per tablet  Commonly known as:  ROXICET  Take 1-2 tablets by  mouth every 4 (four) hours as needed.     simvastatin 20 MG tablet  Commonly known as:  ZOCOR  Take 20 mg by mouth every evening.     triamcinolone cream 0.1 %  Commonly known as:  KENALOG  Apply 1 application topically 2 (two) times daily.         Allergies  Allergen Reactions  . Lortab [Hydrocodone-Acetaminophen] Nausea Only and Rash     REVIEW OF SYSTEMS:  GENERAL: no change in appetite, no fatigue, no weight changes, no fever, chills or weakness EYES: Denies change in vision, dry eyes, eye pain, itching or discharge EARS: Denies change in hearing, ringing in ears, or earache NOSE: Denies nasal congestion or epistaxis MOUTH and THROAT: Denies oral discomfort, gingival pain or bleeding, pain from teeth or hoarseness   RESPIRATORY: no cough, SOB, DOE, wheezing, hemoptysis CARDIAC: no chest pain, edema or palpitations GI: no  abdominal pain, diarrhea, constipation, heart burn, nausea or vomiting GU: Denies dysuria, frequency, hematuria, incontinence, or discharge PSYCHIATRIC: Denies feeling of depression or anxiety. No report of hallucinations, insomnia, paranoia, or agitation   PHYSICAL EXAMINATION  GENERAL APPEARANCE: Well nourished. In no acute distress. Obese SKIN:  Right hip surgical incision has Aquacel dressing, no erythema  HEAD: Normal in size and contour. No evidence of trauma EYES: Lids open and close normally. No blepharitis, entropion or ectropion. PERRL. Conjunctivae are clear and sclerae are white. Lenses are without opacity EARS: Pinnae are normal. Patient hears normal voice tunes of the examiner MOUTH and THROAT: Lips are without lesions. Oral mucosa is moist and without lesions. Tongue is normal in shape, size, and color and without lesions NECK: supple, trachea midline, no neck masses, no thyroid tenderness, no thyromegaly LYMPHATICS: no LAN in the neck, no supraclavicular LAN RESPIRATORY: breathing is even & unlabored, BS CTAB CARDIAC: RRR, no murmur,no extra heart sounds, no edema GI: abdomen soft, normal BS, no masses, no tenderness, no hepatomegaly, no splenomegaly EXTREMITIES:  Able to move 4 extremities, left foot has hard-soled shoe PSYCHIATRIC: Alert and oriented X 3. Affect and behavior are appropriate  LABS/RADIOLOGY: Labs reviewed: Basic Metabolic Panel:  Recent Labs  07/24/14 1350 08/28/14 1433 09/09/14 0436  NA 139 139 135  K 4.0 4.2 3.6  CL 104 105 101  CO2 27 26 26   GLUCOSE 111* 103* 126*  BUN 17 13 11   CREATININE 0.81 0.61 0.72  CALCIUM 9.2 9.0 8.0*   Liver Function Tests:  Recent Labs  12/03/13 1318 07/24/14 1350  AST 25 22  ALT 25 16  ALKPHOS 110 108  BILITOT 0.5 0.6  PROT 6.7 7.0  ALBUMIN 3.6 3.8   CBC:  Recent Labs  08/28/14 1433 09/09/14 0436 09/10/14 0600  WBC 6.4 9.7 9.2  HGB 13.1 10.6* 10.4*  HCT 41.5 31.5* 32.1*  MCV 91.0 89.2 89.4    PLT 189 188 183     Dg Hip Port Unilat With Pelvis 1v Right  09/08/2014   CLINICAL DATA:  Total right hip replacement .  EXAM: DG HIP (WITH OR WITHOUT PELVIS) 1V PORT RIGHT  COMPARISON:  09/08/2014.  FINDINGS: Total right hip replacement good anatomic alignment. Hardware intact. No acute bony abnormality P  IMPRESSION: Total right hip replacement with good anatomic alignment.   Electronically Signed   By: Marcello Moores  Register   On: 09/08/2014 15:54   Dg Hip Operative Unilat With Pelvis Right  09/08/2014   CLINICAL DATA:  Intraoperative images from right hip arthroplasty.  EXAM: OPERATIVE RIGHT HIP (  WITH PELVIS IF PERFORMED)  VIEWS  TECHNIQUE: Fluoroscopic spot image(s) were submitted for interpretation post-operatively.  COMPARISON:  None.  FINDINGS: Two intraoperative fluoroscopic frontal images of the right hip are presented for interpretation. Right total hip arthroplasty with long stem femoral component is seen. The hardware is well seated. There is no evidence of fracture.  Total fluoroscopy time is 28 seconds.  IMPRESSION: Right hip arthroplasty without evidence of immediate complications.   Electronically Signed   By: Fidela Salisbury M.D.   On: 09/08/2014 15:27    ASSESSMENT/PLAN:  Osteoarthritis S/P right total hip replacement - for home health PT and OT; continue Flexeril 10 mg 1 tab by mouth twice a day when necessary for muscle spasm; aspirin 325 mg 1 tab by mouth twice a day for DVT prophylaxis; and Percocet 5/325 mg 1-2 tabs by mouth every 4 hours when necessary for pain  Asthma - continue Proventil HFA inhaler 2 puffs into the lungs every 6 hours when necessary and DuoNeb when necessary  Hypertension - continue Plendil 10 mg 24 hour 1 tab by mouth daily, Avapro 300 mg 1 tab by mouth daily and labetalol 200 mg 2 tabs = 400 mg by mouth twice a day; check BMP  Hyperlipidemia - continue simvastatin 20 mg 1 tab by mouth every day  Constipation - continue Colace to 100 mg take 2 capsules  = 200 mg by mouth daily at bedtime  Bilateral lower extremity edema - continue Lasix 20-30 mg  by mouth daily when necessary  Anemia, acute blood loss - hemoglobin 10.2; awaiting result of repeat CBC (not in chart)     I have filled out patient's discharge paperwork and written prescriptions.  Patient will receive home health PT and OT.  Total discharge time: Less than 30 minutes  Discharge time involved coordination of the discharge process with Education officer, museum, nursing staff and therapy department. Medical justification for home health services verified.    [facility], NP [employer] [phone]

## 2014-09-17 NOTE — Progress Notes (Addendum)
Patient ID: [name], female   DOB: January 08, 1950, 64 y.o.   MRN: [id number]    DATE:  09/14/14 MRN:  [id number]  BIRTHDAY: [birth year]  Facility:  Nursing Home Location:  [place] Room Number: 1204-P  LEVEL OF CARE:  SNF [phone])  Contact Information    Name Relation Home Work Mobile   [place] Daughter [phone]  [phone]   Eilleen, Davoli [phone]         Chief Complaint  Patient presents with  . Hospitalization Follow-up    Osteoarthritis S/P right total hip replacement, asthma, hypertension, hyperlipidemia, constipation, bilateral lower extremity edema and anemia    HISTORY OF PRESENT ILLNESS:  This is a 64 year old female who was been admitted to [facility] on 09/11/14 from [facility]. She has PMH of hypercholesterolemia, hypertension, scoliosis and asthma. She has osteoarthritis for which she had right total hip replacement on 09/08/14.  She has been admitted for a short-term rehabilitation.  PAST MEDICAL HISTORY:  Past Medical History  Diagnosis Date  . Obesity   . Hypercholesteremia   . Hypertension since 1990's  . Abnormal Pap smear of cervix 03/2009    Colpo Biopsy CIN I with HPV  . Scoliosis   . Asthma   . Bronchitis   . Bilateral edema of lower extremity     Takes lasix if needed  . Arthritis   . Teeth grinding   . Complication of anesthesia     Pt had some asthma attack after having cervical surgery in 2007     CURRENT MEDICATIONS: Reviewed  Patient's Medications  New Prescriptions   No medications on file  Previous Medications   ALBUTEROL (PROVENTIL HFA;VENTOLIN HFA) 108 (90 BASE) MCG/ACT INHALER    Inhale 2 puffs into the lungs every 6 (six) hours as needed for wheezing or shortness of breath.   ASPIRIN EC 325 MG EC TABLET    Take 1 tablet (325 mg total) by mouth 2 (two) times daily after a meal.   CINNAMON 500 MG TABS    Take 500 mg by mouth 2 (two) times daily.   COENZYME Q10 (COQ10)  200 MG CAPS    Take 200 mg by mouth every evening.    CYCLOBENZAPRINE (FLEXERIL) 10 MG TABLET    Take 10 mg by mouth 2 (two) times daily as needed for muscle spasms.    DOCUSATE CALCIUM (STOOL SOFTENER PO)    Take 1 tablet by mouth daily.    FELODIPINE (PLENDIL) 10 MG 24 HR TABLET    Take 10 mg by mouth daily.   FLAXSEED, LINSEED, (FLAX SEED OIL PO)    Take 1 capsule by mouth daily.    FUROSEMIDE (LASIX) 20 MG TABLET    Take 20-30 mg by mouth daily as needed for fluid.    GARLIC PO    Take 4,196 mg by mouth daily.    GRAPE SEED OIL    Take 100 mg by mouth daily.    IPRATROPIUM-ALBUTEROL (DUONEB) 0.5-2.5 (3) MG/3ML SOLN    Take 3 mLs by nebulization every 4 (four) hours as needed (shortness of breath).    IRBESARTAN (AVAPRO) 300 MG TABLET    Take 300 mg by mouth daily.   LABETALOL (NORMODYNE) 200 MG TABLET    Take 400 mg by mouth 2 (two) times daily.    LIDOCAINE-HYDROCORTISONE (LIDAZONE HC) 3-0.5 % CREA    Place 1 Applicatorful rectally as needed (hemorrhoids).    LUTEIN 20 MG CAPS  Take 20 mg by mouth daily.    MEDROXYPROGESTERONE (PROVERA) 10 MG TABLET    Take 1 tablet (10 mg total) by mouth daily.   MULTIPLE VITAMIN (DAILY MULTIVITAMIN PO)    Take 0.5 tablets by mouth 2 (two) times daily. With D   OMEGA-3 FATTY ACIDS (FISH OIL) 1200 MG CAPS    Take 1,200 mg by mouth 2 (two) times daily.    OXYCODONE-ACETAMINOPHEN (ROXICET) 5-325 MG PER TABLET    Take 1-2 tablets by mouth every 4 (four) hours as needed.   PROBIOTIC PRODUCT (PROBIOTIC DAILY PO)    Take 1 capsule by mouth daily.    SIMVASTATIN (ZOCOR) 20 MG TABLET    Take 20 mg by mouth every evening.    TRIAMCINOLONE CREAM (KENALOG) 0.1 %    Apply 1 application topically 2 (two) times daily.   UNABLE TO FIND    Take 1,200 mg by mouth daily. Med Name: [place]   VITAMIN D, CHOLECALCIFEROL, 1000 UNITS TABS    Take 1,000 Units by mouth 2 (two) times daily.   Modified Medications   No medications on file  Discontinued Medications   No  medications on file     Allergies  Allergen Reactions  . Lortab [Hydrocodone-Acetaminophen] Nausea Only and Rash     REVIEW OF SYSTEMS:  GENERAL: no change in appetite, no fatigue, no weight changes, no fever, chills or weakness EYES: Denies change in vision, dry eyes, eye pain, itching or discharge EARS: Denies change in hearing, ringing in ears, or earache NOSE: Denies nasal congestion or epistaxis MOUTH and THROAT: Denies oral discomfort, gingival pain or bleeding, pain from teeth or hoarseness   RESPIRATORY: no cough, SOB, DOE, wheezing, hemoptysis CARDIAC: no chest pain, edema or palpitations GI: no abdominal pain, diarrhea, constipation, heart burn, nausea or vomiting GU: Denies dysuria, frequency, hematuria, incontinence, or discharge PSYCHIATRIC: Denies feeling of depression or anxiety. No report of hallucinations, insomnia, paranoia, or agitation   PHYSICAL EXAMINATION  GENERAL APPEARANCE: Well nourished. In no acute distress. Obese SKIN:  Right hip surgical incision has Aquacel dressing, no erythema  HEAD: Normal in size and contour. No evidence of trauma EYES: Lids open and close normally. No blepharitis, entropion or ectropion. PERRL. Conjunctivae are clear and sclerae are white. Lenses are without opacity EARS: Pinnae are normal. Patient hears normal voice tunes of the examiner MOUTH and THROAT: Lips are without lesions. Oral mucosa is moist and without lesions. Tongue is normal in shape, size, and color and without lesions NECK: supple, trachea midline, no neck masses, no thyroid tenderness, no thyromegaly LYMPHATICS: no LAN in the neck, no supraclavicular LAN RESPIRATORY: breathing is even & unlabored, BS CTAB CARDIAC: RRR, no murmur,no extra heart sounds, no edema GI: abdomen soft, normal BS, no masses, no tenderness, no hepatomegaly, no splenomegaly EXTREMITIES:  Able to move 4 extremities, left foot has hard-soled shoe PSYCHIATRIC: Alert and oriented X 3.  Affect and behavior are appropriate  LABS/RADIOLOGY: Labs reviewed: Basic Metabolic Panel:  Recent Labs  07/24/14 1350 08/28/14 1433 09/09/14 0436  NA 139 139 135  K 4.0 4.2 3.6  CL 104 105 101  CO2 27 26 26   GLUCOSE 111* 103* 126*  BUN 17 13 11   CREATININE 0.81 0.61 0.72  CALCIUM 9.2 9.0 8.0*   Liver Function Tests:  Recent Labs  12/03/13 1318 07/24/14 1350  AST 25 22  ALT 25 16  ALKPHOS 110 108  BILITOT 0.5 0.6  PROT 6.7 7.0  ALBUMIN 3.6 3.8  CBC:  Recent Labs  08/28/14 1433 09/09/14 0436 09/10/14 0600  WBC 6.4 9.7 9.2  HGB 13.1 10.6* 10.4*  HCT 41.5 31.5* 32.1*  MCV 91.0 89.2 89.4  PLT 189 188 183     Dg Hip Port Unilat With Pelvis 1v Right  09/08/2014   CLINICAL DATA:  Total right hip replacement .  EXAM: DG HIP (WITH OR WITHOUT PELVIS) 1V PORT RIGHT  COMPARISON:  09/08/2014.  FINDINGS: Total right hip replacement good anatomic alignment. Hardware intact. No acute bony abnormality P  IMPRESSION: Total right hip replacement with good anatomic alignment.   Electronically Signed   By: Marcello Moores  Register   On: 09/08/2014 15:54   Dg Hip Operative Unilat With Pelvis Right  09/08/2014   CLINICAL DATA:  Intraoperative images from right hip arthroplasty.  EXAM: OPERATIVE RIGHT HIP (WITH PELVIS IF PERFORMED)  VIEWS  TECHNIQUE: Fluoroscopic spot image(s) were submitted for interpretation post-operatively.  COMPARISON:  None.  FINDINGS: Two intraoperative fluoroscopic frontal images of the right hip are presented for interpretation. Right total hip arthroplasty with long stem femoral component is seen. The hardware is well seated. There is no evidence of fracture.  Total fluoroscopy time is 28 seconds.  IMPRESSION: Right hip arthroplasty without evidence of immediate complications.   Electronically Signed   By: Fidela Salisbury M.D.   On: 09/08/2014 15:27    ASSESSMENT/PLAN:  Osteoarthritis S/P right total hip replacement - for rehabilitation; continue Flexeril 10 mg  1 tab by mouth twice a day when necessary for muscle spasm; aspirin 325 mg 1 tab by mouth twice a day for DVT prophylaxis; and Percocet 5/325 mg 1-2 tabs by mouth every 4 hours when necessary for pain  Asthma - continue Proventil HFA inhaler 2 puffs into the lungs every 6 hours when necessary and DuoNeb when necessary  Hypertension - continue Plendil 10 mg 24 hour 1 tab by mouth daily, Avapro 300 mg 1 tab by mouth daily and labetalol 200 mg 2 tabs = 400 mg by mouth twice a day; check BMP  Hyperlipidemia - continue simvastatin 20 mg 1 tab by mouth every day  Constipation - change Colace to 100 mg take 2 capsules = 200 mg by mouth daily at bedtime  Bilateral lower extremity edema - continue Lasix 20-30 mg  by mouth daily when necessary  Anemia, acute blood loss - hemoglobin 10.2; check CBC    Goals of care:  Short-term rehabilitation    [facility] [place], [facility] [phone]

## 2014-10-15 ENCOUNTER — Other Ambulatory Visit (HOSPITAL_COMMUNITY): Payer: Self-pay | Admitting: Orthopaedic Surgery

## 2014-10-22 ENCOUNTER — Encounter (HOSPITAL_COMMUNITY)
Admission: RE | Admit: 2014-10-22 | Discharge: 2014-10-22 | Disposition: A | Discharge status: Home / Self Care | Payer: BC Managed Care – PPO | Source: Ambulatory Visit | Attending: Orthopaedic Surgery | Admitting: Orthopaedic Surgery

## 2014-10-22 ENCOUNTER — Encounter (HOSPITAL_COMMUNITY): Payer: Self-pay

## 2014-10-22 DIAGNOSIS — Z79899 Other long term (current) drug therapy: Secondary | ICD-10-CM | POA: Diagnosis not present

## 2014-10-22 DIAGNOSIS — Z01818 Encounter for other preprocedural examination: Secondary | ICD-10-CM | POA: Diagnosis not present

## 2014-10-22 DIAGNOSIS — Z96641 Presence of right artificial hip joint: Secondary | ICD-10-CM | POA: Diagnosis not present

## 2014-10-22 DIAGNOSIS — Z96651 Presence of right artificial knee joint: Secondary | ICD-10-CM | POA: Insufficient documentation

## 2014-10-22 DIAGNOSIS — M1612 Unilateral primary osteoarthritis, left hip: Secondary | ICD-10-CM | POA: Diagnosis not present

## 2014-10-22 DIAGNOSIS — J45909 Unspecified asthma, uncomplicated: Secondary | ICD-10-CM | POA: Insufficient documentation

## 2014-10-22 DIAGNOSIS — Z981 Arthrodesis status: Secondary | ICD-10-CM | POA: Diagnosis not present

## 2014-10-22 DIAGNOSIS — Z01812 Encounter for preprocedural laboratory examination: Secondary | ICD-10-CM | POA: Diagnosis not present

## 2014-10-22 DIAGNOSIS — Z7982 Long term (current) use of aspirin: Secondary | ICD-10-CM | POA: Insufficient documentation

## 2014-10-22 DIAGNOSIS — E7800 Pure hypercholesterolemia, unspecified: Secondary | ICD-10-CM | POA: Diagnosis not present

## 2014-10-22 DIAGNOSIS — I10 Essential (primary) hypertension: Secondary | ICD-10-CM | POA: Insufficient documentation

## 2014-10-22 LAB — CBC
HCT: 42.1 % (ref 36.0–46.0)
Hemoglobin: 13.2 g/dL (ref 12.0–15.0)
MCH: 28.1 pg (ref 26.0–34.0)
MCHC: 31.4 g/dL (ref 30.0–36.0)
MCV: 89.8 fL (ref 78.0–100.0)
PLATELETS: 256 10*3/uL (ref 150–400)
RBC: 4.69 MIL/uL (ref 3.87–5.11)
RDW: 13.7 % (ref 11.5–15.5)
WBC: 8 10*3/uL (ref 4.0–10.5)

## 2014-10-22 LAB — BASIC METABOLIC PANEL
Anion gap: 8 (ref 5–15)
BUN: 12 mg/dL (ref 6–20)
CALCIUM: 9.1 mg/dL (ref 8.9–10.3)
CHLORIDE: 106 mmol/L (ref 101–111)
CO2: 26 mmol/L (ref 22–32)
CREATININE: 0.68 mg/dL (ref 0.44–1.00)
GFR calc non Af Amer: >60 mL/min (ref >60)
GLUCOSE: 94 mg/dL (ref 65–99)
Potassium: 4.1 mmol/L (ref 3.5–5.1)
Sodium: 140 mmol/L (ref 135–145)

## 2014-10-22 LAB — SURGICAL PCR SCREEN
MRSA, PCR: NEGATIVE
Staphylococcus aureus: NEGATIVE

## 2014-10-23 NOTE — Progress Notes (Signed)
Anesthesia Chart Review:  Pt is 64 year old female scheduled for L total hip arthroplasty anterior approach on 11/02/2014 with Dr. Kathrynn Speed.   PMH includes: HTN, hypercholesterolemia, scoliosis, asthma, teeth grinding, LE edema (Lasix PRN), T&A, cervical (ACDF) and lumbar surgeries, right TKA '09. S/p R THA 09/08/14. Never smoker. BMI 44.   Medications include: albuterol, ASA, felodipine, lasix, duoneb, irbesartan, labetalol, simvastatin.   Preoperative labs reviewed.    EKG 07/24/14: NSR, possible anterior infarct (age undetermined). Currently, there is no comparison EKG available. No CV symptoms documented from her PAT visit. No known DM, smoking, or CAD/MI history.   Tolerated THA less than 2 months ago. If she remains asymptomatic from a CV standpoint then I would anticipate that she could proceed as planned.   If no changes, I anticipate pt can proceed with surgery as scheduled.   Willeen Cass, FNP-BC [facility]/Anesthesiology Phone: [phone] 10/23/2014 2:41 PM

## 2014-10-30 MED ORDER — CEFAZOLIN SODIUM-DEXTROSE 2-3 GM-% IV SOLR
2.0000 g | INTRAVENOUS | Status: AC
  Administered 2014-11-02: 3 g via INTRAVENOUS
  Filled 2014-10-30: qty 50

## 2014-10-30 MED ORDER — TRANEXAMIC ACID 1000 MG/10ML IV SOLN
1000.0000 mg | INTRAVENOUS | Status: AC
  Administered 2014-11-02: 1000 mg via INTRAVENOUS
  Administered 2014-11-02: 15:00:00 via INTRAVENOUS
  Filled 2014-10-30: qty 10

## 2014-11-02 ENCOUNTER — Encounter (HOSPITAL_COMMUNITY)
Admission: RE | Disposition: A | Discharge status: Home Health | Payer: Self-pay | Source: Ambulatory Visit | Attending: Orthopaedic Surgery

## 2014-11-02 ENCOUNTER — Inpatient Hospital Stay (HOSPITAL_COMMUNITY): Payer: BC Managed Care – PPO

## 2014-11-02 ENCOUNTER — Inpatient Hospital Stay (HOSPITAL_COMMUNITY): Payer: BC Managed Care – PPO | Admitting: Emergency Medicine

## 2014-11-02 ENCOUNTER — Encounter (HOSPITAL_COMMUNITY): Payer: Self-pay | Admitting: *Deleted

## 2014-11-02 ENCOUNTER — Inpatient Hospital Stay (HOSPITAL_COMMUNITY)
Admission: RE | Admit: 2014-11-02 | Discharge: 2014-11-06 | DRG: 470 | Disposition: A | Discharge status: Home Health | Payer: BC Managed Care – PPO | Source: Ambulatory Visit | Attending: Orthopaedic Surgery | Admitting: Orthopaedic Surgery

## 2014-11-02 ENCOUNTER — Inpatient Hospital Stay (HOSPITAL_COMMUNITY): Payer: BC Managed Care – PPO | Admitting: Certified Registered Nurse Anesthetist

## 2014-11-02 DIAGNOSIS — Z96641 Presence of right artificial hip joint: Secondary | ICD-10-CM | POA: Diagnosis present

## 2014-11-02 DIAGNOSIS — J45909 Unspecified asthma, uncomplicated: Secondary | ICD-10-CM | POA: Diagnosis present

## 2014-11-02 DIAGNOSIS — M1612 Unilateral primary osteoarthritis, left hip: Secondary | ICD-10-CM | POA: Diagnosis present

## 2014-11-02 DIAGNOSIS — Z6841 Body Mass Index (BMI) 40.0 and over, adult: Secondary | ICD-10-CM | POA: Diagnosis not present

## 2014-11-02 DIAGNOSIS — Z818 Family history of other mental and behavioral disorders: Secondary | ICD-10-CM | POA: Diagnosis not present

## 2014-11-02 DIAGNOSIS — Z8249 Family history of ischemic heart disease and other diseases of the circulatory system: Secondary | ICD-10-CM | POA: Diagnosis not present

## 2014-11-02 DIAGNOSIS — Z823 Family history of stroke: Secondary | ICD-10-CM

## 2014-11-02 DIAGNOSIS — Z419 Encounter for procedure for purposes other than remedying health state, unspecified: Secondary | ICD-10-CM

## 2014-11-02 DIAGNOSIS — I10 Essential (primary) hypertension: Secondary | ICD-10-CM | POA: Diagnosis present

## 2014-11-02 DIAGNOSIS — Z96642 Presence of left artificial hip joint: Secondary | ICD-10-CM

## 2014-11-02 DIAGNOSIS — D62 Acute posthemorrhagic anemia: Secondary | ICD-10-CM | POA: Diagnosis not present

## 2014-11-02 DIAGNOSIS — K219 Gastro-esophageal reflux disease without esophagitis: Secondary | ICD-10-CM | POA: Diagnosis present

## 2014-11-02 DIAGNOSIS — Z825 Family history of asthma and other chronic lower respiratory diseases: Secondary | ICD-10-CM

## 2014-11-02 DIAGNOSIS — E7800 Pure hypercholesterolemia, unspecified: Secondary | ICD-10-CM | POA: Diagnosis present

## 2014-11-02 DIAGNOSIS — M25552 Pain in left hip: Secondary | ICD-10-CM | POA: Diagnosis present

## 2014-11-02 HISTORY — DX: Other intervertebral disc displacement, lumbar region: M51.26

## 2014-11-02 HISTORY — DX: Unspecified thoracic, thoracolumbar and lumbosacral intervertebral disc disorder: M51.9

## 2014-11-02 HISTORY — DX: Spinal stenosis, site unspecified: M48.00

## 2014-11-02 HISTORY — PX: TOTAL HIP ARTHROPLASTY: SHX124

## 2014-11-02 SURGERY — ARTHROPLASTY, HIP, TOTAL, ANTERIOR APPROACH
Anesthesia: General | Site: Hip | Laterality: Left

## 2014-11-02 MED ORDER — SUGAMMADEX SODIUM 200 MG/2ML IV SOLN
INTRAVENOUS | Status: AC
  Filled 2014-11-02: qty 2

## 2014-11-02 MED ORDER — HYDROMORPHONE HCL 1 MG/ML IJ SOLN
INTRAMUSCULAR | Status: AC
  Filled 2014-11-02: qty 1

## 2014-11-02 MED ORDER — ACETAMINOPHEN 325 MG PO TABS: 325.0000 mg | ORAL_TABLET | ORAL | Status: DC | PRN

## 2014-11-02 MED ORDER — OXYCODONE HCL 5 MG PO TABS
ORAL_TABLET | ORAL | Status: AC
  Filled 2014-11-02: qty 2

## 2014-11-02 MED ORDER — VITAMIN C 500 MG PO TABS
500.0000 mg | ORAL_TABLET | Freq: Every day | ORAL | Status: DC
  Administered 2014-11-02 – 2014-11-06 (×5): 500 mg via ORAL
  Filled 2014-11-02 (×5): qty 1

## 2014-11-02 MED ORDER — PROPOFOL 10 MG/ML IV BOLUS
INTRAVENOUS | Status: DC | PRN
  Administered 2014-11-02: 140 mg via INTRAVENOUS

## 2014-11-02 MED ORDER — ONDANSETRON HCL 4 MG/2ML IJ SOLN
INTRAMUSCULAR | Status: DC | PRN
  Administered 2014-11-02: 4 mg via INTRAVENOUS

## 2014-11-02 MED ORDER — 0.9 % SODIUM CHLORIDE (POUR BTL) OPTIME
TOPICAL | Status: DC | PRN
  Administered 2014-11-02: 1000 mL

## 2014-11-02 MED ORDER — ONDANSETRON HCL 4 MG/2ML IJ SOLN
INTRAMUSCULAR | Status: AC
  Filled 2014-11-02: qty 2

## 2014-11-02 MED ORDER — FELODIPINE ER 10 MG PO TB24
10.0000 mg | ORAL_TABLET | Freq: Every day | ORAL | Status: DC
  Administered 2014-11-03 – 2014-11-04 (×2): 10 mg via ORAL
  Filled 2014-11-02 (×5): qty 1

## 2014-11-02 MED ORDER — SUGAMMADEX SODIUM 200 MG/2ML IV SOLN
INTRAVENOUS | Status: DC | PRN
  Administered 2014-11-02: 200 mg via INTRAVENOUS

## 2014-11-02 MED ORDER — DEXTROSE 5 % IV SOLN
500.0000 mg | Freq: Four times a day (QID) | INTRAVENOUS | Status: DC | PRN
  Filled 2014-11-02: qty 5

## 2014-11-02 MED ORDER — CO Q 10 100 MG PO CAPS: 200.0000 mg | ORAL_CAPSULE | Freq: Every day | ORAL | Status: DC

## 2014-11-02 MED ORDER — SODIUM CHLORIDE 0.9 % IV SOLN
INTRAVENOUS | Status: DC
  Administered 2014-11-03: 75 mL/h via INTRAVENOUS

## 2014-11-02 MED ORDER — HYDROMORPHONE HCL 1 MG/ML IJ SOLN
INTRAMUSCULAR | Status: AC
  Administered 2014-11-02: 0.5 mg via INTRAVENOUS
  Filled 2014-11-02: qty 2

## 2014-11-02 MED ORDER — HYDROMORPHONE HCL 1 MG/ML IJ SOLN
0.2500 mg | INTRAMUSCULAR | Status: DC | PRN
  Administered 2014-11-02 (×4): 0.5 mg via INTRAVENOUS

## 2014-11-02 MED ORDER — SIMVASTATIN 20 MG PO TABS
20.0000 mg | ORAL_TABLET | Freq: Every evening | ORAL | Status: DC
  Administered 2014-11-02 – 2014-11-05 (×4): 20 mg via ORAL
  Filled 2014-11-02 (×5): qty 1

## 2014-11-02 MED ORDER — PROPOFOL 10 MG/ML IV BOLUS
INTRAVENOUS | Status: AC
  Filled 2014-11-02: qty 20

## 2014-11-02 MED ORDER — SODIUM CHLORIDE 0.9 % IR SOLN
Status: DC | PRN
  Administered 2014-11-02: 3000 mL

## 2014-11-02 MED ORDER — HYDROMORPHONE HCL 1 MG/ML IJ SOLN
0.5000 mg | INTRAMUSCULAR | Status: DC | PRN
  Administered 2014-11-02 (×2): 0.5 mg via INTRAVENOUS

## 2014-11-02 MED ORDER — LECITHIN 1200 MG PO CAPS: 1200.0000 mg | ORAL_CAPSULE | Freq: Every day | ORAL | Status: DC

## 2014-11-02 MED ORDER — DIPHENHYDRAMINE HCL 12.5 MG/5ML PO ELIX: 12.5000 mg | ORAL_SOLUTION | ORAL | Status: DC | PRN

## 2014-11-02 MED ORDER — METOCLOPRAMIDE HCL 5 MG/ML IJ SOLN: 5.0000 mg | Freq: Three times a day (TID) | INTRAMUSCULAR | Status: DC | PRN

## 2014-11-02 MED ORDER — ZOLPIDEM TARTRATE 5 MG PO TABS: 5.0000 mg | ORAL_TABLET | Freq: Every evening | ORAL | Status: DC | PRN

## 2014-11-02 MED ORDER — LACTATED RINGERS IV SOLN
INTRAVENOUS | Status: DC
  Administered 2014-11-02: 14:00:00 via INTRAVENOUS

## 2014-11-02 MED ORDER — LABETALOL HCL 200 MG PO TABS
400.0000 mg | ORAL_TABLET | Freq: Two times a day (BID) | ORAL | Status: DC
  Administered 2014-11-02 – 2014-11-05 (×7): 400 mg via ORAL
  Filled 2014-11-02 (×11): qty 2

## 2014-11-02 MED ORDER — ACETAMINOPHEN 160 MG/5ML PO SOLN: 325.0000 mg | ORAL | Status: DC | PRN

## 2014-11-02 MED ORDER — ROCURONIUM BROMIDE 50 MG/5ML IV SOLN
INTRAVENOUS | Status: AC
  Filled 2014-11-02: qty 2

## 2014-11-02 MED ORDER — CEFAZOLIN SODIUM-DEXTROSE 2-3 GM-% IV SOLR
2.0000 g | Freq: Four times a day (QID) | INTRAVENOUS | Status: AC
  Administered 2014-11-02 – 2014-11-03 (×2): 2 g via INTRAVENOUS
  Filled 2014-11-02 (×2): qty 50

## 2014-11-02 MED ORDER — IRBESARTAN 300 MG PO TABS
300.0000 mg | ORAL_TABLET | Freq: Every day | ORAL | Status: DC
  Administered 2014-11-02 – 2014-11-06 (×5): 300 mg via ORAL
  Filled 2014-11-02 (×5): qty 1

## 2014-11-02 MED ORDER — OXYCODONE HCL 5 MG PO TABS
5.0000 mg | ORAL_TABLET | ORAL | Status: DC | PRN
  Administered 2014-11-02: 5 mg via ORAL
  Administered 2014-11-02 – 2014-11-04 (×4): 10 mg via ORAL
  Administered 2014-11-04: 5 mg via ORAL
  Administered 2014-11-04: 10 mg via ORAL
  Administered 2014-11-05 – 2014-11-06 (×3): 5 mg via ORAL
  Filled 2014-11-02: qty 2
  Filled 2014-11-02 (×2): qty 1
  Filled 2014-11-02: qty 2
  Filled 2014-11-02 (×2): qty 1
  Filled 2014-11-02 (×2): qty 2
  Filled 2014-11-02: qty 1

## 2014-11-02 MED ORDER — POLYETHYLENE GLYCOL 3350 17 G PO PACK: 17.0000 g | PACK | Freq: Every day | ORAL | Status: DC | PRN

## 2014-11-02 MED ORDER — HYDROMORPHONE HCL 1 MG/ML IJ SOLN: 1.0000 mg | INTRAMUSCULAR | Status: DC | PRN

## 2014-11-02 MED ORDER — OXYCODONE HCL 5 MG/5ML PO SOLN: 5.0000 mg | Freq: Once | ORAL | Status: DC | PRN

## 2014-11-02 MED ORDER — GLYCOPYRROLATE 0.2 MG/ML IJ SOLN
INTRAMUSCULAR | Status: AC
  Filled 2014-11-02: qty 1

## 2014-11-02 MED ORDER — VITAMIN D 1000 UNITS PO TABS
1000.0000 [IU] | ORAL_TABLET | Freq: Two times a day (BID) | ORAL | Status: DC
  Administered 2014-11-02 – 2014-11-06 (×8): 1000 [IU] via ORAL
  Filled 2014-11-02 (×8): qty 1

## 2014-11-02 MED ORDER — METHOCARBAMOL 500 MG PO TABS
ORAL_TABLET | ORAL | Status: AC
  Filled 2014-11-02: qty 1

## 2014-11-02 MED ORDER — INFLUENZA VAC SPLIT QUAD 0.5 ML IM SUSY: 0.5000 mL | PREFILLED_SYRINGE | INTRAMUSCULAR | Status: DC

## 2014-11-02 MED ORDER — DOCUSATE SODIUM 100 MG PO CAPS
100.0000 mg | ORAL_CAPSULE | Freq: Two times a day (BID) | ORAL | Status: DC
  Administered 2014-11-02 – 2014-11-06 (×6): 100 mg via ORAL
  Filled 2014-11-02 (×9): qty 1

## 2014-11-02 MED ORDER — BISACODYL 10 MG RE SUPP: 10.0000 mg | Freq: Every day | RECTAL | Status: DC | PRN

## 2014-11-02 MED ORDER — ASPIRIN EC 325 MG PO TBEC
325.0000 mg | DELAYED_RELEASE_TABLET | Freq: Two times a day (BID) | ORAL | Status: DC
  Administered 2014-11-02 – 2014-11-06 (×8): 325 mg via ORAL
  Filled 2014-11-02 (×8): qty 1

## 2014-11-02 MED ORDER — ALUM & MAG HYDROXIDE-SIMETH 200-200-20 MG/5ML PO SUSP: 30.0000 mL | ORAL | Status: DC | PRN

## 2014-11-02 MED ORDER — PHENOL 1.4 % MT LIQD: 1.0000 | OROMUCOSAL | Status: DC | PRN

## 2014-11-02 MED ORDER — ONDANSETRON HCL 4 MG/2ML IJ SOLN
4.0000 mg | Freq: Four times a day (QID) | INTRAMUSCULAR | Status: DC | PRN
  Administered 2014-11-03: 4 mg via INTRAVENOUS
  Filled 2014-11-02: qty 2

## 2014-11-02 MED ORDER — MIDAZOLAM HCL 2 MG/2ML IJ SOLN
INTRAMUSCULAR | Status: AC
  Filled 2014-11-02: qty 4

## 2014-11-02 MED ORDER — MENTHOL 3 MG MT LOZG: 1.0000 | LOZENGE | OROMUCOSAL | Status: DC | PRN

## 2014-11-02 MED ORDER — METHOCARBAMOL 500 MG PO TABS
500.0000 mg | ORAL_TABLET | Freq: Four times a day (QID) | ORAL | Status: DC | PRN
  Administered 2014-11-02 – 2014-11-06 (×9): 500 mg via ORAL
  Filled 2014-11-02 (×9): qty 1

## 2014-11-02 MED ORDER — ONDANSETRON HCL 4 MG PO TABS: 4.0000 mg | ORAL_TABLET | Freq: Four times a day (QID) | ORAL | Status: DC | PRN

## 2014-11-02 MED ORDER — ACETAMINOPHEN 650 MG RE SUPP: 650.0000 mg | Freq: Four times a day (QID) | RECTAL | Status: DC | PRN

## 2014-11-02 MED ORDER — LIDOCAINE HCL (CARDIAC) 20 MG/ML IV SOLN
INTRAVENOUS | Status: DC | PRN
  Administered 2014-11-02: 60 mg via INTRAVENOUS

## 2014-11-02 MED ORDER — FUROSEMIDE 20 MG PO TABS: 30.0000 mg | ORAL_TABLET | Freq: Every day | ORAL | Status: DC | PRN

## 2014-11-02 MED ORDER — OXYCODONE HCL 5 MG PO TABS
5.0000 mg | ORAL_TABLET | Freq: Once | ORAL | Status: DC | PRN
Start: 2014-11-02 — End: 2014-11-02

## 2014-11-02 MED ORDER — KETOROLAC TROMETHAMINE 15 MG/ML IJ SOLN
7.5000 mg | Freq: Four times a day (QID) | INTRAMUSCULAR | Status: AC
  Administered 2014-11-02 – 2014-11-03 (×4): 7.5 mg via INTRAVENOUS
  Filled 2014-11-02 (×4): qty 1

## 2014-11-02 MED ORDER — LIDOCAINE HCL (CARDIAC) 20 MG/ML IV SOLN
INTRAVENOUS | Status: AC
  Filled 2014-11-02: qty 5

## 2014-11-02 MED ORDER — IPRATROPIUM-ALBUTEROL 0.5-2.5 (3) MG/3ML IN SOLN: 3.0000 mL | RESPIRATORY_TRACT | Status: DC | PRN

## 2014-11-02 MED ORDER — MIDAZOLAM HCL 5 MG/5ML IJ SOLN
INTRAMUSCULAR | Status: DC | PRN
  Administered 2014-11-02: 1 mg via INTRAVENOUS

## 2014-11-02 MED ORDER — LUTEIN 20 MG PO TABS: 20.0000 mg | ORAL_TABLET | Freq: Every day | ORAL | Status: DC

## 2014-11-02 MED ORDER — FENTANYL CITRATE (PF) 100 MCG/2ML IJ SOLN
INTRAMUSCULAR | Status: DC | PRN
  Administered 2014-11-02: 150 ug via INTRAVENOUS
  Administered 2014-11-02 (×2): 50 ug via INTRAVENOUS

## 2014-11-02 MED ORDER — ALBUTEROL SULFATE (2.5 MG/3ML) 0.083% IN NEBU: 2.5000 mg | INHALATION_SOLUTION | Freq: Four times a day (QID) | RESPIRATORY_TRACT | Status: DC | PRN

## 2014-11-02 MED ORDER — ROCURONIUM BROMIDE 100 MG/10ML IV SOLN
INTRAVENOUS | Status: DC | PRN
  Administered 2014-11-02: 50 mg via INTRAVENOUS
  Administered 2014-11-02: 10 mg via INTRAVENOUS

## 2014-11-02 MED ORDER — METOCLOPRAMIDE HCL 5 MG PO TABS: 5.0000 mg | ORAL_TABLET | Freq: Three times a day (TID) | ORAL | Status: DC | PRN

## 2014-11-02 MED ORDER — HYDROMORPHONE HCL 1 MG/ML IJ SOLN
INTRAMUSCULAR | Status: AC
Start: 2014-11-02 — End: 2014-11-03
  Filled 2014-11-02: qty 1

## 2014-11-02 MED ORDER — FENTANYL CITRATE (PF) 250 MCG/5ML IJ SOLN
INTRAMUSCULAR | Status: AC
  Filled 2014-11-02: qty 5

## 2014-11-02 MED ORDER — ACETAMINOPHEN 325 MG PO TABS
650.0000 mg | ORAL_TABLET | Freq: Four times a day (QID) | ORAL | Status: DC | PRN
  Administered 2014-11-03 – 2014-11-04 (×2): 650 mg via ORAL
  Filled 2014-11-02 (×2): qty 2

## 2014-11-02 SURGICAL SUPPLY — 54 items
APL SKNCLS STERI-STRIP NONHPOA (GAUZE/BANDAGES/DRESSINGS) ×1
BENZOIN TINCTURE PRP APPL 2/3 (GAUZE/BANDAGES/DRESSINGS) ×3 IMPLANT
BLADE SAW SGTL 18X1.27X75 (BLADE) ×2 IMPLANT
BLADE SAW SGTL 18X1.27X75MM (BLADE) ×1
BLADE SURG ROTATE 9660 (MISCELLANEOUS) IMPLANT
CAPT HIP TOTAL 2 ×3 IMPLANT
CELLS DAT CNTRL 66122 CELL SVR (MISCELLANEOUS) ×1 IMPLANT
CLOSURE WOUND 1/2 X4 (GAUZE/BANDAGES/DRESSINGS) ×2
COVER SURGICAL LIGHT HANDLE (MISCELLANEOUS) ×3 IMPLANT
DRAPE C-ARM 42X72 X-RAY (DRAPES) ×3 IMPLANT
DRAPE STERI IOBAN 125X83 (DRAPES) ×3 IMPLANT
DRAPE U-SHAPE 47X51 STRL (DRAPES) ×9 IMPLANT
DRSG AQUACEL AG ADV 3.5X10 (GAUZE/BANDAGES/DRESSINGS) ×3 IMPLANT
DURAPREP 26ML APPLICATOR (WOUND CARE) ×3 IMPLANT
ELECT BLADE 4.0 EZ CLEAN MEGAD (MISCELLANEOUS) ×3
ELECT BLADE 6.5 EXT (BLADE) IMPLANT
ELECT REM PT RETURN 9FT ADLT (ELECTROSURGICAL) ×3
ELECTRODE BLDE 4.0 EZ CLN MEGD (MISCELLANEOUS) ×1 IMPLANT
ELECTRODE REM PT RTRN 9FT ADLT (ELECTROSURGICAL) ×1 IMPLANT
FACESHIELD WRAPAROUND (MASK) ×6 IMPLANT
FACESHIELD WRAPAROUND OR TEAM (MASK) ×2 IMPLANT
GLOVE BIOGEL PI IND STRL 8 (GLOVE) ×2 IMPLANT
GLOVE BIOGEL PI INDICATOR 8 (GLOVE) ×4
GLOVE ECLIPSE 8.0 STRL XLNG CF (GLOVE) ×3 IMPLANT
GLOVE ORTHO TXT STRL SZ7.5 (GLOVE) ×6 IMPLANT
GOWN STRL REUS W/ TWL LRG LVL3 (GOWN DISPOSABLE) ×2 IMPLANT
GOWN STRL REUS W/ TWL XL LVL3 (GOWN DISPOSABLE) ×2 IMPLANT
GOWN STRL REUS W/TWL LRG LVL3 (GOWN DISPOSABLE) ×6
GOWN STRL REUS W/TWL XL LVL3 (GOWN DISPOSABLE) ×6
HANDPIECE INTERPULSE COAX TIP (DISPOSABLE) ×3
KIT BASIN OR (CUSTOM PROCEDURE TRAY) ×3 IMPLANT
KIT ROOM TURNOVER OR (KITS) ×3 IMPLANT
MANIFOLD NEPTUNE II (INSTRUMENTS) ×3 IMPLANT
NS IRRIG 1000ML POUR BTL (IV SOLUTION) ×3 IMPLANT
PACK TOTAL JOINT (CUSTOM PROCEDURE TRAY) ×3 IMPLANT
PACK UNIVERSAL I (CUSTOM PROCEDURE TRAY) ×3 IMPLANT
PAD ARMBOARD 7.5X6 YLW CONV (MISCELLANEOUS) ×3 IMPLANT
RETRACTOR WND ALEXIS 18 MED (MISCELLANEOUS) ×1 IMPLANT
RTRCTR WOUND ALEXIS 18CM MED (MISCELLANEOUS) ×3
SET HNDPC FAN SPRY TIP SCT (DISPOSABLE) ×1 IMPLANT
STAPLER VISISTAT 35W (STAPLE) IMPLANT
STRIP CLOSURE SKIN 1/2X4 (GAUZE/BANDAGES/DRESSINGS) ×4 IMPLANT
SUT ETHIBOND NAB CT1 #1 30IN (SUTURE) ×3 IMPLANT
SUT MNCRL AB 4-0 PS2 18 (SUTURE) IMPLANT
SUT VIC AB 0 CT1 27 (SUTURE) ×3
SUT VIC AB 0 CT1 27XBRD ANBCTR (SUTURE) ×1 IMPLANT
SUT VIC AB 1 CT1 27 (SUTURE) ×3
SUT VIC AB 1 CT1 27XBRD ANBCTR (SUTURE) ×1 IMPLANT
SUT VIC AB 2-0 CT1 27 (SUTURE) ×3
SUT VIC AB 2-0 CT1 TAPERPNT 27 (SUTURE) ×1 IMPLANT
TOWEL OR 17X24 6PK STRL BLUE (TOWEL DISPOSABLE) ×3 IMPLANT
TOWEL OR 17X26 10 PK STRL BLUE (TOWEL DISPOSABLE) ×3 IMPLANT
TRAY FOLEY CATH 16FRSI W/METER (SET/KITS/TRAYS/PACK) IMPLANT
WATER STERILE IRR 1000ML POUR (IV SOLUTION) ×6 IMPLANT

## 2014-11-02 NOTE — H&P (Signed)
TOTAL HIP ADMISSION H&P  Patient is admitted for left total hip arthroplasty.  Subjective:  Chief Complaint: left hip pain  HPI: [name], 64 y.o. female, has a history of pain and functional disability in the left hip(s) due to arthritis and patient has failed non-surgical conservative treatments for greater than 12 weeks to include NSAID's and/or analgesics, corticosteriod injections, flexibility and strengthening excercises, use of assistive devices, weight reduction as appropriate and activity modification.  Onset of symptoms was gradual starting 2 years ago with gradually worsening course since that time.The patient noted no past surgery on the left hip(s).  Patient currently rates pain in the left hip at 10 out of 10 with activity. Patient has night pain, worsening of pain with activity and weight bearing, trendelenberg gait, pain that interfers with activities of daily living, pain with passive range of motion and crepitus. Patient has evidence of subchondral cysts, subchondral sclerosis, periarticular osteophytes and joint space narrowing by imaging studies. This condition presents safety issues increasing the risk of falls.  There is no current active infection.  Patient Active Problem List   Diagnosis Date Noted  . Osteoarthritis of left hip 11/02/2014  . Osteoarthritis of right hip 09/08/2014  . Status post total replacement of right hip 09/08/2014  . PMB (postmenopausal bleeding) 02/28/2013  . Morbid obesity ([place]) 02/28/2013  . Pure hypercholesterolemia 02/28/2013  . Essential hypertension, benign 02/28/2013   Past Medical History  Diagnosis Date  . Obesity   . Hypercholesteremia   . Hypertension since 1990's  . Abnormal Pap smear of cervix 03/2009    Colpo Biopsy CIN I with HPV  . Scoliosis   . Asthma   . Bronchitis   . Bilateral edema of lower extremity     Takes lasix if needed  . Arthritis   . Teeth grinding   . Complication of anesthesia     ,pt a plate,4  screws in neck.also has buldging disc    Past Surgical History  Procedure Laterality Date  . Tonsillectomy and adenoidectomy    . Knee arthroscopy Right 1990  . Total knee arthroplasty Right 2009  . Shoulder arthroscopy Left 1994    arthritis, hips,fingers  . Appendectomy  1965  . Lumbar disc surgery  2006  . Cervical disc surgery  2007  . Back surgery  1993    Thoracic and Lumbar  . Hernia repair  1977, as child    bilateral inguinal -1 during pregnancy.  Umbilical repair as child  . Colonoscopy w/ polypectomy    . Breast biopsy Right 1990    benign cyst  . Total hip arthroplasty Right 09/08/2014    Procedure: RIGHT TOTAL HIP ARTHROPLASTY ANTERIOR APPROACH;  Surgeon: Mcarthur Rossetti, MD;  Location: [place];  Service: Orthopedics;  Laterality: Right;  . Joint replacement      No prescriptions prior to admission   Allergies  Allergen Reactions  . Lortab [Hydrocodone-Acetaminophen] Nausea Only and Rash    Social History  Substance Use Topics  . Smoking status: Never Smoker   . Smokeless tobacco: Never Used  . Alcohol Use: Yes     Comment: occasional    Family History  Problem Relation Age of Onset  . Breast cancer Sister 43    bilateral mastectomy- Breast cancer   . Hypertension Mother   . Heart disease Mother   . Stroke Mother   . Heart disease Father   . Depression Father   . Alcohol abuse Father   . Stroke  Maternal Grandmother   . Depression Daughter   . Emphysema Paternal Grandmother   . Breast cancer Maternal Aunt     Both aunts in 84 -37's  . Heart failure Paternal Grandfather   . Alcohol abuse Paternal Grandfather      Review of Systems  Musculoskeletal: Positive for back pain and joint pain.  All other systems reviewed and are negative.   Objective:  Physical Exam  Constitutional: She is oriented to person, place, and time. She appears well-developed and well-nourished.  HENT:  Head: Normocephalic and atraumatic.  Neck: Normal range of  motion. Neck supple.  Cardiovascular: Normal rate and regular rhythm.   Respiratory: Effort normal and breath sounds normal.  GI: Soft. Bowel sounds are normal.  Musculoskeletal:       Left hip: She exhibits decreased range of motion, decreased strength, tenderness and bony tenderness.  Neurological: She is alert and oriented to person, place, and time.  Skin: Skin is warm and dry.  Psychiatric: She has a normal mood and affect.    Vital signs in last 24 hours:    Labs:   Estimated body mass index is 44.38 kg/(m^2) as calculated from the following:   Height as of 09/17/14: 5\' 2"  (1.575 m).   Weight as of 09/17/14: 110.099 kg (242 lb 11.6 oz).   Imaging Review Plain radiographs demonstrate severe degenerative joint disease of the left hip(s). The bone quality appears to be good for age and reported activity level.  Assessment/Plan:  End stage arthritis, left hip(s)  The patient history, physical examination, clinical judgement of the provider and imaging studies are consistent with end stage degenerative joint disease of the left hip(s) and total hip arthroplasty is deemed medically necessary. The treatment options including medical management, injection therapy, arthroscopy and arthroplasty were discussed at length. The risks and benefits of total hip arthroplasty were presented and reviewed. The risks due to aseptic loosening, infection, stiffness, dislocation/subluxation,  thromboembolic complications and other imponderables were discussed.  The patient acknowledged the explanation, agreed to proceed with the plan and consent was signed. Patient is being admitted for inpatient treatment for surgery, pain control, PT, OT, prophylactic antibiotics, VTE prophylaxis, progressive ambulation and ADL's and discharge planning.The patient is planning to be discharged possibly to a skilled nursing facility

## 2014-11-02 NOTE — Progress Notes (Signed)
PHARMACIST - PHYSICIAN ORDER COMMUNICATION  CONCERNING: P&T Medication Policy on Herbal Medications  DESCRIPTION:  This patient's order for:  Co-Q 10, Lecithin, Lutein  has been noted.  This product(s) is classified as an "herbal" or natural product. Due to a lack of definitive safety studies or FDA approval, nonstandard manufacturing practices, plus the potential risk of unknown drug-drug interactions while on inpatient medications, the Pharmacy and Therapeutics Committee does not permit the use of "herbal" or natural products of this type within [facility].   ACTION TAKEN: The pharmacy department is unable to verify this order at this time and your patient has been informed of this safety policy. Please reevaluate patient's clinical condition at discharge and address if the herbal or natural product(s) should be resumed at that time.   Gracy Bruins, PharmD Clinical Pharmacist [facility]

## 2014-11-02 NOTE — Anesthesia Procedure Notes (Signed)
Procedure Name: Intubation Date/Time: 11/02/2014 3:09 PM Performed by: Layla Maw Pre-anesthesia Checklist: Patient identified, Timeout performed, Emergency Drugs available, Suction available and Patient being monitored Patient Re-evaluated:Patient Re-evaluated prior to inductionOxygen Delivery Method: Circle system utilized Preoxygenation: Pre-oxygenation with 100% oxygen Intubation Type: IV induction Ventilation: Mask ventilation without difficulty Laryngoscope Size: Miller and 2 Grade View: Grade II Tube type: Oral Tube size: 7.0 mm Number of attempts: 1 Airway Equipment and Method: Stylet Placement Confirmation: ETT inserted through vocal cords under direct vision,  breath sounds checked- equal and bilateral and positive ETCO2 Secured at: 22 cm Tube secured with: Tape Dental Injury: Teeth and Oropharynx as per pre-operative assessment

## 2014-11-02 NOTE — Brief Op Note (Signed)
11/02/2014  4:38 PM  PATIENT:  [name]  64 y.o. female  PRE-OPERATIVE DIAGNOSIS:  Osteoarthritis left hip  POST-OPERATIVE DIAGNOSIS:  Osteoarthritis left hip  PROCEDURE:  Procedure(s): LEFT TOTAL HIP ARTHROPLASTY ANTERIOR APPROACH (Left)  SURGEON:  Surgeon(s) and Role:    * Mcarthur Rossetti, MD - Primary  PHYSICIAN ASSISTANT: Benita Stabile, PA-C  ANESTHESIA:   general  EBL:  Total I/O In: -  Out: 470 [Urine:170; Blood:300]  BLOOD ADMINISTERED:none  DRAINS: none   LOCAL MEDICATIONS USED:  NONE  SPECIMEN:  No Specimen  DISPOSITION OF SPECIMEN:  N/A  COUNTS:  YES  TOURNIQUET:  * No tourniquets in log *  DICTATION: .Other Dictation: Dictation Number [id number]  PLAN OF CARE: Admit to inpatient   PATIENT DISPOSITION:  PACU - hemodynamically stable.   Delay start of Pharmacological VTE agent (>24hrs) due to surgical blood loss or risk of bleeding: no

## 2014-11-02 NOTE — Anesthesia Postprocedure Evaluation (Addendum)
Anesthesia Post Note  Patient: [name]  Procedure(s) Performed: Procedure(s) (LRB): LEFT TOTAL HIP ARTHROPLASTY ANTERIOR APPROACH (Left)  Anesthesia type: spinal  Patient location: PACU  Post pain: Pain level controlled  Post assessment: Patient's Cardiovascular Status Stable  Last Vitals:  Filed Vitals:   11/02/14 1712  BP: 110/87  Pulse: 62  Temp:   Resp: 13    Post vital signs: Reviewed and stable  Level of consciousness: awake  Complications: No apparent anesthesia complications

## 2014-11-02 NOTE — Anesthesia Preprocedure Evaluation (Addendum)
Anesthesia Evaluation  Patient identified by MRN, date of birth, ID band Patient awake    Reviewed: Allergy & Precautions, NPO status , Patient's Chart, lab work & pertinent test results  History of Anesthesia Complications Negative for: history of anesthetic complications  Airway Mallampati: I  TM Distance: >3 FB Neck ROM: Full    Dental  (+) Poor Dentition   Pulmonary asthma , Recent URI , Residual Cough,  Drainage from nose, denies cough, sob, most recent temp 99.5   breath sounds clear to auscultation       Cardiovascular Exercise Tolerance: Poor hypertension, Pt. on medications + Orthopnea   Rhythm:Regular Rate:Normal  LE edema, takes lasix PRN Last dose (30mg ) 11/01/14   Neuro/Psych PSYCHIATRIC DISORDERS Anxiety  Neuromuscular disease    GI/Hepatic Neg liver ROS, GERD  Controlled,  Endo/Other  Morbid obesity  Renal/GU negative Renal ROS     Musculoskeletal  (+) Arthritis ,   Abdominal (+) + obese,  Abdomen: soft. Bowel sounds: normal.  Peds  Hematology   Anesthesia Other Findings   Reproductive/Obstetrics                           Anesthesia Physical Anesthesia Plan  ASA: II  Anesthesia Plan: General   Post-op Pain Management:    Induction: Intravenous  Airway Management Planned: Oral ETT  Additional Equipment: None  Intra-op Plan:   Post-operative Plan: Extubation in OR  Informed Consent: I have reviewed the patients History and Physical, chart, labs and discussed the procedure including the risks, benefits and alternatives for the proposed anesthesia with the patient or authorized representative who has indicated his/her understanding and acceptance.   Dental advisory given  Plan Discussed with: CRNA and Surgeon  Anesthesia Plan Comments:         Anesthesia Quick Evaluation

## 2014-11-02 NOTE — Transfer of Care (Signed)
Immediate Anesthesia Transfer of Care Note  Patient: [name]  Procedure(s) Performed: Procedure(s): LEFT TOTAL HIP ARTHROPLASTY ANTERIOR APPROACH (Left)  Patient Location: PACU  Anesthesia Type:General  Level of Consciousness: awake, alert , oriented and patient cooperative  Airway & Oxygen Therapy: Patient Spontanous Breathing and Patient connected to face mask oxygen  Post-op Assessment: Report given to RN, Post -op Vital signs reviewed and stable and Patient moving all extremities X 4  Post vital signs: Reviewed and stable  Last Vitals:  Filed Vitals:   11/02/14 1654  BP: 137/51  Pulse: 69  Temp: 36.4 C  Resp: 13    Complications: No apparent anesthesia complications

## 2014-11-03 ENCOUNTER — Encounter (HOSPITAL_COMMUNITY): Payer: Self-pay | Admitting: General Practice

## 2014-11-03 LAB — CBC
HCT: 31.4 % — ABNORMAL LOW (ref 36.0–46.0)
HEMOGLOBIN: 10 g/dL — AB (ref 12.0–15.0)
MCH: 28.4 pg (ref 26.0–34.0)
MCHC: 31.8 g/dL (ref 30.0–36.0)
MCV: 89.2 fL (ref 78.0–100.0)
Platelets: 171 10*3/uL (ref 150–400)
RBC: 3.52 MIL/uL — AB (ref 3.87–5.11)
RDW: 13.4 % (ref 11.5–15.5)
WBC: 7.9 10*3/uL (ref 4.0–10.5)

## 2014-11-03 LAB — BASIC METABOLIC PANEL
ANION GAP: 7 (ref 5–15)
BUN: 11 mg/dL (ref 6–20)
CALCIUM: 8.2 mg/dL — AB (ref 8.9–10.3)
CO2: 27 mmol/L (ref 22–32)
CREATININE: 0.58 mg/dL (ref 0.44–1.00)
Chloride: 104 mmol/L (ref 101–111)
GFR calc non Af Amer: >60 mL/min (ref >60)
GLUCOSE: 99 mg/dL (ref 65–99)
Potassium: 3.9 mmol/L (ref 3.5–5.1)
SODIUM: 138 mmol/L (ref 135–145)

## 2014-11-03 MED ORDER — INFLUENZA VAC SPLIT QUAD 0.5 ML IM SUSY: 0.5000 mL | PREFILLED_SYRINGE | INTRAMUSCULAR | Status: AC | PRN

## 2014-11-03 NOTE — Evaluation (Signed)
Physical Therapy Evaluation Patient Details Name: [name] MRN: [id number] DOB: [birth year] Today's Date: 11/03/2014   History of Present Illness  Patient is a 64 y/o female s/p R THA. PMH includes obesity, HTN, hypercholesteremia.  Clinical Impression  Pt is s/p THA resulting in the deficits listed below (see PT Problem List). Pt will benefit from skilled PT to increase their independence and safety with mobility to allow discharge to the venue listed below. Patient reporting that she is planning to return home alone at D/C. She states that her daughter lives close and could assist as needed. Will attempt to progress mobility and independence for anticipated D/C home.      Follow Up Recommendations Home health PT;Supervision - Intermittent    Equipment Recommendations  None recommended by PT;Other (comment) (patient reports having rw at home. )    Recommendations for Other Services       Precautions / Restrictions Precautions Precautions: Fall Precaution Comments: direct anterior hip-no precautions  Restrictions Weight Bearing Restrictions: Yes LLE Weight Bearing: Weight bearing as tolerated      Mobility  Bed Mobility               General bed mobility comments: up in recliner prior to session  Transfers Overall transfer level: Needs assistance Equipment used: Rolling walker (2 wheeled) Transfers: Sit to/from Stand Sit to Stand: Min guard         General transfer comment: consistent with hand placement. Patient transferring from chair and toilet during session.   Ambulation/Gait Ambulation/Gait assistance: Min guard Ambulation Distance (Feet): 50 Feet (40 feet X1, 5 feet X2. ) Assistive device: Rolling walker (2 wheeled) Gait Pattern/deviations: Step-to pattern;Trendelenburg;Trunk flexed;Decreased weight shift to left;Decreased stance time - left Gait velocity: decreased   General Gait Details: cues for posture during session.   Stairs             Wheelchair Mobility    Modified Rankin (Stroke Patients Only)       Balance Overall balance assessment: Needs assistance Sitting-balance support: No upper extremity supported Sitting balance-Leahy Scale: Good     Standing balance support: Bilateral upper extremity supported Standing balance-Leahy Scale: Poor Standing balance comment: using rw                             Pertinent Vitals/Pain Pain Assessment: 0-10 Pain Score: 6  Pain Location: Lt hip Pain Descriptors / Indicators: Sore Pain Intervention(s): Limited activity within patient's tolerance;Monitored during session;RN gave pain meds during session    Home Living Family/patient expects to be discharged to:: Private residence Living Arrangements: Alone Available Help at Discharge: Family;Available PRN/intermittently;Other (Comment) (daughter lives close by per patient report) Type of Home: House Home Access: Stairs to enter Entrance Stairs-Rails: Psychiatric nurse of Steps: 7 Home Layout: Multi-level;Able to live on main level with bedroom/bathroom Home Equipment: [place] Rile - 2 wheels;Cane - single point      Prior Function Level of Independence: Independent               Hand Dominance       Extremity/Trunk Assessment   Upper Extremity Assessment: Defer to OT evaluation           Lower Extremity Assessment: LLE deficits/detail   LLE Deficits / Details: fair quad activation    Communication   Communication: No difficulties  Cognition Arousal/Alertness: Awake/alert Behavior During Therapy: WFL for tasks assessed/performed Overall Cognitive Status: Within Functional Limits for tasks assessed  General Comments      Exercises        Assessment/Plan    PT Assessment Patient needs continued PT services  PT Diagnosis Difficulty walking   PT Problem List Decreased strength;Decreased range of motion;Decreased activity  tolerance;Decreased balance;Decreased mobility;Pain  PT Treatment Interventions DME instruction;Gait training;Stair training;Functional mobility training;Therapeutic activities;Therapeutic exercise;Balance training;Patient/family education   PT Goals (Current goals can be found in the Care Plan section) Acute Rehab PT Goals Patient Stated Goal: to go home PT Goal Formulation: With patient Time For Goal Achievement: 11/17/14 Potential to Achieve Goals: Good    Frequency 7X/week   Barriers to discharge        Co-evaluation               End of Session Equipment Utilized During Treatment: Gait belt Activity Tolerance: Patient tolerated treatment well Patient left: in chair;with call bell/phone within reach;with family/visitor present Nurse Communication: Mobility status;Weight bearing status         Time: 9449-6759 PT Time Calculation (min) (ACUTE ONLY): 25 min   Charges:   PT Evaluation $Initial PT Evaluation Tier I: 1 Procedure PT Treatments $Gait Training: 8-22 mins   PT G Codes:        Cassell Clement, PT, CSCS Pager [phone] Office [phone] 11/03/2014, 12:53 PM

## 2014-11-03 NOTE — Op Note (Signed)
[name], [name]                ACCOUNT NO.:  [id number]  MEDICAL RECORD NO.:  [id number]  LOCATION:  [id number]                        FACILITY:  [facility]  PHYSICIAN:  Lind Guest. Ninfa Linden, M.D.DATE OF BIRTH:  02/17/1950  DATE OF PROCEDURE:  11/02/2014 DATE OF DISCHARGE:                              OPERATIVE REPORT   PREOPERATIVE DIAGNOSIS:  Primary osteoarthritis, degenerative joint disease, left hip.  POSTOPERATIVE DIAGNOSIS:  Primary osteoarthritis, degenerative joint disease, left hip.  PROCEDURE:  Left total hip arthroplasty through direct anterior approach.  IMPLANTS:  DePuy Sector Gription acetabular component size 50, size 32 +4 neutral polyethylene liner, size 13 Corail femoral component with standard offset, size 32 +5 ceramic hip ball.  SURGEON:  Lind Guest. Ninfa Linden, M.D.  ASSISTANT SURGEON:  Erskine Emery, P.A.  ANESTHESIA:  General.  ANTIBIOTICS:  3 g IV Ancef.  ESTIMATED BLOOD LOSS:  200 to 300 mL.  COMPLICATIONS:  None.  INDICATIONS:  Ms. Rummell is a very pleasant 64 year old female, well known to me.  She has debilitating arthritis involving her both hips. She is someone who is significantly obese.  In September, she underwent a successful right total hip arthroplasty through direct anterior approach.  She has done so well from that hip and recovered so quickly. She wished to have her left hip surgery at this point.  She understands the risks of acute blood loss anemia, nerve and vessel injury, fracture, infection, dislocation, DVTs.  She understands our goals are decreased pain, improved mobility, and overall improved quality of life.  Of note, her x-ray showed complete loss of joint space on her left hip, periarticular osteophytes, sclerotic and cystic changes.  She has tried and failed all forms of conservative treatment.  DESCRIPTION OF PROCEDURE:  After informed consent was obtained, appropriate left hip was marked.  She was brought to the  operating room and general anesthesia obtained while she was on her stretcher.  A Foley catheter was placed and both feet had traction boots applied to them. Next, she was placed in supine on the Hana fracture table with the perineal post in place and both legs in inline skeletal traction devices, but no traction applied.  Her left operative hip was then prepped and draped with DuraPrep and sterile drapes.  Time-out was called to identify correct patient, correct left hip.  We then made an incision inferior and posterior to the anterior and superior iliac spine and carried this obliquely down the leg.  We dissected down the tensor fascia lata muscle and tensor fascia.  We then divided longitudinally, so we could proceed with a direct anterior approach to the hip.  We identified and cauterized lateral femoral circumflex vessels and identified the hip capsule.  We placed a Cobra retractor on the medial and lateral femoral neck and then opened up the hip capsule in an L type format, finding large joint effusion and placed the Cobra retractors within the hip capsule.  We then made our femoral neck cut with an oscillating saw just proximal to the lesser trochanter and completed this with an osteotome.  I placed a corkscrew guide in the femoral head and removed the femoral head in its entirety and found  it to be devoid of cartilage.  We then passed it off the back table and cleaned the acetabular remnants, acetabular labrum, and other debris.  We placed a bent Hohmann over the medial acetabular rim and then began reaming under direct visualization from a size 42 reamer up to a size 50, with all reamers under direct visualization, and the last reamer also under direct fluoroscopy, so we could obtain our depth of reaming, our inclination, and anteversion.  Once we were pleased with this, we placed the real DePuy Sector Gription acetabular component size 50 and a 32 +4 neutral polyethylene  liner  for that size acetabular component. Attention was then turned to the femur.  With the leg externally rotated to 100 degrees, extended and adducted, we were able to place a Mueller retractor medially and Hohmann retractor behind the greater trochanter, released the lateral joint capsule and the piriformis and then began broaching from a size 8 broach up to a size 13 and this actually correlated with other side.  We used a calcar planer off this and then trialed a 32 +1 hip ball and rolled the leg back over and up with traction and internal rotation, reducing the pelvis and was stable.  We felt like we could increase her leg length and offset by going with 1 higher hip ball.  We then dislocated the hip and removed the trial components.  We placed the real size 13 Corail femoral component with standard offset, and then the real 32 +5 ceramic hip ball.  We reduced this in the acetabulum.  Again, we were pleased with stability, leg length, and offset.  We then removed all instrumentation.  We irrigated the hip with normal saline solution using pulsatile lavage.  We were able to close the joint capsule with interrupted #1 Ethibond suture followed by running #1 Vicryl in the tensor fascia, with 0 Vicryl in the deep tissue, 2-0 Vicryl in the subcutaneous tissue, and staples on the skin.  Xeroform and Aquacel dressing were applied.  She was taken off the Hana table, awakened, extubated, and taken to the recovery room in stable condition.  All final counts were correct.  There were no complications noted.  Of note, Erskine Emery, PA-C, assisted in the entire case, his assistance was crucial for facilitating all aspects of this case.     Lind Guest. Ninfa Linden, M.D.     CYB/MEDQ  D:  11/02/2014  T:  11/03/2014  Job:  [id number]

## 2014-11-03 NOTE — Care Management Note (Signed)
Case Management Note  Patient Details  Name: [name] MRN: [id number] Date of Birth: 10-16-1950  Subjective/Objective:   64 yr old female s/p left total hip arthroplasty, anterior approach.                 Action/Plan:  Case manager spoke patient at bedside concerning discharge planning needs and home health . Patient was preoperatively setup with [facility], no changes. Patient states she has rolling walker and 3in1. She will have assistance at discharge.  Expected Discharge Date:    11/04/14              Expected Discharge Plan:  [place]  In-House Referral:     Discharge planning Services  CM Consult  Post Acute Care Choice:  Home Health Choice offered to:     DME Arranged:  N/A DME Agency:     HH Arranged:  NA HH Agency:  [place] [facility]  Status of Service:  Completed, signed off  Medicare Important Message Given:    Date Medicare IM Given:    Medicare IM give by:    Date Additional Medicare IM Given:    Additional Medicare Important Message give by:     If discussed at [place] of Stay Meetings, dates discussed:    Additional Comments:  Ninfa Meeker, RN 11/03/2014, 1:36 PM

## 2014-11-03 NOTE — Progress Notes (Signed)
Subjective: 1 Day Post-Op Procedure(s) (LRB): LEFT TOTAL HIP ARTHROPLASTY ANTERIOR APPROACH (Left) Patient reports pain as moderate.  Acute blood loss anemia from surgery, but vitals stable and asymptomatic.  Morbidly obese with high BMI.  Objective: Vital signs in last 24 hours: Temp:  [97.6 F (36.4 C)-99.5 F (37.5 C)] 98.1 F (36.7 C) (11/01 0500) Pulse Rate:  [60-89] 75 (11/01 0645) Resp:  [0-20] 18 (11/01 0500) BP: (110-182)/(48-87) 123/48 mmHg (11/01 0645) SpO2:  [98 %-100 %] 99 % (11/01 0500) Weight:  [108.863 kg (240 lb)] 108.863 kg (240 lb) (10/31 1256)  Intake/Output from previous day: 10/31 0701 - 11/01 0700 In: 1150 [P.O.:100; I.V.:1050] Out: 710 [Urine:410; Blood:300] Intake/Output this shift:     Recent Labs  11/03/14 0528  HGB 10.0*    Recent Labs  11/03/14 0528  WBC 7.9  RBC 3.52*  HCT 31.4*  PLT 171    Recent Labs  11/03/14 0528  NA 138  K 3.9  CL 104  CO2 27  BUN 11  CREATININE 0.58  GLUCOSE 99  CALCIUM 8.2*   No results for input(s): LABPT, INR in the last 72 hours.  Sensation intact distally Intact pulses distally Dorsiflexion/Plantar flexion intact Incision: scant drainage  Assessment/Plan: 1 Day Post-Op Procedure(s) (LRB): LEFT TOTAL HIP ARTHROPLASTY ANTERIOR APPROACH (Left) Up with therapy Discharge home with home health in next 2 days.  Arriyah Madej Y 11/03/2014, 7:36 AM

## 2014-11-03 NOTE — Evaluation (Signed)
Occupational Therapy Evaluation Patient Details Name: [name] MRN: [id number] DOB: July 18, 1950 Today's Date: 11/03/2014    History of Present Illness Patient is a 64 y/o female s/p R THA. PMH includes obesity, HTN, hypercholesteremia.   Clinical Impression   Pt reports she was independent with ADLs PTA. Currently pt is overall min guard for ADLs and functional mobility. All education completed; pt with no further questions or concerns for OT at this time. Pt reports that she had her other hip done last year and recalls techniques and DME use from last time. Pt plan to d/c home with intermittent supervision from her daughter who lives down the street. No acute OT needs identified at this time; signing off for OT. Thank you for this referral.     Follow Up Recommendations  No OT follow up;Supervision - Intermittent    Equipment Recommendations  None recommended by OT    Recommendations for Other Services       Precautions / Restrictions Precautions Precautions: Fall Precaution Comments: direct anterior hip-no precautions  Restrictions Weight Bearing Restrictions: Yes LLE Weight Bearing: Weight bearing as tolerated      Mobility Bed Mobility               General bed mobility comments: Pt in recliner, returned to recliner at end of session  Transfers Overall transfer level: Needs assistance Equipment used: Rolling walker (2 wheeled) Transfers: Sit to/from Stand Sit to Stand: Min guard         General transfer comment: Min guard for safety. Good hand placement and technique. Sit <> stand from chair x 1    Balance Overall balance assessment: Needs assistance Sitting-balance support: No upper extremity supported;Feet supported Sitting balance-Leahy Scale: Good     Standing balance support: Bilateral upper extremity supported Standing balance-Leahy Scale: Poor Standing balance comment: RW for support                            ADL Overall  ADL's : Needs assistance/impaired Eating/Feeding: Set up;Sitting   Grooming: Min guard;Standing       Lower Body Bathing: Min guard;Sit to/from stand       Lower Body Dressing: Min guard;Sit to/from stand   Toilet Transfer: Min guard;Ambulation;BSC;RW       Tub/ Shower Transfer: Tub transfer;Min guard;Ambulation;Tub bench;Rolling walker   Functional mobility during ADLs: Min guard;Rolling walker General ADL Comments: Daugher and close friend present during OT eval. Educated pt on compensatory strategies for LB ADLs, use of 3 in 1 over toilet, use of tub bench for tub transfers, edema management techniques; pt verbalized understanding. Pt reports that she had previous hip surgery last year; is familiar with equipment and techniques.      Vision     Perception     Praxis      Pertinent Vitals/Pain Pain Assessment: 0-10 Pain Score: 5  Pain Location: L hip Pain Descriptors / Indicators: Burning Pain Intervention(s): Limited activity within patient's tolerance;Monitored during session;Repositioned;Ice applied     Hand Dominance Right   Extremity/Trunk Assessment Upper Extremity Assessment Upper Extremity Assessment: Overall WFL for tasks assessed   Lower Extremity Assessment Lower Extremity Assessment: Defer to PT evaluation   Cervical / Trunk Assessment Cervical / Trunk Assessment: Normal   Communication Communication Communication: No difficulties   Cognition Arousal/Alertness: Awake/alert Behavior During Therapy: WFL for tasks assessed/performed Overall Cognitive Status: Within Functional Limits for tasks assessed  General Comments       Exercises       Shoulder Instructions      Home Living Family/patient expects to be discharged to:: Private residence Living Arrangements: Alone Available Help at Discharge: Family;Available PRN/intermittently (daughter lives down the street ) Type of Home: House Home Access: Stairs to  enter [employer] of Steps: 7 Entrance Stairs-Rails: Right Home Layout: Multi-level;Able to live on main level with bedroom/bathroom     Bathroom Shower/Tub: Tub/shower unit;Curtain Shower/tub characteristics: Architectural technologist: Standard Bathroom Accessibility: Yes How Accessible: Accessible via walker Home Equipment: [place] - 2 wheels;Bedside commode;Cane - single point;Walker - 4 wheels;Other (comment);Tub bench          Prior Functioning/Environment Level of Independence: Independent             OT Diagnosis: Acute pain   OT Problem List:     OT Treatment/Interventions:      OT Goals(Current goals can be found in the care plan section) Acute Rehab OT Goals Patient Stated Goal: to go home OT Goal Formulation: With patient  OT Frequency:     Barriers to D/C:            Co-evaluation              End of Session Equipment Utilized During Treatment: Gait belt;Rolling walker  Activity Tolerance: Patient tolerated treatment well Patient left: in chair;with call bell/phone within reach;with family/visitor present   Time: 3567-0141 OT Time Calculation (min): 14 min Charges:  OT General Charges $OT Visit: 1 Procedure OT Evaluation $Initial OT Evaluation Tier I: 1 Procedure G-Codes:     Binnie Kand M.S., OTR/L Pager: [phone]  11/03/2014, 11:54 AM

## 2014-11-03 NOTE — Clinical Social Work Note (Signed)
CSW consult acknowledged:  Clinical Education officer, museum received consult for SNF placement. PT currently recommending Home Health.   Clinical Social Worker will sign off for now as social work intervention is no longer needed. Please consult [place] again if new need arises.  Glendon Axe, MSW, [place] [phone] 11/03/2014 3:07 PM

## 2014-11-03 NOTE — Care Management (Signed)
Utilization review completed. Kylle Lall, RN Case Manager [phone]. 

## 2014-11-04 LAB — CBC
HEMATOCRIT: 29.1 % — AB (ref 36.0–46.0)
HEMOGLOBIN: 9.5 g/dL — AB (ref 12.0–15.0)
MCH: 28.8 pg (ref 26.0–34.0)
MCHC: 32.6 g/dL (ref 30.0–36.0)
MCV: 88.2 fL (ref 78.0–100.0)
Platelets: 159 10*3/uL (ref 150–400)
RBC: 3.3 MIL/uL — ABNORMAL LOW (ref 3.87–5.11)
RDW: 13.6 % (ref 11.5–15.5)
WBC: 8.7 10*3/uL (ref 4.0–10.5)

## 2014-11-04 NOTE — Progress Notes (Signed)
Physical Therapy Treatment Patient Details Name: [name] MRN: [id number] DOB: October 03, 1950 Today's Date: 11/04/2014    History of Present Illness Patient is a 64 y/o female s/p R THA. PMH includes obesity, HTN, hypercholesteremia.    PT Comments    Patient making gradual progress with mobility during PT session. Patient able to ambulate 100 feet X2 with rw. Will review stairs at next session. At this time the patient is reporting that she is planning to D/C to home from the hospital. Will continue to progress mobility.   Follow Up Recommendations  Home health PT;Supervision - Intermittent     Equipment Recommendations  None recommended by PT    Recommendations for Other Services       Precautions / Restrictions Precautions Precautions: Fall Precaution Comments: direct anterior hip-no precautions  Restrictions Weight Bearing Restrictions: Yes LLE Weight Bearing: Weight bearing as tolerated    Mobility  Bed Mobility Overal bed mobility: Needs Assistance Bed Mobility: Supine to Sit     Supine to sit: Supervision;HOB elevated        Transfers Overall transfer level: Needs assistance Equipment used: Rolling walker (2 wheeled) Transfers: Sit to/from Stand Sit to Stand: Supervision         General transfer comment: safe and stable technique demonstrated.   Ambulation/Gait Ambulation/Gait assistance: Supervision Ambulation Distance (Feet): 200 Feet (100 feet X2) Assistive device: Rolling walker (2 wheeled) Gait Pattern/deviations: Step-through pattern Gait velocity: decreased   General Gait Details: cues for posture and decreased pressure through rw.   Stairs            Wheelchair Mobility    Modified Rankin (Stroke Patients Only)       Balance Overall balance assessment: Needs assistance Sitting-balance support: No upper extremity supported Sitting balance-Leahy Scale: Good     Standing balance support: During functional  activity Standing balance-Leahy Scale: Fair Standing balance comment: using rw                    Cognition Arousal/Alertness: Awake/alert Behavior During Therapy: WFL for tasks assessed/performed Overall Cognitive Status: Within Functional Limits for tasks assessed                      Exercises      General Comments        Pertinent Vitals/Pain Pain Assessment: 0-10 Pain Score: 6  Pain Location: Lt hip/thigh Pain Descriptors / Indicators: Sharp Pain Intervention(s): Limited activity within patient's tolerance;Monitored during session    Home Living                      Prior Function            PT Goals (current goals can now be found in the care plan section) Acute Rehab PT Goals Patient Stated Goal: Doing better today, still wanting to go home tomorrow. PT Goal Formulation: With patient Time For Goal Achievement: 11/17/14 Potential to Achieve Goals: Good Progress towards PT goals: Progressing toward goals    Frequency  7X/week    PT Plan Current plan remains appropriate    Co-evaluation             End of Session Equipment Utilized During Treatment: Gait belt Activity Tolerance: Patient tolerated treatment well Patient left: in bed;with call bell/phone within reach;with family/visitor present;Other (comment) (declined chair at this time, will get up with nursing later)     Time: 1448-1856 PT Time Calculation (min) (ACUTE ONLY): 16 min  Charges:  $Gait Training: 8-22 mins                    G Codes:      Cassell Clement, PT, CSCS Pager [phone] Office [phone]  11/04/2014, 2:12 PM

## 2014-11-04 NOTE — Progress Notes (Signed)
Patient ID: [name], female   DOB: February 26, 1950, 64 y.o.   MRN: [id number] No acute changes overnight.  Working with therapy.  Would rather go home than SNF.  Labs not back yet this am.  Will continue therapy today with the goal of going home in the next 1-2 days.

## 2014-11-04 NOTE — Progress Notes (Signed)
Physical Therapy Treatment Patient Details Name: [name] MRN: [id number] DOB: 08/22/1950 Today's Date: 11/04/2014    History of Present Illness Patient is a 65 y/o female s/p R THA. PMH includes obesity, HTN, hypercholesteremia.    PT Comments    Patient able to increase her ambulation distance to 150 feet (75 feet X2) with rw and supervision. Will need to review stairs prior to going home. Reviewing HEP as well and handout provided. Patient reporting that she is planning to D/C home alone with intermittent supervision by family as needed. Will continue to follow and progress mobility and independence.   Follow Up Recommendations  Home health PT;Supervision - Intermittent     Equipment Recommendations  None recommended by PT    Recommendations for Other Services       Precautions / Restrictions Precautions Precautions: Fall Precaution Comments: direct anterior hip-no precautions  Restrictions Weight Bearing Restrictions: Yes LLE Weight Bearing: Weight bearing as tolerated    Mobility  Bed Mobility Overal bed mobility: Needs Assistance Bed Mobility: Supine to Sit     Supine to sit: Supervision;HOB elevated (approximately 30 degrees)     General bed mobility comments: discussed attempting with HOB flat. Patient reporting that she is planning to sleep in her recliner when she goes home.   Transfers Overall transfer level: Needs assistance Equipment used: Rolling walker (2 wheeled) Transfers: Sit to/from Stand Sit to Stand: Supervision         General transfer comment: safe and stable technique demonstrated.   Ambulation/Gait Ambulation/Gait assistance: Supervision Ambulation Distance (Feet): 150 Feet (75 feet X2) Assistive device: Rolling walker (2 wheeled) Gait Pattern/deviations: Step-through pattern;Decreased stance time - left;Decreased weight shift to left Gait velocity: decreased       Stairs            Wheelchair Mobility    Modified  Rankin (Stroke Patients Only)       Balance Overall balance assessment: Needs assistance Sitting-balance support: No upper extremity supported Sitting balance-Leahy Scale: Good     Standing balance support: During functional activity Standing balance-Leahy Scale: Fair Standing balance comment: using rw                    Cognition Arousal/Alertness: Awake/alert Behavior During Therapy: WFL for tasks assessed/performed Overall Cognitive Status: Within Functional Limits for tasks assessed                      Exercises Total Joint Exercises Ankle Circles/Pumps: AROM;Both;15 reps Quad Sets: Strengthening;Left;10 reps Short Arc Quad: Strengthening;Left;10 reps Heel Slides: AROM;Left;10 reps Hip ABduction/ADduction: Strengthening;Left;10 reps    General Comments        Pertinent Vitals/Pain Pain Assessment: 0-10 Pain Score: 6  Pain Location: Lt hip Pain Descriptors / Indicators: Sore Pain Intervention(s): Limited activity within patient's tolerance;Monitored during session    Home Living                      Prior Function            PT Goals (current goals can now be found in the care plan section) Acute Rehab PT Goals Patient Stated Goal: Doing better today, still wanting to go home tomorrow. PT Goal Formulation: With patient Time For Goal Achievement: 11/17/14 Potential to Achieve Goals: Good Progress towards PT goals: Progressing toward goals    Frequency  7X/week    PT Plan Current plan remains appropriate    Co-evaluation  End of Session Equipment Utilized During Treatment: Gait belt Activity Tolerance: Patient tolerated treatment well Patient left: in chair;with call bell/phone within reach;with family/visitor present     Time: 3568-6168 PT Time Calculation (min) (ACUTE ONLY): 24 min  Charges:  $Gait Training: 8-22 mins $Therapeutic Exercise: 8-22 mins                    G Codes:      Cassell Clement, PT, CSCS Pager [phone] Office [phone]  11/04/2014, 10:22 AM

## 2014-11-05 MED ORDER — OXYCODONE-ACETAMINOPHEN 5-325 MG PO TABS: 1.0000 | ORAL_TABLET | ORAL | Status: DC | PRN

## 2014-11-05 MED ORDER — INFLUENZA VAC SPLIT QUAD 0.5 ML IM SUSY
0.5000 mL | PREFILLED_SYRINGE | INTRAMUSCULAR | Status: AC
  Administered 2014-11-06: 0.5 mL via INTRAMUSCULAR
  Filled 2014-11-05: qty 0.5

## 2014-11-05 MED ORDER — ASPIRIN 325 MG PO TBEC: 325.0000 mg | DELAYED_RELEASE_TABLET | Freq: Two times a day (BID) | ORAL | Status: DC

## 2014-11-05 MED ORDER — DICLOFENAC SODIUM 75 MG PO TBEC: 75.0000 mg | DELAYED_RELEASE_TABLET | Freq: Two times a day (BID) | ORAL | Status: DC | PRN

## 2014-11-05 NOTE — Discharge Summary (Signed)
Patient ID: [name] MRN: [id number] DOB/AGE: 06-23-1950 64 y.o.  Admit date: 11/02/2014 Discharge date: 11/05/2014  Admission Diagnoses:  Principal Problem:   Osteoarthritis of left hip Active Problems:   Status post total replacement of left hip   Discharge Diagnoses:  Same  Past Medical History  Diagnosis Date  . Obesity   . Hypercholesteremia   . Hypertension since 1990's  . Abnormal Pap smear of cervix 03/2009    Colpo Biopsy CIN I with HPV  . Scoliosis   . Asthma   . Bronchitis   . Bilateral edema of lower extremity     Takes lasix if needed  . Arthritis   . Teeth grinding   . Lumbar herniated disc   . Spinal stenosis   . Disc disorder     bulging disc in thoracic area    Surgeries: Procedure(s): LEFT TOTAL HIP ARTHROPLASTY ANTERIOR APPROACH on 11/02/2014   Consultants:  PT/OT  Discharged Condition: Improved  Hospital Course: [name] is an 64 y.o. female who was admitted 11/02/2014 for operative treatment ofOsteoarthritis of left hip. Patient has severe unremitting pain that affects sleep, daily activities, and work/hobbies. After pre-op clearance the patient was taken to the operating room on 11/02/2014 and underwent  Procedure(s): LEFT TOTAL HIP ARTHROPLASTY ANTERIOR APPROACH.    Patient was given perioperative antibiotics: Anti-infectives    Start     Dose/Rate Route Frequency Ordered Stop   11/02/14 2000  ceFAZolin (ANCEF) IVPB 2 g/50 mL premix     2 g 100 mL/hr over 30 Minutes Intravenous Every 6 hours 11/02/14 1839 11/03/14 0322   11/02/14 1400  ceFAZolin (ANCEF) IVPB 2 g/50 mL premix     2 g 100 mL/hr over 30 Minutes Intravenous On call to O.R. 10/30/14 1323 11/02/14 1532       Patient was given sequential compression devices, early ambulation, and chemoprophylaxis to prevent DVT.  Patient benefited maximally from hospital stay and there were no complications.    Recent vital signs: Patient Vitals for the past 24 hrs:  BP Temp  Temp src Pulse Resp SpO2  11/05/14 0839 (!) 124/44 mmHg - - 87 - -  11/05/14 0535 (!) 123/44 mmHg 99.6 F (37.6 C) Oral 87 20 96 %  11/04/14 1957 (!) 131/54 mmHg 99.8 F (37.7 C) Oral 89 20 97 %  11/04/14 1236 (!) 107/48 mmHg 98.5 F (36.9 C) - 74 20 100 %     Recent laboratory studies:  Recent Labs  11/03/14 0528 11/04/14 0625  WBC 7.9 8.7  HGB 10.0* 9.5*  HCT 31.4* 29.1*  PLT 171 159  NA 138  --   K 3.9  --   CL 104  --   CO2 27  --   BUN 11  --   CREATININE 0.58  --   GLUCOSE 99  --   CALCIUM 8.2*  --      Discharge Medications:     Medication List    TAKE these medications        albuterol 108 (90 BASE) MCG/ACT inhaler  Commonly known as:  PROVENTIL HFA;VENTOLIN HFA  Inhale 2 puffs into the lungs every 6 (six) hours as needed for wheezing or shortness of breath.     aspirin 325 MG EC tablet  Take 1 tablet (325 mg total) by mouth 2 (two) times daily after a meal.     cholecalciferol 1000 UNITS tablet  Commonly known as:  VITAMIN D  Take 1,000 Units by mouth  2 (two) times daily.     Cinnamon 500 MG capsule  Take 500 mg by mouth 2 (two) times daily.     Co Q 10 100 MG Caps  Take 200 mg by mouth daily.     cyclobenzaprine 10 MG tablet  Commonly known as:  FLEXERIL  Take 10 mg by mouth 2 (two) times daily as needed for muscle spasms.     diclofenac 75 MG EC tablet  Commonly known as:  VOLTAREN  Take 1 tablet (75 mg total) by mouth 2 (two) times daily as needed for mild pain.     DUONEB 0.5-2.5 (3) MG/3ML Soln  Generic drug:  ipratropium-albuterol  Take 3 mLs by nebulization every 4 (four) hours as needed (shortness of breath).     felodipine 10 MG 24 hr tablet  Commonly known as:  PLENDIL  Take 10 mg by mouth daily.     FLAXSEED (LINSEED) PO  Take 1,300 mg by mouth daily.     Garlic 4193 MG Caps  Take 1,000 mg by mouth daily.     Grape Seed 50 MG Tabs  Take 50 mg by mouth daily.     irbesartan 300 MG tablet  Commonly known as:  AVAPRO   Take 300 mg by mouth daily.     labetalol 200 MG tablet  Commonly known as:  NORMODYNE  Take 400 mg by mouth 2 (two) times daily.     Lecithin 1200 MG Caps  Take 1,200 mg by mouth daily.     LIDAZONE HC 3-0.5 % Crea  Generic drug:  lidocaine-hydrocortisone  Place 1 Applicatorful rectally as needed (hemorrhoids).     Lutein 20 MG Tabs  Take 20 mg by mouth daily.     multivitamin with minerals Tabs tablet  Take 1 tablet by mouth daily.     Omega 3 1200 MG Caps  Take 1,200 mg by mouth 2 (two) times daily.     oxyCODONE-acetaminophen 5-325 MG tablet  Commonly known as:  ROXICET  Take 1-2 tablets by mouth every 4 (four) hours as needed for severe pain.     simvastatin 20 MG tablet  Commonly known as:  ZOCOR  Take 20 mg by mouth every evening.     triamcinolone cream 0.1 %  Commonly known as:  KENALOG  Apply 1 application topically 2 (two) times daily.     vitamin C 500 MG tablet  Commonly known as:  ASCORBIC ACID  Take 500 mg by mouth daily.      ASK your doctor about these medications        LASIX 20 MG tablet  Generic drug:  furosemide  Take 30 mg by mouth daily as needed for fluid.        Diagnostic Studies: Dg Hip Port Unilat With Pelvis 1v Left  11/02/2014  CLINICAL DATA:  Left hip replacement. EXAM: DG HIP (WITH OR WITHOUT PELVIS) 1V PORT LEFT COMPARISON:  C-arm radiographs obtained earlier today. FINDINGS: Bilateral hip prostheses. Skin clips and postoperative soft tissue air on the left. The prosthetic components are in satisfactory position and alignment. No fracture or dislocation seen. IMPRESSION: Satisfactory postoperative appearance of a left hip prosthesis. Electronically Signed   By: Claudie Revering M.D.   On: 11/02/2014 17:31   Dg Hip Operative Unilat With Pelvis Left  11/02/2014  CLINICAL DATA:  Left hip arthroplasty. EXAM: OPERATIVE LEFT HIP TECHNIQUE: Fluoroscopic spot image(s) were submitted for interpretation post-operatively. COMPARISON:  None.  FINDINGS: Intraoperative imaging shows normal  alignment of a total left hip arthroplasty. No abnormal lucency or fracture visualized. IMPRESSION: Normal alignment of left hip arthroplasty. Electronically Signed   By: Aletta Edouard M.D.   On: 11/02/2014 16:44    Disposition: Home with home health      Discharge Instructions    Weight bearing as tolerated    Complete by:  As directed   Laterality:  left  Extremity:  Lower           Follow-up Information    Follow up with [facility].   Why:  Someone from [facility] will contact you concerning start date and time for therapy.   Contact information:   [place] [address] [phone]       Follow up with Mcarthur Rossetti, MD. Schedule an appointment as soon as possible for a visit in 2 weeks.   Specialty:  Orthopedic Surgery   Contact information:   [place] [address] [phone]        Signed: Erskine Emery 11/05/2014, 8:48 AM

## 2014-11-05 NOTE — Progress Notes (Signed)
Physical Therapy Treatment Patient Details Name: [name] MRN: [id number] DOB: Jan 03, 1950 Today's Date: 11/05/2014    History of Present Illness Patient is a 64 y/o female s/p R THA. PMH includes obesity, HTN, hypercholesteremia.    PT Comments    On arrival pt in chair and states she is hurting for sitting to long (grossly 75 min). Pt was willing to attempt gait and aim for stairs but with initiation of gait stated she couldn't do it and returned to bed. Pt completed HEP and states she is will to walk and do stairs later today. Will attempt again later today.   Follow Up Recommendations  Home health PT;Supervision - Intermittent     Equipment Recommendations       Recommendations for Other Services       Precautions / Restrictions Precautions Precautions: Fall Precaution Comments: direct anterior hip-no precautions  Restrictions LLE Weight Bearing: Weight bearing as tolerated    Mobility  Bed Mobility   Bed Mobility: Sit to Supine       Sit to supine: Min assist   General bed mobility comments: min assist to raise LLE fully onto surface, increased time  Transfers     Transfers: Sit to/from Stand Sit to Stand: Supervision         General transfer comment: pt with good hand placement, supervision for safety  Ambulation/Gait Ambulation/Gait assistance: Supervision Ambulation Distance (Feet): 20 Feet Assistive device: Rolling walker (2 wheeled) Gait Pattern/deviations: Step-to pattern   Gait velocity interpretation: Below normal speed for age/gender General Gait Details: cues for posture, distance limited by fatigue with pt stating she will attempt gait and stairs later today   Stairs            Wheelchair Mobility    Modified Rankin (Stroke Patients Only)       Balance                                    Cognition Arousal/Alertness: Awake/alert Behavior During Therapy: WFL for tasks assessed/performed Overall Cognitive  Status: Within Functional Limits for tasks assessed                      Exercises Total Joint Exercises Hip ABduction/ADduction: AAROM;Left;15 reps;Supine Long Arc Quad: AROM;Seated;Left;15 Theatre manager in Standing: AROM;Seated;Left;15 reps    General Comments        Pertinent Vitals/Pain Pain Score: 6  Pain Location: left hip and back Pain Descriptors / Indicators: Aching;Burning Pain Intervention(s): Limited activity within patient's tolerance;Repositioned;Premedicated before session    Home Living                      Prior Function            PT Goals (current goals can now be found in the care plan section) Progress towards PT goals: Progressing toward goals    Frequency       PT Plan Current plan remains appropriate    Co-evaluation             End of Session   Activity Tolerance: Patient limited by pain Patient left: in bed;with call bell/phone within reach     Time: 0913-0923 PT Time Calculation (min) (ACUTE ONLY): 10 min  Charges:  $Therapeutic Exercise: 8-22 mins                    G Codes:  Lanetta Inch Beth 11/05/2014, 9:58 AM Elwyn Reach, [place]

## 2014-11-05 NOTE — Progress Notes (Signed)
Subjective: 3 Days Post-Op Procedure(s) (LRB): LEFT TOTAL HIP ARTHROPLASTY ANTERIOR APPROACH (Left) Patient reports pain as moderate some burning pain left thigh. .    Objective: Vital signs in last 24 hours: Temp:  [98.5 F (36.9 C)-99.8 F (37.7 C)] 99.6 F (37.6 C) (11/03 0535) Pulse Rate:  [74-89] 87 (11/03 0535) Resp:  [20] 20 (11/03 0535) BP: (107-131)/(44-54) 123/44 mmHg (11/03 0535) SpO2:  [96 %-100 %] 96 % (11/03 0535)  Intake/Output from previous day: 11/02 0701 - 11/03 0700 In: 240 [P.O.:240] Out: -  Intake/Output this shift:     Recent Labs  11/03/14 0528 11/04/14 0625  HGB 10.0* 9.5*    Recent Labs  11/03/14 0528 11/04/14 0625  WBC 7.9 8.7  RBC 3.52* 3.30*  HCT 31.4* 29.1*  PLT 171 159    Recent Labs  11/03/14 0528  NA 138  K 3.9  CL 104  CO2 27  BUN 11  CREATININE 0.58  GLUCOSE 99  CALCIUM 8.2*   No results for input(s): LABPT, INR in the last 72 hours.  Left lower extremity Sensation intact distally Intact pulses distally Dorsiflexion/Plantar flexion intact Incision: scant drainage Compartment soft  Assessment/Plan: 3 Days Post-Op Procedure(s) (LRB): LEFT TOTAL HIP ARTHROPLASTY ANTERIOR APPROACH (Left) Up with therapy  Patient told to pick up VIT b6 for nerve pain ,  Did not want to take Neurontin Dressing changed. Will discharge to home if patient does well with PT this morning.  Erskine Emery 11/05/2014, 8:37 AM

## 2014-11-05 NOTE — Discharge Instructions (Signed)

## 2014-11-05 NOTE — Progress Notes (Signed)
Physical Therapy Treatment Patient Details Name: [name] MRN: [id number] DOB: 1950-09-14 Today's Date: 11/05/2014    History of Present Illness Patient is a 64 y/o female s/p R THA. PMH includes obesity, HTN, hypercholesteremia.    PT Comments    [name] with increased activity tolerance this afternoon able to ambulate in the hallway and perform stairs along with HEP. She declined OOB to chair after session and encouraged continued mobility.   Follow Up Recommendations  Home health PT;Supervision - Intermittent     Equipment Recommendations       Recommendations for Other Services       Precautions / Restrictions Precautions Precautions: Fall Precaution Comments: direct anterior hip-no precautions  Restrictions LLE Weight Bearing: Weight bearing as tolerated    Mobility  Bed Mobility   Bed Mobility: Supine to Sit;Sit to Supine     Supine to sit: Modified independent (Device/Increase time) Sit to supine: Min assist   General bed mobility comments: min assist to raise LLE fully onto surface, increased time, use of rails. Pt able to get OOB without assist and use of rail   Transfers Overall transfer level: Modified independent                  Ambulation/Gait Ambulation/Gait assistance: Supervision Ambulation Distance (Feet): 250 Feet Assistive device: Rolling walker (2 wheeled) Gait Pattern/deviations: Step-to pattern   Gait velocity interpretation: Below normal speed for age/gender General Gait Details: cues for posture and looking up. Pt walked 125' seated rest then returned same distance   Stairs Stairs: Yes Stairs assistance: Modified independent (Device/Increase time) Stair Management: Step to pattern;Forwards;Two rails Number of Stairs: 6 General stair comments: pt with initial cues for sequence but was able to return demonstration and perform without difficulty  Wheelchair Mobility    Modified Rankin (Stroke Patients Only)        Balance                                    Cognition Arousal/Alertness: Awake/alert Behavior During Therapy: WFL for tasks assessed/performed Overall Cognitive Status: Within Functional Limits for tasks assessed                      Exercises Total Joint Exercises Hip ABduction/ADduction: AAROM;Left;15 reps;Supine Long Arc Quad: AROM;Seated;Left;15 Theatre manager in Standing: AROM;Seated;Left;15 reps    General Comments        Pertinent Vitals/Pain Pain Score: 6  Pain Location: left hip at rest Pain Descriptors / Indicators: Burning Pain Intervention(s): Limited activity within patient's tolerance;Premedicated before session;Repositioned    Home Living                      Prior Function            PT Goals (current goals can now be found in the care plan section) Progress towards PT goals: Progressing toward goals    Frequency       PT Plan Current plan remains appropriate    Co-evaluation             End of Session   Activity Tolerance: Patient tolerated treatment well Patient left: in bed;with call bell/phone within reach;with family/visitor present     Time: 1343-1406 PT Time Calculation (min) (ACUTE ONLY): 23 min  Charges:  $Gait Training: 8-22 mins $Therapeutic Exercise: 8-22 mins  G CodesMelford Aase 2014-11-14, 2:13 PM Elwyn Reach, [place]

## 2014-11-06 NOTE — Progress Notes (Signed)
Physical Therapy Treatment Patient Details Name: JOSUE KASS MRN: [id number] DOB: [birth year] Today's Date: 2014/11/17    History of Present Illness Patient is a 64 y/o female s/p R THA. PMH includes obesity, HTN, hypercholesteremia.    PT Comments    Pt with increased ability with transfers today, reports she feels less fatigued than yesterday and continued to ambulate in hall with decreased speed but good safety. Pt able to perform all HEP and safe for D/C.   Follow Up Recommendations  Home health PT;Supervision - Intermittent     Equipment Recommendations       Recommendations for Other Services       Precautions / Restrictions Precautions Precautions: Fall Precaution Comments: direct anterior hip-no precautions  Restrictions Weight Bearing Restrictions: Yes LLE Weight Bearing: Weight bearing as tolerated    Mobility  Bed Mobility Overal bed mobility: Modified Independent             General bed mobility comments: pt able to enter and exit bed without physical assist today with use of rail and increased time  Transfers Overall transfer level: Modified independent                  Ambulation/Gait Ambulation/Gait assistance: Supervision Ambulation Distance (Feet): 250 Feet Assistive device: Rolling walker (2 wheeled)   Gait velocity: decreased   General Gait Details: cues for posture and looking up. Pt walked 125' seated rest then returned same distance   Stairs            Wheelchair Mobility    Modified Rankin (Stroke Patients Only)       Balance                                    Cognition Arousal/Alertness: Awake/alert Behavior During Therapy: WFL for tasks assessed/performed Overall Cognitive Status: Within Functional Limits for tasks assessed                      Exercises Total Joint Exercises Hip ABduction/ADduction: Left;Supine;AROM;20 reps Long Arc Quad: AROM;Seated;Left;20 reps Marching in  Standing: AROM;Seated;Left;15 reps;20 reps    General Comments        Pertinent Vitals/Pain Pain Score: 7  Pain Location: left hip at end of gait Pain Descriptors / Indicators: Burning Pain Intervention(s): Limited activity within patient's tolerance;Premedicated before session;Repositioned    Home Living                      Prior Function            PT Goals (current goals can now be found in the care plan section) Progress towards PT goals: Progressing toward goals    Frequency  7X/week    PT Plan Current plan remains appropriate    Co-evaluation             End of Session   Activity Tolerance: Patient tolerated treatment well Patient left: in bed;with call bell/phone within reach     Time: 0906-0925 PT Time Calculation (min) (ACUTE ONLY): 19 min  Charges:  $Gait Training: 8-22 mins                    G Codes:      Melford Aase 2014-11-17, 9:47 AM Elwyn Reach, [place]

## 2014-11-06 NOTE — Discharge Summary (Signed)
Patient ID: [name] MRN: [id number] DOB/AGE: 03/10/1950 64 y.o.  Admit date: 11/02/2014 Discharge date: 11/06/2014  Admission Diagnoses:  Principal Problem:   Osteoarthritis of left hip Active Problems:   Status post total replacement of left hip   Discharge Diagnoses:  Same  Past Medical History  Diagnosis Date  . Obesity   . Hypercholesteremia   . Hypertension since 1990's  . Abnormal Pap smear of cervix 03/2009    Colpo Biopsy CIN I with HPV  . Scoliosis   . Asthma   . Bronchitis   . Bilateral edema of lower extremity     Takes lasix if needed  . Arthritis   . Teeth grinding   . Lumbar herniated disc   . Spinal stenosis   . Disc disorder     bulging disc in thoracic area    Surgeries: Procedure(s): LEFT TOTAL HIP ARTHROPLASTY ANTERIOR APPROACH on 11/02/2014   Consultants:    Discharged Condition: Improved  Hospital Course: [name] is an 64 y.o. female who was admitted 11/02/2014 for operative treatment ofOsteoarthritis of left hip. Patient has severe unremitting pain that affects sleep, daily activities, and work/hobbies. After pre-op clearance the patient was taken to the operating room on 11/02/2014 and underwent  Procedure(s): LEFT TOTAL HIP ARTHROPLASTY ANTERIOR APPROACH.    Patient was given perioperative antibiotics: Anti-infectives    Start     Dose/Rate Route Frequency Ordered Stop   11/02/14 2000  ceFAZolin (ANCEF) IVPB 2 g/50 mL premix     2 g 100 mL/hr over 30 Minutes Intravenous Every 6 hours 11/02/14 1839 11/03/14 0322   11/02/14 1400  ceFAZolin (ANCEF) IVPB 2 g/50 mL premix     2 g 100 mL/hr over 30 Minutes Intravenous On call to O.R. 10/30/14 1323 11/02/14 1532       Patient was given sequential compression devices, early ambulation, and chemoprophylaxis to prevent DVT.  Patient benefited maximally from hospital stay and there were no complications.    Recent vital signs: Patient Vitals for the past 24 hrs:  BP Temp Temp  src Pulse Resp SpO2  11/06/14 0425 (!) 108/40 mmHg 99.2 F (37.3 C) Oral 86 18 97 %  11/05/14 2253 135/62 mmHg 99.1 F (37.3 C) Oral 90 20 96 %  11/05/14 1300 (!) 127/50 mmHg 98.9 F (37.2 C) - 71 20 99 %  11/05/14 0839 (!) 124/44 mmHg - - 87 - -     Recent laboratory studies:  Recent Labs  11/04/14 0625  WBC 8.7  HGB 9.5*  HCT 29.1*  PLT 159     Discharge Medications:     Medication List    TAKE these medications        albuterol 108 (90 BASE) MCG/ACT inhaler  Commonly known as:  PROVENTIL HFA;VENTOLIN HFA  Inhale 2 puffs into the lungs every 6 (six) hours as needed for wheezing or shortness of breath.     aspirin 325 MG EC tablet  Take 1 tablet (325 mg total) by mouth 2 (two) times daily after a meal.     cholecalciferol 1000 UNITS tablet  Commonly known as:  VITAMIN D  Take 1,000 Units by mouth 2 (two) times daily.     Cinnamon 500 MG capsule  Take 500 mg by mouth 2 (two) times daily.     Co Q 10 100 MG Caps  Take 200 mg by mouth daily.     cyclobenzaprine 10 MG tablet  Commonly known as:  FLEXERIL  Take  10 mg by mouth 2 (two) times daily as needed for muscle spasms.     diclofenac 75 MG EC tablet  Commonly known as:  VOLTAREN  Take 1 tablet (75 mg total) by mouth 2 (two) times daily as needed for mild pain.     DUONEB 0.5-2.5 (3) MG/3ML Soln  Generic drug:  ipratropium-albuterol  Take 3 mLs by nebulization every 4 (four) hours as needed (shortness of breath).     felodipine 10 MG 24 hr tablet  Commonly known as:  PLENDIL  Take 10 mg by mouth daily.     FLAXSEED (LINSEED) PO  Take 1,300 mg by mouth daily.     Garlic 5852 MG Caps  Take 1,000 mg by mouth daily.     Grape Seed 50 MG Tabs  Take 50 mg by mouth daily.     irbesartan 300 MG tablet  Commonly known as:  AVAPRO  Take 300 mg by mouth daily.     labetalol 200 MG tablet  Commonly known as:  NORMODYNE  Take 400 mg by mouth 2 (two) times daily.     LASIX 20 MG tablet  Generic drug:   furosemide  Take 30 mg by mouth daily as needed for fluid.     Lecithin 1200 MG Caps  Take 1,200 mg by mouth daily.     LIDAZONE HC 3-0.5 % Crea  Generic drug:  lidocaine-hydrocortisone  Place 1 Applicatorful rectally as needed (hemorrhoids).     Lutein 20 MG Tabs  Take 20 mg by mouth daily.     multivitamin with minerals Tabs tablet  Take 1 tablet by mouth daily.     Omega 3 1200 MG Caps  Take 1,200 mg by mouth 2 (two) times daily.     oxyCODONE-acetaminophen 5-325 MG tablet  Commonly known as:  ROXICET  Take 1-2 tablets by mouth every 4 (four) hours as needed for severe pain.     simvastatin 20 MG tablet  Commonly known as:  ZOCOR  Take 20 mg by mouth every evening.     triamcinolone cream 0.1 %  Commonly known as:  KENALOG  Apply 1 application topically 2 (two) times daily.     vitamin C 500 MG tablet  Commonly known as:  ASCORBIC ACID  Take 500 mg by mouth daily.        Diagnostic Studies: Dg Hip Port Unilat With Pelvis 1v Left  11/02/2014  CLINICAL DATA:  Left hip replacement. EXAM: DG HIP (WITH OR WITHOUT PELVIS) 1V PORT LEFT COMPARISON:  C-arm radiographs obtained earlier today. FINDINGS: Bilateral hip prostheses. Skin clips and postoperative soft tissue air on the left. The prosthetic components are in satisfactory position and alignment. No fracture or dislocation seen. IMPRESSION: Satisfactory postoperative appearance of a left hip prosthesis. Electronically Signed   By: Claudie Revering M.D.   On: 11/02/2014 17:31   Dg Hip Operative Unilat With Pelvis Left  11/02/2014  CLINICAL DATA:  Left hip arthroplasty. EXAM: OPERATIVE LEFT HIP TECHNIQUE: Fluoroscopic spot image(s) were submitted for interpretation post-operatively. COMPARISON:  None. FINDINGS: Intraoperative imaging shows normal alignment of a total left hip arthroplasty. No abnormal lucency or fracture visualized. IMPRESSION: Normal alignment of left hip arthroplasty. Electronically Signed   By: Aletta Edouard M.D.   On: 11/02/2014 16:44    Disposition: to home      Discharge Instructions    Discharge patient    Complete by:  As directed      Weight bearing as tolerated  Complete by:  As directed   Laterality:  left  Extremity:  Lower           Follow-up Information    Follow up with [facility].   Why:  Someone from [facility] will contact you concerning start date and time for therapy.   Contact information:   [place] [address] [phone]       Follow up with Mcarthur Rossetti, MD. Schedule an appointment as soon as possible for a visit in 2 weeks.   Specialty:  Orthopedic Surgery   Contact information:   [place] [address] [phone]        Signed: Mcarthur Rossetti 11/06/2014, 6:52 AM

## 2014-11-06 NOTE — Progress Notes (Signed)
Patient ID: [name], female   DOB: 1950-04-14, 64 y.o.   MRN: [id number] Doing well overall.  Can be discharged to home today.

## 2014-11-10 ENCOUNTER — Encounter (HOSPITAL_COMMUNITY): Payer: Self-pay | Admitting: Emergency Medicine

## 2014-11-10 ENCOUNTER — Emergency Department (HOSPITAL_COMMUNITY)
Admission: EM | Admit: 2014-11-10 | Discharge: 2014-11-11 | Disposition: A | Discharge status: Home / Self Care | Payer: BC Managed Care – PPO | Attending: Emergency Medicine | Admitting: Emergency Medicine

## 2014-11-10 ENCOUNTER — Emergency Department (HOSPITAL_COMMUNITY): Payer: BC Managed Care – PPO

## 2014-11-10 DIAGNOSIS — Z7982 Long term (current) use of aspirin: Secondary | ICD-10-CM | POA: Insufficient documentation

## 2014-11-10 DIAGNOSIS — Z96641 Presence of right artificial hip joint: Secondary | ICD-10-CM | POA: Diagnosis not present

## 2014-11-10 DIAGNOSIS — J45909 Unspecified asthma, uncomplicated: Secondary | ICD-10-CM | POA: Diagnosis not present

## 2014-11-10 DIAGNOSIS — Z79899 Other long term (current) drug therapy: Secondary | ICD-10-CM | POA: Diagnosis not present

## 2014-11-10 DIAGNOSIS — M79605 Pain in left leg: Secondary | ICD-10-CM | POA: Diagnosis not present

## 2014-11-10 DIAGNOSIS — M96842 Postprocedural seroma of a musculoskeletal structure following a musculoskeletal system procedure: Secondary | ICD-10-CM | POA: Diagnosis not present

## 2014-11-10 DIAGNOSIS — I10 Essential (primary) hypertension: Secondary | ICD-10-CM | POA: Diagnosis not present

## 2014-11-10 DIAGNOSIS — E669 Obesity, unspecified: Secondary | ICD-10-CM | POA: Diagnosis not present

## 2014-11-10 DIAGNOSIS — Y658 Other specified misadventures during surgical and medical care: Secondary | ICD-10-CM | POA: Insufficient documentation

## 2014-11-10 DIAGNOSIS — M1990 Unspecified osteoarthritis, unspecified site: Secondary | ICD-10-CM | POA: Diagnosis not present

## 2014-11-10 DIAGNOSIS — E782 Mixed hyperlipidemia: Secondary | ICD-10-CM | POA: Diagnosis not present

## 2014-11-10 DIAGNOSIS — T148XXA Other injury of unspecified body region, initial encounter: Secondary | ICD-10-CM

## 2014-11-10 DIAGNOSIS — M7989 Other specified soft tissue disorders: Secondary | ICD-10-CM | POA: Diagnosis not present

## 2014-11-10 DIAGNOSIS — M79606 Pain in leg, unspecified: Secondary | ICD-10-CM

## 2014-11-10 LAB — CBC WITH DIFFERENTIAL/PLATELET
Basophils Absolute: 0 10*3/uL (ref 0.0–0.1)
Basophils Relative: 0 %
EOS PCT: 3 %
Eosinophils Absolute: 0.2 10*3/uL (ref 0.0–0.7)
HEMATOCRIT: 33.6 % — AB (ref 36.0–46.0)
Hemoglobin: 10.5 g/dL — ABNORMAL LOW (ref 12.0–15.0)
LYMPHS PCT: 28 %
Lymphs Abs: 2.3 10*3/uL (ref 0.7–4.0)
MCH: 27.6 pg (ref 26.0–34.0)
MCHC: 31.3 g/dL (ref 30.0–36.0)
MCV: 88.4 fL (ref 78.0–100.0)
MONO ABS: 0.4 10*3/uL (ref 0.1–1.0)
MONOS PCT: 5 %
NEUTROS ABS: 5.3 10*3/uL (ref 1.7–7.7)
Neutrophils Relative %: 64 %
PLATELETS: 337 10*3/uL (ref 150–400)
RBC: 3.8 MIL/uL — ABNORMAL LOW (ref 3.87–5.11)
RDW: 13.8 % (ref 11.5–15.5)
WBC: 8.3 10*3/uL (ref 4.0–10.5)

## 2014-11-10 LAB — BASIC METABOLIC PANEL
Anion gap: 9 (ref 5–15)
BUN: 11 mg/dL (ref 6–20)
CALCIUM: 8.8 mg/dL — AB (ref 8.9–10.3)
CO2: 29 mmol/L (ref 22–32)
CREATININE: 0.71 mg/dL (ref 0.44–1.00)
Chloride: 104 mmol/L (ref 101–111)
GFR calc Af Amer: >60 mL/min (ref >60)
GFR calc non Af Amer: >60 mL/min (ref >60)
GLUCOSE: 108 mg/dL — AB (ref 65–99)
Potassium: 4 mmol/L (ref 3.5–5.1)
Sodium: 142 mmol/L (ref 135–145)

## 2014-11-10 MED ORDER — IOHEXOL 300 MG/ML  SOLN
100.0000 mL | Freq: Once | INTRAMUSCULAR | Status: AC | PRN
  Administered 2014-11-10: 100 mL via INTRAVENOUS

## 2014-11-10 NOTE — ED Notes (Signed)
Pt. reports left leg swelling onset Sunday , s/p left hip replacement last 11/02/2014 , denies recent fall or injury . Advised by on-call MD to go to ER to rule out blood clot.

## 2014-11-10 NOTE — ED Provider Notes (Signed)
CSN: [id number]     Arrival date & time 11/10/14  1904 History   First MD Initiated Contact with Patient 11/10/14 2048     Chief Complaint  Patient presents with  . Leg Swelling     (Consider location/radiation/quality/duration/timing/severity/associated sxs/prior Treatment) Patient is a 64 y.o. female presenting with leg pain.  Leg Pain Location:  Hip and buttock Time since incident: 8 days since surgery, pain increased over last 3 days. Injury: no   Hip location:  L hip Buttock location:  L buttock Pain details:    Quality:  Aching   Radiates to: left hip/lateral leg radiating posteriorly.   Severity:  Moderate   Onset quality:  Gradual   Duration:  3 days   Timing:  Constant   Progression:  Worsening Chronicity:  New Prior injury to area: replacement of knee 8 days ago. Worsened by:  Nothing tried Ineffective treatments:  None tried Associated symptoms: no back pain, no fatigue, no fever, no neck pain, no stiffness, no swelling and no tingling     Past Medical History  Diagnosis Date  . Obesity   . Hypercholesteremia   . Hypertension since 1990's  . Abnormal Pap smear of cervix 03/2009    Colpo Biopsy CIN I with HPV  . Scoliosis   . Asthma   . Bronchitis   . Bilateral edema of lower extremity     Takes lasix if needed  . Arthritis   . Teeth grinding   . Lumbar herniated disc   . Spinal stenosis   . Disc disorder     bulging disc in thoracic area   Past Surgical History  Procedure Laterality Date  . Tonsillectomy and adenoidectomy    . Knee arthroscopy Right 1990  . Total knee arthroplasty Right 2009  . Shoulder arthroscopy Left 1994    arthritis, hips,fingers  . Appendectomy  1965  . Lumbar disc surgery  2006  . Cervical disc surgery  2007  . Back surgery  1993    Thoracic and Lumbar  . Hernia repair  1977, as child    bilateral inguinal -1 during pregnancy.  Umbilical repair as child  . Colonoscopy w/ polypectomy    . Breast biopsy Right 1990   benign cyst  . Total hip arthroplasty Right 09/08/2014    Procedure: RIGHT TOTAL HIP ARTHROPLASTY ANTERIOR APPROACH;  Surgeon: Mcarthur Rossetti, MD;  Location: [facility];  Service: Orthopedics;  Laterality: Right;  . Joint replacement    . Total hip arthroplasty Left 11/02/2014  . Total hip arthroplasty Left 11/02/2014    Procedure: LEFT TOTAL HIP ARTHROPLASTY ANTERIOR APPROACH;  Surgeon: Mcarthur Rossetti, MD;  Location: [facility];  Service: Orthopedics;  Laterality: Left;   Family History  Problem Relation Age of Onset  . Breast cancer Sister 77    bilateral mastectomy- Breast cancer   . Hypertension Mother   . Heart disease Mother   . Stroke Mother   . Heart disease Father   . Depression Father   . Alcohol abuse Father   . Stroke Maternal Grandmother   . Depression Daughter   . Emphysema Paternal Grandmother   . Breast cancer Maternal Aunt     Both aunts in 49 -41's  . Heart failure Paternal Grandfather   . Alcohol abuse Paternal Grandfather    Social History  Substance Use Topics  . Smoking status: Never Smoker   . Smokeless tobacco: Never Used  . Alcohol Use: Yes   OB History  Gravida Para Term Preterm AB TAB SAB Ectopic Multiple Living   1 1 1       1      Review of Systems  Constitutional: Negative for fever and fatigue.  HENT: Negative for sore throat.   Eyes: Negative for visual disturbance.  Respiratory: Negative for cough and shortness of breath.   Cardiovascular: Negative for chest pain.  Gastrointestinal: Positive for constipation. Negative for nausea, vomiting and abdominal pain.  Genitourinary: Negative for difficulty urinating.  Musculoskeletal: Positive for myalgias and arthralgias. Negative for back pain, stiffness and neck pain.  Skin: Negative for rash.  Neurological: Negative for syncope and headaches.      Allergies  Lortab  Home Medications   Prior to Admission medications   Medication Sig Start Date End Date Taking? Authorizing  Provider  albuterol (PROVENTIL HFA;VENTOLIN HFA) 108 (90 BASE) MCG/ACT inhaler Inhale 2 puffs into the lungs every 6 (six) hours as needed for wheezing or shortness of breath.   Yes Historical Provider, MD  aspirin 325 MG EC tablet Take 1 tablet (325 mg total) by mouth 2 (two) times daily after a meal. 11/05/14  Yes Pete Pelt, PA-C  cholecalciferol (VITAMIN D) 1000 UNITS tablet Take 1,000 Units by mouth 2 (two) times daily.   Yes Historical Provider, MD  Cinnamon 500 MG capsule Take 500 mg by mouth 2 (two) times daily.   Yes Historical Provider, MD  Coenzyme Q10 (CO Q 10) 100 MG CAPS Take 200 mg by mouth at bedtime.    Yes Historical Provider, MD  cyclobenzaprine (FLEXERIL) 10 MG tablet Take 10 mg by mouth 2 (two) times daily as needed for muscle spasms.    Yes Historical Provider, MD  diclofenac (VOLTAREN) 75 MG EC tablet Take 1 tablet (75 mg total) by mouth 2 (two) times daily as needed for mild pain. 11/05/14  Yes Pete Pelt, PA-C  felodipine (PLENDIL) 10 MG 24 hr tablet Take 10 mg by mouth daily.   Yes Historical Provider, MD  FLAXSEED, LINSEED, PO Take 1,300 mg by mouth at bedtime.    Yes Historical Provider, MD  furosemide (LASIX) 20 MG tablet Take 30 mg by mouth daily as needed for fluid.    Yes Historical Provider, MD  Garlic 7106 MG CAPS Take 1,000 mg by mouth daily.   Yes Historical Provider, MD  Grape Seed 50 MG TABS Take 50 mg by mouth daily.   Yes Historical Provider, MD  ipratropium-albuterol (DUONEB) 0.5-2.5 (3) MG/3ML SOLN Take 3 mLs by nebulization every 4 (four) hours as needed (shortness of breath).    Yes Historical Provider, MD  irbesartan (AVAPRO) 300 MG tablet Take 300 mg by mouth daily.   Yes Historical Provider, MD  labetalol (NORMODYNE) 200 MG tablet Take 400 mg by mouth 2 (two) times daily.    Yes Historical Provider, MD  Lecithin 1200 MG CAPS Take 1,200 mg by mouth daily.   Yes Historical Provider, MD  lidocaine-hydrocortisone (LIDAZONE HC) 3-0.5 % CREA Place 1  Applicatorful rectally as needed (hemorrhoids).    Yes Historical Provider, MD  Lutein 20 MG TABS Take 20 mg by mouth at bedtime.    Yes Historical Provider, MD  Multiple Vitamin (MULTIVITAMIN WITH MINERALS) TABS tablet Take 0.5 tablets by mouth 2 (two) times daily.    Yes Historical Provider, MD  Omega 3 1200 MG CAPS Take 1,200 mg by mouth 2 (two) times daily.   Yes Historical Provider, MD  oxyCODONE-acetaminophen (ROXICET) 5-325 MG tablet Take 1-2 tablets  by mouth every 4 (four) hours as needed for severe pain. 11/05/14  Yes Pete Pelt, PA-C  simvastatin (ZOCOR) 20 MG tablet Take 20 mg by mouth every evening.    Yes Historical Provider, MD  triamcinolone cream (KENALOG) 0.1 % Apply 1 application topically 2 (two) times daily as needed (affected area).    Yes Historical Provider, MD  vitamin C (ASCORBIC ACID) 500 MG tablet Take 500 mg by mouth daily.   Yes Historical Provider, MD   BP 139/67 mmHg  Pulse 84  Temp(Src) 98.6 F (37 C) (Oral)  Resp 18  SpO2 97%  LMP 01/14/2014 Physical Exam  Constitutional: She is oriented to person, place, and time. She appears well-developed and well-nourished. No distress.  HENT:  Head: Normocephalic and atraumatic.  Eyes: Conjunctivae and EOM are normal.  Neck: Normal range of motion.  Cardiovascular: Normal rate, regular rhythm, normal heart sounds and intact distal pulses.  Exam reveals no gallop and no friction rub.   No murmur heard. Pulmonary/Chest: Effort normal and breath sounds normal. No respiratory distress. She has no wheezes. She has no rales.  Abdominal: Soft. She exhibits no distension. There is no tenderness. There is no guarding.  Musculoskeletal: She exhibits tenderness.       Left hip: She exhibits swelling.       Left upper leg: She exhibits tenderness (area of induration inferior to incision with tenderness/tenderness to lateral thigh, swelling of thigh, no significant erythema, bandage clearn/dry), swelling and edema. She  exhibits no deformity. Lacerations: wound incision under bandage.  Contusion popliteal fossa Normal pulses  Neurological: She is alert and oriented to person, place, and time.  Skin: Skin is warm and dry. No rash noted. She is not diaphoretic. No erythema.  Nursing note and vitals reviewed.   ED Course  Procedures (including critical care time) Labs Review Labs Reviewed  CBC WITH DIFFERENTIAL/PLATELET - Abnormal; Notable for the following:    RBC 3.80 (*)    Hemoglobin 10.5 (*)    HCT 33.6 (*)    All other components within normal limits  BASIC METABOLIC PANEL - Abnormal; Notable for the following:    Glucose, Bld 108 (*)    Calcium 8.8 (*)    All other components within normal limits  D-DIMER, QUANTITATIVE (NOT AT [facility]) - Abnormal; Notable for the following:    D-Dimer, Quant 5.74 (*)    All other components within normal limits  PROTIME-INR    Imaging Review Ct Hip Left W Contrast  11/10/2014  CLINICAL DATA:  Left hip replacement 11/02/2014, now with leg swelling and pain. Rule out abscess. EXAM: CT OF THE LEFT HIP WITH CONTRAST TECHNIQUE: Multidetector CT imaging was performed following the standard protocol during bolus administration of intravenous contrast. CONTRAST:  139mL OMNIPAQUE IOHEXOL 300 MG/ML  SOLN COMPARISON:  None. FINDINGS: Left hip arthroplasty with surrounding streak artifact. There is heterogeneous inflammatory appearance of the anterior thigh musculature compartment, primarily involving vastus lateralis and medialis. A subcutaneous lobulated heterogeneous collection is seen adjacent to vastus lateralis in the subcutaneous tissues, exact measurement difficult due to the irregular nature, however measures at least 10.8 cm in cranial caudal dimension. There are small foci of air within this collection superiorly. Soft tissue edema tracks throughout the right thigh with a thin curvilinear intramuscular fluid collection within vastus lateralis distally containing small  foci of intramuscular air. This measures 11.7 cm cranial caudal dimension and less than 2 cm in thickness. No periprosthetic lucency or fracture about the left  hip prosthesis. Right hip prosthesis also noted. A stool ball distends the rectum. A small intramuscular lipoma is noted within distal rectus femorals muscle 3.0 x 4.7 cm. IMPRESSION: 1. Heterogeneous appearance of the anterior thigh musculature, with a thin curvilinear intramuscular fluid collection distally within vastus lateralis containing small foci of air. Findings are concerning for intramuscular infection, though a discrete drainable abscess is not seen. 2. Lobulated heterogeneous collection in the anterior lateral left thigh adjacent the vastus lateralis, postoperative hematoma is favored, however superimposed infection cannot be excluded. Electronically Signed   By: Jeb Levering M.D.   On: 11/10/2014 23:35   I have personally reviewed and evaluated these images and lab results as part of my medical decision-making.   EKG Interpretation None      MDM   Final diagnoses:  Leg pain  Left leg pain, possible DVT versus left thigh musculature infection  Hematoma, post-operative    64 year old female with a history of hypertension, hyperlipidemia, status post replacement of the right hip in September, replacement of the left hip by Dr. Ninfa Linden 10/31 presents with concern for left leg swelling and pain.  While exam is somewhat limited by body habitus, there is no significant difference in lower leg swelling, however she has pain and tenderness over lateral thigh with swelling of thigh.  Given increased pain, localized tenderness, ordered CT to evaluate for infection.  CT shows area of hematoma, and inflammatory changes in thigh musculature possibly concerning for infection.  Discussed with Dr. Louanne Skye who recommends follow up with Orthopedic physician (Dr. Ninfa Linden or himself) first thing in the morning for evaluation.  Area does not  appear clearly cellulitic, pt without fever and no leukocytosis and do not feel admission is indicated at this time if pt is able to follow up closely with Orthopedic physician. Ordered lovenox to be given for possible DVT and DVT US to be performed tomorrow. (no cp/sob to suggest PE)  Patient discharged in stable condition with understanding of reasons to return.         Gareth Morgan, MD 11/11/14 [phone]

## 2014-11-11 ENCOUNTER — Ambulatory Visit (EMERGENCY_DEPARTMENT_HOSPITAL)
Admission: RE | Admit: 2014-11-11 | Discharge: 2014-11-11 | Disposition: A | Discharge status: Home / Self Care | Payer: BC Managed Care – PPO | Source: Ambulatory Visit | Attending: Emergency Medicine | Admitting: Emergency Medicine

## 2014-11-11 DIAGNOSIS — M7989 Other specified soft tissue disorders: Secondary | ICD-10-CM | POA: Diagnosis not present

## 2014-11-11 LAB — D-DIMER, QUANTITATIVE: D-Dimer, Quant: 5.74 ug/mL-FEU — ABNORMAL HIGH (ref 0.00–0.48)

## 2014-11-11 LAB — PROTIME-INR
INR: 1.1 (ref 0.00–1.49)
PROTHROMBIN TIME: 14.4 s (ref 11.6–15.2)

## 2014-11-11 MED ORDER — ENOXAPARIN SODIUM 120 MG/0.8ML ~~LOC~~ SOLN
1.0000 mg/kg | Freq: Once | SUBCUTANEOUS | Status: AC
  Administered 2014-11-11: 110 mg via SUBCUTANEOUS
  Filled 2014-11-11: qty 0.8

## 2014-11-11 NOTE — ED Notes (Signed)
Pt stable, ambulatory, states understanding of discharge instructions 

## 2014-11-11 NOTE — Discharge Instructions (Signed)

## 2014-11-11 NOTE — Progress Notes (Signed)
*  Preliminary Results* Left lower extremity venous duplex completed. Left lower extremity is negative for deep vein thrombosis. There is no evidence of left Baker's cyst.  11/11/2014 9:42 AM  Maudry Mayhew, RVT, RDCS, RDMS

## 2015-02-19 ENCOUNTER — Emergency Department (HOSPITAL_COMMUNITY)
Admission: EM | Admit: 2015-02-19 | Discharge: 2015-02-19 | Disposition: A | Discharge status: Home / Self Care | Payer: BC Managed Care – PPO | Attending: Emergency Medicine | Admitting: Emergency Medicine

## 2015-02-19 ENCOUNTER — Encounter (HOSPITAL_COMMUNITY): Payer: Self-pay | Admitting: Family Medicine

## 2015-02-19 ENCOUNTER — Emergency Department (HOSPITAL_COMMUNITY): Payer: BC Managed Care – PPO

## 2015-02-19 DIAGNOSIS — I10 Essential (primary) hypertension: Secondary | ICD-10-CM | POA: Insufficient documentation

## 2015-02-19 DIAGNOSIS — J45901 Unspecified asthma with (acute) exacerbation: Secondary | ICD-10-CM | POA: Diagnosis not present

## 2015-02-19 DIAGNOSIS — M1990 Unspecified osteoarthritis, unspecified site: Secondary | ICD-10-CM | POA: Insufficient documentation

## 2015-02-19 DIAGNOSIS — R05 Cough: Secondary | ICD-10-CM

## 2015-02-19 DIAGNOSIS — E7800 Pure hypercholesterolemia, unspecified: Secondary | ICD-10-CM | POA: Insufficient documentation

## 2015-02-19 DIAGNOSIS — Z8659 Personal history of other mental and behavioral disorders: Secondary | ICD-10-CM | POA: Insufficient documentation

## 2015-02-19 DIAGNOSIS — R059 Cough, unspecified: Secondary | ICD-10-CM

## 2015-02-19 DIAGNOSIS — Z79899 Other long term (current) drug therapy: Secondary | ICD-10-CM | POA: Diagnosis not present

## 2015-02-19 DIAGNOSIS — J40 Bronchitis, not specified as acute or chronic: Secondary | ICD-10-CM

## 2015-02-19 DIAGNOSIS — E669 Obesity, unspecified: Secondary | ICD-10-CM | POA: Diagnosis not present

## 2015-02-19 LAB — CBC WITH DIFFERENTIAL/PLATELET
Basophils Absolute: 0 10*3/uL (ref 0.0–0.1)
Basophils Relative: 0 %
EOS PCT: 0 %
Eosinophils Absolute: 0 10*3/uL (ref 0.0–0.7)
HCT: 44.4 % (ref 36.0–46.0)
Hemoglobin: 14 g/dL (ref 12.0–15.0)
LYMPHS ABS: 1.3 10*3/uL (ref 0.7–4.0)
LYMPHS PCT: 13 %
MCH: 26.4 pg (ref 26.0–34.0)
MCHC: 31.5 g/dL (ref 30.0–36.0)
MCV: 83.6 fL (ref 78.0–100.0)
Monocytes Absolute: 0.2 10*3/uL (ref 0.1–1.0)
Monocytes Relative: 2 %
NEUTROS ABS: 8.1 10*3/uL — AB (ref 1.7–7.7)
Neutrophils Relative %: 85 %
PLATELETS: 279 10*3/uL (ref 150–400)
RBC: 5.31 MIL/uL — AB (ref 3.87–5.11)
RDW: 15.5 % (ref 11.5–15.5)
WBC: 9.6 10*3/uL (ref 4.0–10.5)

## 2015-02-19 LAB — COMPREHENSIVE METABOLIC PANEL
ALT: 22 U/L (ref 14–54)
AST: 25 U/L (ref 15–41)
Albumin: 3.8 g/dL (ref 3.5–5.0)
Alkaline Phosphatase: 100 U/L (ref 38–126)
Anion gap: 12 (ref 5–15)
BUN: 15 mg/dL (ref 6–20)
CHLORIDE: 104 mmol/L (ref 101–111)
CO2: 27 mmol/L (ref 22–32)
CREATININE: 0.77 mg/dL (ref 0.44–1.00)
Calcium: 9.3 mg/dL (ref 8.9–10.3)
Glucose, Bld: 180 mg/dL — ABNORMAL HIGH (ref 65–99)
Potassium: 4.2 mmol/L (ref 3.5–5.1)
Sodium: 143 mmol/L (ref 135–145)
Total Bilirubin: 0.5 mg/dL (ref 0.3–1.2)
Total Protein: 7.2 g/dL (ref 6.5–8.1)

## 2015-02-19 MED ORDER — ALBUTEROL SULFATE HFA 108 (90 BASE) MCG/ACT IN AERS
INHALATION_SPRAY | RESPIRATORY_TRACT | Status: DC
Start: 2015-02-19 — End: 2015-02-20
  Filled 2015-02-19: qty 6.7

## 2015-02-19 MED ORDER — IPRATROPIUM-ALBUTEROL 0.5-2.5 (3) MG/3ML IN SOLN
3.0000 mL | Freq: Once | RESPIRATORY_TRACT | Status: AC
  Administered 2015-02-19: 3 mL via RESPIRATORY_TRACT
  Filled 2015-02-19: qty 3

## 2015-02-19 MED ORDER — IPRATROPIUM-ALBUTEROL 0.5-2.5 (3) MG/3ML IN SOLN: 3.0000 mL | Freq: Four times a day (QID) | RESPIRATORY_TRACT | Status: DC

## 2015-02-19 MED ORDER — IPRATROPIUM-ALBUTEROL 0.5-2.5 (3) MG/3ML IN SOLN
3.0000 mL | Freq: Four times a day (QID) | RESPIRATORY_TRACT | Status: DC
  Filled 2015-02-19: qty 3

## 2015-02-19 MED ORDER — PREDNISONE 20 MG PO TABS
60.0000 mg | ORAL_TABLET | Freq: Once | ORAL | Status: AC
Start: 2015-02-19 — End: 2015-02-19
  Administered 2015-02-19: 60 mg via ORAL
  Filled 2015-02-19: qty 3

## 2015-02-19 MED ORDER — ALBUTEROL SULFATE HFA 108 (90 BASE) MCG/ACT IN AERS
4.0000 | INHALATION_SPRAY | Freq: Once | RESPIRATORY_TRACT | Status: AC
  Administered 2015-02-19: 4 via RESPIRATORY_TRACT
  Filled 2015-02-19: qty 6.7

## 2015-02-19 MED ORDER — IPRATROPIUM-ALBUTEROL 0.5-2.5 (3) MG/3ML IN SOLN
3.0000 mL | Freq: Once | RESPIRATORY_TRACT | Status: DC
Start: 2015-02-19 — End: 2015-02-20
  Filled 2015-02-19: qty 3

## 2015-02-19 MED ORDER — PREDNISONE 20 MG PO TABS: 20.0000 mg | ORAL_TABLET | Freq: Every day | ORAL | Status: AC

## 2015-02-19 MED ORDER — AEROCHAMBER PLUS W/MASK MISC
1.0000 | Freq: Once | Status: AC
  Administered 2015-02-19: 1
  Filled 2015-02-19: qty 1

## 2015-02-19 MED ORDER — ALBUTEROL SULFATE (2.5 MG/3ML) 0.083% IN NEBU: 5.0000 mg | INHALATION_SOLUTION | Freq: Once | RESPIRATORY_TRACT | Status: DC

## 2015-02-19 NOTE — Discharge Instructions (Signed)
Asthma, Acute Bronchospasm °Acute bronchospasm caused by asthma is also referred to as an asthma attack. Bronchospasm means your air passages become narrowed. The narrowing is caused by inflammation and tightening of the muscles in the air tubes (bronchi) in your lungs. This can make it hard to breathe or cause you to wheeze and cough. °CAUSES °Possible triggers are: °· Animal dander from the skin, hair, or feathers of animals. °· Dust mites contained in house dust. °· Cockroaches. °· Pollen from trees or grass. °· Mold. °· Cigarette or tobacco smoke. °· Air pollutants such as dust, household cleaners, hair sprays, aerosol sprays, paint fumes, strong chemicals, or strong odors. °· Cold air or weather changes. Cold air may trigger inflammation. Winds increase molds and pollens in the air. °· Strong emotions such as crying or laughing hard. °· Stress. °· Certain medicines such as aspirin or beta-blockers. °· Sulfites in foods and drinks, such as dried fruits and wine. °· Infections or inflammatory conditions, such as a flu, cold, or inflammation of the nasal membranes (rhinitis). °· Gastroesophageal reflux disease (GERD). GERD is a condition where stomach acid backs up into your esophagus. °· Exercise or strenuous activity. °SIGNS AND SYMPTOMS  °· Wheezing. °· Excessive coughing, particularly at night. °· Chest tightness. °· Shortness of breath. °DIAGNOSIS  °Your health care provider will ask you about your medical history and perform a physical exam. A chest X-ray or blood testing may be performed to look for other causes of your symptoms or other conditions that may have triggered your asthma attack.  °TREATMENT  °Treatment is aimed at reducing inflammation and opening up the airways in your lungs.  Most asthma attacks are treated with inhaled medicines. These include quick relief or rescue medicines (such as bronchodilators) and controller medicines (such as inhaled corticosteroids). These medicines are sometimes  given through an inhaler or a nebulizer. Systemic steroid medicine taken by mouth or given through an IV tube also can be used to reduce the inflammation when an attack is moderate or severe. Antibiotic medicines are only used if a bacterial infection is present.  °HOME CARE INSTRUCTIONS  °· Rest. °· Drink plenty of liquids. This helps the mucus to remain thin and be easily coughed up. Only use caffeine in moderation and do not use alcohol until you have recovered from your illness. °· Do not smoke. Avoid being exposed to secondhand smoke. °· You play a critical role in keeping yourself in good health. Avoid exposure to things that cause you to wheeze or to have breathing problems. °· Keep your medicines up-to-date and available. Carefully follow your health care provider's treatment plan. °· Take your medicine exactly as prescribed. °· When pollen or pollution is bad, keep windows closed and use an air conditioner or go to places with air conditioning. °· Asthma requires careful medical care. See your health care provider for a follow-up as advised. If you are more than [redacted] weeks pregnant and you were prescribed any new medicines, let your obstetrician know about the visit and how you are doing. Follow up with your health care provider as directed. °· After you have recovered from your asthma attack, make an appointment with your outpatient doctor to talk about ways to reduce the likelihood of future attacks. If you do not have a doctor who manages your asthma, make an appointment with a primary care doctor to discuss your asthma. °SEEK IMMEDIATE MEDICAL CARE IF:  °· You are getting worse. °· You have trouble breathing. If severe, call your local   emergency services (911 in the [place]  You develop chest pain or discomfort.  You are vomiting.  You are not able to keep fluids down.  You are coughing up yellow, green, brown, or bloody sputum.  You have a fever and your symptoms suddenly get worse.  You have  trouble swallowing. MAKE SURE YOU:   Understand these instructions.  Will watch your condition.  Will get help right away if you are not doing well or get worse.   This information is not intended to replace advice given to you by your health care provider. Make sure you discuss any questions you have with your health care provider.   Document Released: 04/05/2006 Document Revised: 12/24/2012 Document Reviewed: 06/26/2012 Elsevier Interactive Patient Education 2016 [employer] Acute Bronchitis Bronchitis is inflammation of the airways that extend from the windpipe into the lungs (bronchi). The inflammation often causes mucus to develop. This leads to a cough, which is the most common symptom of bronchitis.  In acute bronchitis, the condition usually develops suddenly and goes away over time, usually in a couple weeks. Smoking, allergies, and asthma can make bronchitis worse. Repeated episodes of bronchitis may cause further lung problems.  CAUSES Acute bronchitis is most often caused by the same virus that causes a cold. The virus can spread from person to person (contagious) through coughing, sneezing, and touching contaminated objects. SIGNS AND SYMPTOMS   Cough.   Fever.   Coughing up mucus.   Body aches.   Chest congestion.   Chills.   Shortness of breath.   Sore throat.  DIAGNOSIS  Acute bronchitis is usually diagnosed through a physical exam. Your health care provider will also ask you questions about your medical history. Tests, such as chest X-rays, are sometimes done to rule out other conditions.  TREATMENT  Acute bronchitis usually goes away in a couple weeks. Oftentimes, no medical treatment is necessary. Medicines are sometimes given for relief of fever or cough. Antibiotic medicines are usually not needed but may be prescribed in certain situations. In some cases, an inhaler may be recommended to help reduce shortness of breath and control the cough. A  cool mist vaporizer may also be used to help thin bronchial secretions and make it easier to clear the chest.  HOME CARE INSTRUCTIONS  Get plenty of rest.   Drink enough fluids to keep your urine clear or pale yellow (unless you have a medical condition that requires fluid restriction). Increasing fluids may help thin your respiratory secretions (sputum) and reduce chest congestion, and it will prevent dehydration.   Take medicines only as directed by your health care provider.  If you were prescribed an antibiotic medicine, finish it all even if you start to feel better.  Avoid smoking and secondhand smoke. Exposure to cigarette smoke or irritating chemicals will make bronchitis worse. If you are a smoker, consider using nicotine gum or skin patches to help control withdrawal symptoms. Quitting smoking will help your lungs heal faster.   Reduce the chances of another bout of acute bronchitis by washing your hands frequently, avoiding people with cold symptoms, and trying not to touch your hands to your mouth, nose, or eyes.   Keep all follow-up visits as directed by your health care provider.  SEEK MEDICAL CARE IF: Your symptoms do not improve after 1 week of treatment.  SEEK IMMEDIATE MEDICAL CARE IF:  You develop an increased fever or chills.   You have chest pain.   You have severe shortness of  breath.  You have bloody sputum.   You develop dehydration.  You faint or repeatedly feel like you are going to pass out.  You develop repeated vomiting.  You develop a severe headache. MAKE SURE YOU:   Understand these instructions.  Will watch your condition.  Will get help right away if you are not doing well or get worse.   This information is not intended to replace advice given to you by your health care provider. Make sure you discuss any questions you have with your health care provider.   Document Released: 01/27/2004 Document Revised: 01/09/2014 Document  Reviewed: 06/11/2012 Elsevier Interactive Patient Education [employer].

## 2015-02-19 NOTE — ED Notes (Signed)
Pt here for worsening URI over the past week. sts has been given multiple doses of prednisone, abx and breathing treatments and not better.

## 2015-02-19 NOTE — ED Provider Notes (Signed)
CSN: [id number]     Arrival date & time 02/19/15  1523 History   First MD Initiated Contact with Patient 02/19/15 1751     Chief Complaint  Patient presents with  . Cough  . Asthma     (Consider location/radiation/quality/duration/timing/severity/associated sxs/prior Treatment) Patient is a 65 y.o. female presenting with cough and asthma.  Cough Cough characteristics:  Non-productive Severity:  Moderate Onset quality:  Gradual Duration:  1 week Timing:  Constant Progression:  Unchanged Chronicity:  New Smoker: no   Context: upper respiratory infection   Relieved by:  Nothing Worsened by:  Nothing tried Ineffective treatments: breathing tx and prednisone. Associated symptoms: rhinorrhea, shortness of breath and sinus congestion   Associated symptoms: no chest pain, no chills, no ear pain, no fever, no headaches, no rash and no sore throat   Asthma Associated symptoms include coughing. Pertinent negatives include no abdominal pain, chest pain, chills, congestion, fatigue, fever, headaches, nausea, rash, sore throat, vomiting or weakness.    Past Medical History  Diagnosis Date  . Obesity   . Hypercholesteremia   . Hypertension since 1990's  . Abnormal Pap smear of cervix 03/2009    Colpo Biopsy CIN I with HPV  . Scoliosis   . Asthma   . Bronchitis   . Bilateral edema of lower extremity     Takes lasix if needed  . Arthritis   . Teeth grinding   . Lumbar herniated disc   . Spinal stenosis   . Disc disorder     bulging disc in thoracic area   Past Surgical History  Procedure Laterality Date  . Tonsillectomy and adenoidectomy    . Knee arthroscopy Right 1990  . Total knee arthroplasty Right 2009  . Shoulder arthroscopy Left 1994    arthritis, hips,fingers  . Appendectomy  1965  . Lumbar disc surgery  2006  . Cervical disc surgery  2007  . Back surgery  1993    Thoracic and Lumbar  . Hernia repair  1977, as child    bilateral inguinal -1 during pregnancy.   Umbilical repair as child  . Colonoscopy w/ polypectomy    . Breast biopsy Right 1990    benign cyst  . Total hip arthroplasty Right 09/08/2014    Procedure: RIGHT TOTAL HIP ARTHROPLASTY ANTERIOR APPROACH;  Surgeon: Mcarthur Rossetti, MD;  Location: [facility];  Service: Orthopedics;  Laterality: Right;  . Joint replacement    . Total hip arthroplasty Left 11/02/2014  . Total hip arthroplasty Left 11/02/2014    Procedure: LEFT TOTAL HIP ARTHROPLASTY ANTERIOR APPROACH;  Surgeon: Mcarthur Rossetti, MD;  Location: [facility];  Service: Orthopedics;  Laterality: Left;   Family History  Problem Relation Age of Onset  . Breast cancer Sister 65    bilateral mastectomy- Breast cancer   . Hypertension Mother   . Heart disease Mother   . Stroke Mother   . Heart disease Father   . Depression Father   . Alcohol abuse Father   . Stroke Maternal Grandmother   . Depression Daughter   . Emphysema Paternal Grandmother   . Breast cancer Maternal Aunt     Both aunts in 60 -32's  . Heart failure Paternal Grandfather   . Alcohol abuse Paternal Grandfather    Social History  Substance Use Topics  . Smoking status: Never Smoker   . Smokeless tobacco: Never Used  . Alcohol Use: Yes   OB History    Gravida Para Term Preterm AB TAB SAB  Ectopic Multiple Living   1 1 1       1      Review of Systems  Constitutional: Negative for fever, chills, appetite change and fatigue.  HENT: Positive for rhinorrhea. Negative for congestion, ear pain, facial swelling, mouth sores and sore throat.   Eyes: Negative for visual disturbance.  Respiratory: Positive for cough and shortness of breath. Negative for chest tightness.   Cardiovascular: Negative for chest pain and palpitations.  Gastrointestinal: Negative for nausea, vomiting, abdominal pain, diarrhea and blood in stool.  Endocrine: Negative for cold intolerance and heat intolerance.  Genitourinary: Negative for frequency, decreased urine volume and  difficulty urinating.  Musculoskeletal: Negative for back pain and neck stiffness.  Skin: Negative for rash.  Neurological: Negative for dizziness, weakness, light-headedness and headaches.  All other systems reviewed and are negative.     Allergies  Adhesive and Lortab  Home Medications   Prior to Admission medications   Medication Sig Start Date End Date Taking? Authorizing Provider  albuterol (PROAIR HFA) 108 (90 Base) MCG/ACT inhaler Inhale 2 puffs into the lungs every 6 (six) hours as needed for wheezing or shortness of breath.   Yes Historical Provider, MD  amoxicillin-clavulanate (AUGMENTIN) 875-125 MG tablet Take 1 tablet by mouth every 12 (twelve) hours. 10 day course started 02/15/15 02/15/15  Yes Historical Provider, MD  benzonatate (TESSALON) 200 MG capsule Take 20 mg by mouth 3 (three) times daily as needed for cough.  02/18/15  Yes Historical Provider, MD  chlorpheniramine-HYDROcodone (TUSSIONEX) 10-8 MG/5ML SUER Take 2.5 mLs by mouth at bedtime as needed for cough.  02/15/15  Yes Historical Provider, MD  cholecalciferol (VITAMIN D) 1000 UNITS tablet Take 1,000 Units by mouth 2 (two) times daily.   Yes Historical Provider, MD  Cinnamon 500 MG capsule Take 500 mg by mouth 2 (two) times daily.   Yes Historical Provider, MD  Coenzyme Q10 (COQ10) 200 MG CAPS Take 200 mg by mouth at bedtime.   Yes Historical Provider, MD  cyclobenzaprine (FLEXERIL) 10 MG tablet Take 10 mg by mouth 2 (two) times daily as needed for muscle spasms.    Yes Historical Provider, MD  diclofenac (VOLTAREN) 75 MG EC tablet Take 1 tablet (75 mg total) by mouth 2 (two) times daily as needed for mild pain. 11/05/14  Yes Pete Pelt, PA-C  docusate sodium (COLACE) 100 MG capsule Take 200 mg by mouth at bedtime.   Yes Historical Provider, MD  felodipine (PLENDIL) 10 MG 24 hr tablet Take 10 mg by mouth daily.   Yes Historical Provider, MD  FLAXSEED, LINSEED, PO Take 1,300 mg by mouth daily.    Yes Historical  Provider, MD  furosemide (LASIX) 20 MG tablet Take 20-30 mg by mouth daily. Dose is dependent on amount of leg swelling   Yes Historical Provider, MD  Garlic [id number] MG CAPS Take 1,000 mg by mouth daily.   Yes Historical Provider, MD  Grape Seed 50 MG TABS Take 50 mg by mouth daily.   Yes Historical Provider, MD  Guaifenesin (MUCINEX MAXIMUM STRENGTH) 1200 MG TB12 Take 1,200 mg by mouth every 12 (twelve) hours.   Yes Historical Provider, MD  ipratropium-albuterol (DUONEB) 0.5-2.5 (3) MG/3ML SOLN Take 3 mLs by nebulization every 4 (four) hours.    Yes Historical Provider, MD  irbesartan (AVAPRO) 300 MG tablet Take 300 mg by mouth daily.   Yes Historical Provider, MD  labetalol (NORMODYNE) 200 MG tablet Take 400 mg by mouth 2 (two) times daily.  Yes Historical Provider, MD  Lecithin 1200 MG CAPS Take 1,200 mg by mouth daily.   Yes Historical Provider, MD  lidocaine-hydrocortisone (LIDAZONE HC) 3-0.5 % CREA Place 1 Applicatorful rectally as needed (hemorrhoids).    Yes Historical Provider, MD  Lutein 20 MG TABS Take 20 mg by mouth daily.    Yes Historical Provider, MD  Multiple Vitamin (MULTIVITAMIN WITH MINERALS) TABS tablet Take 0.5 tablets by mouth 2 (two) times daily.    Yes Historical Provider, MD  Omega 3 1200 MG CAPS Take 1,200 mg by mouth 2 (two) times daily.   Yes Historical Provider, MD  simvastatin (ZOCOR) 20 MG tablet Take 20 mg by mouth at bedtime.    Yes Historical Provider, MD  triamcinolone cream (KENALOG) 0.1 % Apply 1 application topically 2 (two) times daily as needed (rash).    Yes Historical Provider, MD  vitamin C (ASCORBIC ACID) 500 MG tablet Take 500 mg by mouth daily.   Yes Historical Provider, MD  aspirin 325 MG EC tablet Take 1 tablet (325 mg total) by mouth 2 (two) times daily after a meal. Patient not taking: Reported on 02/19/2015 11/05/14   Pete Pelt, PA-C  oxyCODONE-acetaminophen (ROXICET) 5-325 MG tablet Take 1-2 tablets by mouth every 4 (four) hours as needed for  severe pain. Patient not taking: Reported on 02/19/2015 11/05/14   Pete Pelt, PA-C  predniSONE (DELTASONE) 20 MG tablet Take 1-2 tablets (20-40 mg total) by mouth daily. Take 2 tablets (40 mg) daily for 2 days, then take 1 tablet (20 mg) daily for 2 days, then stop 02/20/15 02/23/15  Addison Lank, MD   BP 161/63 mmHg  Pulse 70  Temp(Src) 98.1 F (36.7 C) (Oral)  Resp 20  SpO2 98%  LMP 01/14/2014 Physical Exam  Constitutional: She is oriented to person, place, and time. She appears well-developed and well-nourished. No distress.  HENT:  Head: Normocephalic and atraumatic.  Right Ear: External ear normal.  Left Ear: External ear normal.  Nose: Nose normal.  Eyes: Conjunctivae and EOM are normal. Pupils are equal, round, and reactive to light. Right eye exhibits no discharge. Left eye exhibits no discharge. No scleral icterus.  Neck: Normal range of motion. Neck supple.  Cardiovascular: Normal rate, regular rhythm and normal heart sounds.  Exam reveals no gallop and no friction rub.   No murmur heard. Pulmonary/Chest: Effort normal. No stridor. No respiratory distress. She has wheezes (with prolonged exp phase).  Abdominal: Soft. She exhibits no distension. There is no tenderness.  Musculoskeletal: She exhibits no edema or tenderness.  Neurological: She is alert and oriented to person, place, and time.  Skin: Skin is warm and dry. No rash noted. She is not diaphoretic. No erythema.  Psychiatric: She has a normal mood and affect.    ED Course  Procedures (including critical care time) Labs Review Labs Reviewed  CBC WITH DIFFERENTIAL/PLATELET - Abnormal; Notable for the following:    RBC 5.31 (*)    Neutro Abs 8.1 (*)    All other components within normal limits  COMPREHENSIVE METABOLIC PANEL - Abnormal; Notable for the following:    Glucose, Bld 180 (*)    All other components within normal limits    Imaging Review Dg Chest 2 View  02/19/2015  CLINICAL DATA:  Cough.   Wheezing and shortness of breath. EXAM: CHEST  2 VIEW COMPARISON:  09/01/2010 FINDINGS: Again noted is a surgical plate in the lower cervical spine. Lungs are clear without focal airspace disease or  pulmonary edema. Heart and mediastinum are within normal limits. The trachea is midline. No acute bone abnormality. Multilevel degenerative endplate changes in the thoracic spine. IMPRESSION: No active cardiopulmonary disease. Electronically Signed   By: Markus Daft M.D.   On: 02/19/2015 16:48   I have personally reviewed and evaluated these images and lab results as part of my medical decision-making.   EKG Interpretation None      MDM   65 year old female with a history of asthma presents to the ED with shortness of breath and cough that of an persistent over the past week. Rest of the history as above.  Chest x-ray without evidence of pneumonia. Labs unremarkable. Presentation most consistent with bronchitis with superimposed asthma exacerbation. Patient is already on short course of prednisone taper and is being followed by her PCP. She will require a DuoNeb here and additional steroids required.  Patient does not have a spacer at home so we'll provide her with one here after breathing treatment.  Patient responded well to the breathing treatment with improvement expiratory phase. She is safe for discharge with close follow-up with her PCP. Strict return precautions given. She was provided with additional ascription for prednisone taper.  All diagnostic studies were interpreted by me in used and my clinical decision-making.  Patient seen in conjunction with Dr. Darl Householder.  Final diagnoses:  Bronchitis  Asthma exacerbation        Addison Lank, MD 02/19/15 2111  Wandra Arthurs, MD 02/23/15 [phone]

## 2015-02-19 NOTE — ED Notes (Signed)
MD at bedside. 

## 2015-02-20 ENCOUNTER — Telehealth: Payer: Self-pay | Admitting: *Deleted

## 2015-02-20 NOTE — Telephone Encounter (Signed)
Pharmacist called to clarify instructions on Rx of Prednisone 20 mg Tablets. 1) take one tablet daily or 2) Two tablets daily x 2 days then One tablet Daily x 2 days  #5 tablets. Patient states the MD had instructed her to follow the 2nd direction and Pharmacist wanted to change the # of tablets to 6. No further CM needs.

## 2015-03-09 ENCOUNTER — Other Ambulatory Visit: Payer: Self-pay

## 2015-03-09 ENCOUNTER — Encounter: Payer: Self-pay | Admitting: Nurse Practitioner

## 2015-03-09 ENCOUNTER — Ambulatory Visit (INDEPENDENT_AMBULATORY_CARE_PROVIDER_SITE_OTHER): Payer: BC Managed Care – PPO | Admitting: Nurse Practitioner

## 2015-03-09 VITALS — BP 120/76 | HR 68 | Ht 62.5 in | Wt 246.0 lb

## 2015-03-09 DIAGNOSIS — Z01419 Encounter for gynecological examination (general) (routine) without abnormal findings: Secondary | ICD-10-CM | POA: Diagnosis not present

## 2015-03-09 DIAGNOSIS — Z1231 Encounter for screening mammogram for malignant neoplasm of breast: Secondary | ICD-10-CM

## 2015-03-09 DIAGNOSIS — M159 Polyosteoarthritis, unspecified: Secondary | ICD-10-CM

## 2015-03-09 DIAGNOSIS — Z0000 Encounter for general adult medical examination without abnormal findings: Secondary | ICD-10-CM | POA: Diagnosis not present

## 2015-03-09 MED ORDER — MEDROXYPROGESTERONE ACETATE 10 MG PO TABS: 10.0000 mg | ORAL_TABLET | Freq: Every day | ORAL | Status: DC

## 2015-03-09 NOTE — Progress Notes (Signed)
Patient ID: [name], female   DOB: [birth year], 65 y.o.   MRN: [id number]  65 y.o. [id number] Widowed  Caucasian Fe here for annual exam. She has undergone right hip replacement 09/08/14 and left hip 11/02/2014.  Still using cane for ambulation. Still doing Provera every 6 months.  Last time of Provera was October without any spotting or bleeding or cramps.  Due to take meds again in April.  Since last PUS was normal on 03/26/14 and endo biopsy was normal without hyperplasia - she was reduced to Provera every 6 months.  She wants to stop after this challenge in April.  Since husbands passing in 2015, her daughter help her a lot.  Patient's last menstrual period was 01/14/2014.          Sexually active: No.  The current method of family planning is abstinence.    Exercising: Yes.    walking, 2 new hips in 2016 Smoker:  no  Health Maintenance: Pap:03/06/14, Negative with neg HR HPV MMG:05/04/14, 3D, Bi-Rads 1: Negative, scheduled for 05/06/15 Colonoscopy: 08/2013, polyp, repeat in 5 years (Dr. Michail Sermon) BMD:02/21/06, T Score: 0.9 Spine / -0.7 Left Femur Neck TDaP: PCP Shingles: PCP Pneumonia: had in late 50's due to asthmas and bronchitis Hep C:  Will discuss with PCP Labs: PCP and ER   reports that she has never smoked. She has never used smokeless tobacco. She reports that she drinks alcohol. She reports that she does not use illicit drugs.  Past Medical History  Diagnosis Date  . Obesity   . Hypercholesteremia   . Hypertension since 1990's  . Abnormal Pap smear of cervix 03/2009    Colpo Biopsy CIN I with HPV  . Scoliosis   . Asthma   . Bronchitis   . Bilateral edema of lower extremity     Takes lasix if needed  . Arthritis   . Teeth grinding   . Lumbar herniated disc   . Spinal stenosis   . Disc disorder     bulging disc in thoracic area    Past Surgical History  Procedure Laterality Date  . Tonsillectomy and adenoidectomy    . Knee arthroscopy Right 1990  . Total knee  arthroplasty Right 2009  . Shoulder arthroscopy Left 1994    arthritis, hips,fingers  . Appendectomy  1965  . Lumbar disc surgery  2006  . Cervical disc surgery  2007  . Back surgery  1993    Thoracic and Lumbar  . Hernia repair  1977, as child    bilateral inguinal -1 during pregnancy.  Umbilical repair as child  . Colonoscopy w/ polypectomy    . Breast biopsy Right 1990    benign cyst  . Total hip arthroplasty Right 09/08/2014    Procedure: RIGHT TOTAL HIP ARTHROPLASTY ANTERIOR APPROACH;  Surgeon: Mcarthur Rossetti, MD;  Location: [facility];  Service: Orthopedics;  Laterality: Right;  . Total hip arthroplasty Left 11/02/2014    Procedure: LEFT TOTAL HIP ARTHROPLASTY ANTERIOR APPROACH;  Surgeon: Mcarthur Rossetti, MD;  Location: [facility];  Service: Orthopedics;  Laterality: Left;    Current Outpatient Prescriptions  Medication Sig Dispense Refill  . albuterol (ACCUNEB) 0.63 MG/3ML nebulizer solution     . albuterol (PROAIR HFA) 108 (90 Base) MCG/ACT inhaler Inhale 2 puffs into the lungs every 6 (six) hours as needed for wheezing or shortness of breath.    . budesonide (PULMICORT) 1 MG/2ML nebulizer solution     . chlorpheniramine-HYDROcodone (TUSSIONEX) 10-8 MG/5ML SUER Take  2.5 mLs by mouth at bedtime as needed for cough.     . cholecalciferol (VITAMIN D) 1000 UNITS tablet Take 1,000 Units by mouth 2 (two) times daily.    . Cinnamon 500 MG capsule Take 500 mg by mouth 2 (two) times daily.    . Coenzyme Q10 (COQ10) 200 MG CAPS Take 200 mg by mouth at bedtime.    . cyclobenzaprine (FLEXERIL) 10 MG tablet Take 10 mg by mouth 2 (two) times daily as needed for muscle spasms.     . diclofenac (VOLTAREN) 75 MG EC tablet Take 1 tablet (75 mg total) by mouth 2 (two) times daily as needed for mild pain. 60 tablet 1  . docusate sodium (COLACE) 100 MG capsule Take 200 mg by mouth at bedtime.    . felodipine (PLENDIL) 10 MG 24 hr tablet Take 10 mg by mouth daily.    Marland Kitchen FLAXSEED, LINSEED, PO Take  1,300 mg by mouth daily.     . furosemide (LASIX) 20 MG tablet Take 20-30 mg by mouth daily. Dose is dependent on amount of leg swelling    . Garlic 123XX123 MG CAPS Take 1,000 mg by mouth daily.    . Grape Seed 50 MG TABS Take 50 mg by mouth daily.    Marland Kitchen ipratropium-albuterol (DUONEB) 0.5-2.5 (3) MG/3ML SOLN Take 3 mLs by nebulization every 4 (four) hours.     . irbesartan (AVAPRO) 300 MG tablet Take 300 mg by mouth daily.    Marland Kitchen labetalol (NORMODYNE) 200 MG tablet Take 400 mg by mouth 2 (two) times daily.     . Lecithin 1200 MG CAPS Take 1,200 mg by mouth daily.    Marland Kitchen lidocaine-hydrocortisone (LIDAZONE HC) 3-0.5 % CREA Place 1 Applicatorful rectally as needed (hemorrhoids).     . Lutein 20 MG TABS Take 20 mg by mouth daily.     . medroxyPROGESTERone (PROVERA) 10 MG tablet Take 1 tablet (10 mg total) by mouth daily. 10 tablet 0  . Multiple Vitamin (MULTIVITAMIN WITH MINERALS) TABS tablet Take 0.5 tablets by mouth 2 (two) times daily.     . Omega 3 1200 MG CAPS Take 1,200 mg by mouth 2 (two) times daily.    . simvastatin (ZOCOR) 20 MG tablet Take 20 mg by mouth at bedtime.     . triamcinolone cream (KENALOG) 0.1 % Apply 1 application topically 2 (two) times daily as needed (rash).     . vitamin C (ASCORBIC ACID) 500 MG tablet Take 500 mg by mouth daily.     No current facility-administered medications for this visit.    Family History  Problem Relation Age of Onset  . Breast cancer Sister 13    bilateral mastectomy- Breast cancer   . Hypertension Mother   . Heart disease Mother   . Stroke Mother   . Heart disease Father   . Depression Father   . Alcohol abuse Father   . Stroke Maternal Grandmother   . Depression Daughter   . Emphysema Paternal Grandmother   . Breast cancer Maternal Aunt     Both aunts in 22 -16's  . Heart failure Paternal Grandfather   . Alcohol abuse Paternal Grandfather     ROS:  Pertinent items are noted in HPI.  Otherwise, a comprehensive ROS was negative.  Exam:    BP 120/76 mmHg  Pulse 68  Ht 5' 2.5" (1.588 m)  Wt 246 lb (111.585 kg)  BMI 44.25 kg/m2  LMP 01/14/2014 Height: 5' 2.5" (158.8 cm) Ht  Readings from Last 3 Encounters:  03/09/15 5' 2.5" (1.588 m)  10/22/14 5\' 2"  (1.575 m)  09/17/14 5\' 2"  (1.575 m)    General appearance: alert, cooperative and appears stated age Head: Normocephalic, without obvious abnormality, atraumatic Neck: no adenopathy, supple, symmetrical, trachea midline and thyroid normal to inspection and palpation Lungs: clear to auscultation bilaterally Breasts: normal appearance, no masses or tenderness Heart: regular rate and rhythm Abdomen: soft, non-tender; no masses,  no organomegaly Extremities: extremities normal, atraumatic, no cyanosis or edema Skin: Skin color, texture, turgor normal. No rashes or lesions Lymph nodes: Cervical, supraclavicular, and axillary nodes normal. No abnormal inguinal nodes palpated Neurologic: Grossly normal   Pelvic: External genitalia:  no lesions              Urethra:  normal appearing urethra with no masses, tenderness or lesions              Bartholin's and Skene's: normal                 Vagina: normal appearing vagina with normal color and discharge, no lesions              Cervix: anteverted              Pap taken: No. Bimanual Exam:  Uterus:  normal size, contour, position, consistency, mobility, non-tender              Adnexa: no mass, fullness, tenderness               Rectovaginal: Confirms               Anus:  normal sphincter tone, no lesions  Chaperone present: no  A:  Well Woman with normal exam  Postmenopausal At risk for endo hyperplasia and on Provera 10 mg every 6 months at this time Previous endo biopsy 10/28/2007 fragmented Proliferative endo             Last endo biopsy 03/26/14   Endometrium, biopsy, routine   - ABUNDANT MUCUS AND SCANT ATROPHIC ENDOMETRIAL GLANDS.   - NO ATYPIA OR MALIGNANCY. Morbid obesity  OA  multiple joints, multiple back surgery  S/P bilateral hip replacements 2016  S/P right knee replacement 2009  History of asthma      P:   Reviewed health and wellness pertinent to exam  Pap smear as above  Mammogram is due 05/2015  Refill on Provera 10 mg X 10 days and to call if any vaginal bleeding or spotting  She desires to stop Provera after this next challenge - that will be 2 doses at 6 months without bleeding.  Counseled on breast self exam, mammography screening, adequate intake of calcium and vitamin D, diet and exercise return annually or prn  An After Visit Summary was printed and given to the patient.

## 2015-03-09 NOTE — Patient Instructions (Addendum)

## 2015-03-11 NOTE — Progress Notes (Signed)
Encounter reviewed by Dr. Aundria Rud.  Ok to stop Provera challenges if patient does not have bleeding after the next course of Provera in April.

## 2015-03-15 ENCOUNTER — Telehealth: Payer: Self-pay

## 2015-03-15 NOTE — Telephone Encounter (Signed)
Kem Boroughs, FNP  Jasmine Awe, RN           Would you please call pt and relay to her this information about the Provera. Thanks       Previous Messages     ----- Message -----   From: Nunzio Cobbs, MD   Sent: 03/11/2015  8:09 AM    To: Kem Boroughs, FNP   Ok to stop Provera if no bleeding with the April Provera challenge.   Patient will need to report any future spotting or bleeding.   Thanks,   [place]  ----- Message -----   From: Kem Boroughs, FNP   Sent: 03/09/2015  5:42 PM    To: Nunzio Cobbs, MD   Please review since Dr. Sabra Heck is not here for me to ask - OK to stop Provera after the challenge in April if no bleeding??       Spoke with patient. Advised of message as seen above from [place]. She is agreeable and verbalizes understanding.  Routing to provider for final review. Patient agreeable to disposition. Will close encounter.

## 2015-05-06 ENCOUNTER — Ambulatory Visit
Admission: RE | Admit: 2015-05-06 | Discharge: 2015-05-06 | Disposition: A | Discharge status: Home / Self Care | Payer: BC Managed Care – PPO | Source: Ambulatory Visit

## 2015-05-06 DIAGNOSIS — Z1231 Encounter for screening mammogram for malignant neoplasm of breast: Secondary | ICD-10-CM

## 2015-07-12 DIAGNOSIS — M7542 Impingement syndrome of left shoulder: Secondary | ICD-10-CM | POA: Diagnosis not present

## 2015-08-02 ENCOUNTER — Encounter (HOSPITAL_COMMUNITY): Payer: Self-pay

## 2015-08-02 ENCOUNTER — Other Ambulatory Visit: Payer: Self-pay | Admitting: Physician Assistant

## 2015-08-03 ENCOUNTER — Encounter (HOSPITAL_COMMUNITY): Payer: Self-pay

## 2015-08-03 ENCOUNTER — Other Ambulatory Visit: Payer: Self-pay

## 2015-08-03 ENCOUNTER — Encounter (HOSPITAL_COMMUNITY)
Admission: RE | Admit: 2015-08-03 | Discharge: 2015-08-03 | Disposition: A | Discharge status: Home / Self Care | Payer: Medicare Other | Source: Ambulatory Visit | Attending: Orthopaedic Surgery | Admitting: Orthopaedic Surgery

## 2015-08-03 DIAGNOSIS — Z01812 Encounter for preprocedural laboratory examination: Secondary | ICD-10-CM | POA: Diagnosis not present

## 2015-08-03 DIAGNOSIS — I10 Essential (primary) hypertension: Secondary | ICD-10-CM | POA: Insufficient documentation

## 2015-08-03 DIAGNOSIS — Z01818 Encounter for other preprocedural examination: Secondary | ICD-10-CM | POA: Insufficient documentation

## 2015-08-03 DIAGNOSIS — J45909 Unspecified asthma, uncomplicated: Secondary | ICD-10-CM | POA: Diagnosis not present

## 2015-08-03 DIAGNOSIS — Z7951 Long term (current) use of inhaled steroids: Secondary | ICD-10-CM | POA: Insufficient documentation

## 2015-08-03 DIAGNOSIS — Z96643 Presence of artificial hip joint, bilateral: Secondary | ICD-10-CM | POA: Diagnosis not present

## 2015-08-03 DIAGNOSIS — M1712 Unilateral primary osteoarthritis, left knee: Secondary | ICD-10-CM | POA: Insufficient documentation

## 2015-08-03 DIAGNOSIS — Z79899 Other long term (current) drug therapy: Secondary | ICD-10-CM | POA: Diagnosis not present

## 2015-08-03 DIAGNOSIS — E7800 Pure hypercholesterolemia, unspecified: Secondary | ICD-10-CM | POA: Diagnosis not present

## 2015-08-03 DIAGNOSIS — Z6841 Body Mass Index (BMI) 40.0 and over, adult: Secondary | ICD-10-CM | POA: Insufficient documentation

## 2015-08-03 HISTORY — DX: Pneumonia, unspecified organism: J18.9

## 2015-08-03 HISTORY — DX: Gastro-esophageal reflux disease without esophagitis: K21.9

## 2015-08-03 LAB — CBC
HCT: 43 % (ref 36.0–46.0)
Hemoglobin: 13.6 g/dL (ref 12.0–15.0)
MCH: 28 pg (ref 26.0–34.0)
MCHC: 31.6 g/dL (ref 30.0–36.0)
MCV: 88.5 fL (ref 78.0–100.0)
PLATELETS: 210 10*3/uL (ref 150–400)
RBC: 4.86 MIL/uL (ref 3.87–5.11)
RDW: 13.6 % (ref 11.5–15.5)
WBC: 7.7 10*3/uL (ref 4.0–10.5)

## 2015-08-03 LAB — BASIC METABOLIC PANEL
ANION GAP: 6 (ref 5–15)
BUN: 12 mg/dL (ref 6–20)
CALCIUM: 9.2 mg/dL (ref 8.9–10.3)
CO2: 27 mmol/L (ref 22–32)
Chloride: 108 mmol/L (ref 101–111)
Creatinine, Ser: 0.61 mg/dL (ref 0.44–1.00)
GLUCOSE: 92 mg/dL (ref 65–99)
POTASSIUM: 4 mmol/L (ref 3.5–5.1)
SODIUM: 141 mmol/L (ref 135–145)

## 2015-08-03 LAB — SURGICAL PCR SCREEN
MRSA, PCR: NEGATIVE
STAPHYLOCOCCUS AUREUS: NEGATIVE

## 2015-08-03 NOTE — Pre-Procedure Instructions (Addendum)
[name]  08/03/2015      [facility] [id number] - [address], [address] [place] [address] Phone: [phone] Fax: [place], [place] [address] Phone: [phone] Fax: [phone]    Your procedure is scheduled on 08/10/15.  Report to [facility] Admitting at 1000 A.M.  Call this number if you have problems the morning of surgery:  [phone]   Remember:  Do not eat food or drink liquids after midnight.  Take these medicines the morning of surgery with A SIP OF WATER nebulizers/inhalers if needed-bring albuterol inhaler, felodipine(plendil),labetalol  STOP all herbel meds, nsaids (aleve,naproxen,advil,ibuprofen) 5 days prior to surgery starting 08/05/15 including all vitamins(D,C,luetin,lecithin,grape seed, garlic,cinnamon) omega 3, diclofenac(voltaren),coq10   Do not wear jewelry, make-up or nail polish.  Do not wear lotions, powders, or perfumes.  You may wear deoderant.  Do not shave 48 hours prior to surgery.  Men may shave face and neck.  Do not bring valuables to the hospital.  [facility] is not responsible for any belongings or valuables.  Contacts, dentures or bridgework may not be worn into surgery.  Leave your suitcase in the car.  After surgery it may be brought to your room.  For patients admitted to the hospital, discharge time will be determined by your treatment team.  Patients discharged the day of surgery will not be allowed to drive home.   Name and phone number of your driver:    Special instructions:   Special Instructions: [facility] - Preparing for Surgery  Before surgery, you can play an important role.  Because skin is not sterile, your skin needs to be as free of germs as possible.  You can reduce the number of germs on you skin by washing with CHG  (chlorahexidine gluconate) soap before surgery.  CHG is an antiseptic cleaner which kills germs and bonds with the skin to continue killing germs even after washing.  Please DO NOT use if you have an allergy to CHG or antibacterial soaps.  If your skin becomes reddened/irritated stop using the CHG and inform your nurse when you arrive at Short Stay.  Do not shave (including legs and underarms) for at least 48 hours prior to the first CHG shower.  You may shave your face.  Please follow these instructions carefully:   1.  Shower with CHG Soap the night before surgery and the morning of Surgery.  2.  If you choose to wash your hair, wash your hair first as usual with your normal shampoo.  3.  After you shampoo, rinse your hair and body thoroughly to remove the Shampoo.  4.  Use CHG as you would any other liquid soap.  You can apply chg directly  to the skin and wash gently with scrungie or a clean washcloth.  5.  Apply the CHG Soap to your body ONLY FROM THE NECK DOWN.  Do not use on open wounds or open sores.  Avoid contact with your eyes ears, mouth and genitals (private parts).  Wash genitals (private parts)       with your normal soap.  6.  Wash thoroughly, paying special attention to the area where your surgery will be performed.  7.  Thoroughly rinse your body with warm water from the neck down.  8.  DO  NOT shower/wash with your normal soap after using and rinsing off the CHG Soap.  9.  Pat yourself dry with a clean towel.            10.  Wear clean pajamas.            11.  Place clean sheets on your bed the night of your first shower and do not sleep with pets.  Day of Surgery  Do not apply any lotions/deodorants the morning of surgery.  Please wear clean clothes to the hospital/surgery center.  Please read over the following fact sheets that you were given. Pain Booklet

## 2015-08-09 MED ORDER — TRANEXAMIC ACID 1000 MG/10ML IV SOLN
1000.0000 mg | INTRAVENOUS | Status: AC
  Administered 2015-08-10: 1000 mg via INTRAVENOUS
  Filled 2015-08-09: qty 10

## 2015-08-10 ENCOUNTER — Inpatient Hospital Stay (HOSPITAL_COMMUNITY): Payer: Medicare Other | Admitting: Certified Registered"

## 2015-08-10 ENCOUNTER — Encounter (HOSPITAL_COMMUNITY)
Admission: RE | Disposition: A | Discharge status: Skilled Nursing Facility | Payer: Self-pay | Source: Ambulatory Visit | Attending: Orthopaedic Surgery

## 2015-08-10 ENCOUNTER — Inpatient Hospital Stay (HOSPITAL_COMMUNITY): Payer: Medicare Other

## 2015-08-10 ENCOUNTER — Inpatient Hospital Stay (HOSPITAL_COMMUNITY)
Admission: RE | Admit: 2015-08-10 | Discharge: 2015-08-13 | DRG: 470 | Disposition: A | Discharge status: Skilled Nursing Facility | Payer: Medicare Other | Source: Ambulatory Visit | Attending: Orthopaedic Surgery | Admitting: Orthopaedic Surgery

## 2015-08-10 ENCOUNTER — Encounter (HOSPITAL_COMMUNITY): Payer: Self-pay | Admitting: Certified Registered"

## 2015-08-10 DIAGNOSIS — M419 Scoliosis, unspecified: Secondary | ICD-10-CM | POA: Diagnosis present

## 2015-08-10 DIAGNOSIS — M179 Osteoarthritis of knee, unspecified: Secondary | ICD-10-CM | POA: Diagnosis not present

## 2015-08-10 DIAGNOSIS — Z471 Aftercare following joint replacement surgery: Secondary | ICD-10-CM | POA: Diagnosis not present

## 2015-08-10 DIAGNOSIS — Z8249 Family history of ischemic heart disease and other diseases of the circulatory system: Secondary | ICD-10-CM | POA: Diagnosis not present

## 2015-08-10 DIAGNOSIS — Z885 Allergy status to narcotic agent status: Secondary | ICD-10-CM

## 2015-08-10 DIAGNOSIS — Z6841 Body Mass Index (BMI) 40.0 and over, adult: Secondary | ICD-10-CM

## 2015-08-10 DIAGNOSIS — Z96652 Presence of left artificial knee joint: Secondary | ICD-10-CM

## 2015-08-10 DIAGNOSIS — J45909 Unspecified asthma, uncomplicated: Secondary | ICD-10-CM | POA: Diagnosis present

## 2015-08-10 DIAGNOSIS — Z888 Allergy status to other drugs, medicaments and biological substances status: Secondary | ICD-10-CM | POA: Diagnosis not present

## 2015-08-10 DIAGNOSIS — I10 Essential (primary) hypertension: Secondary | ICD-10-CM | POA: Diagnosis present

## 2015-08-10 DIAGNOSIS — E7800 Pure hypercholesterolemia, unspecified: Secondary | ICD-10-CM | POA: Diagnosis present

## 2015-08-10 DIAGNOSIS — N950 Postmenopausal bleeding: Secondary | ICD-10-CM | POA: Diagnosis not present

## 2015-08-10 DIAGNOSIS — Z96643 Presence of artificial hip joint, bilateral: Secondary | ICD-10-CM | POA: Diagnosis present

## 2015-08-10 DIAGNOSIS — M1712 Unilateral primary osteoarthritis, left knee: Secondary | ICD-10-CM | POA: Diagnosis present

## 2015-08-10 DIAGNOSIS — M5126 Other intervertebral disc displacement, lumbar region: Secondary | ICD-10-CM | POA: Diagnosis present

## 2015-08-10 DIAGNOSIS — G8918 Other acute postprocedural pain: Secondary | ICD-10-CM | POA: Diagnosis not present

## 2015-08-10 DIAGNOSIS — K219 Gastro-esophageal reflux disease without esophagitis: Secondary | ICD-10-CM | POA: Diagnosis present

## 2015-08-10 DIAGNOSIS — M1990 Unspecified osteoarthritis, unspecified site: Secondary | ICD-10-CM | POA: Diagnosis not present

## 2015-08-10 DIAGNOSIS — M4800 Spinal stenosis, site unspecified: Secondary | ICD-10-CM | POA: Diagnosis present

## 2015-08-10 DIAGNOSIS — Z79899 Other long term (current) drug therapy: Secondary | ICD-10-CM

## 2015-08-10 HISTORY — PX: TOTAL KNEE ARTHROPLASTY: SHX125

## 2015-08-10 SURGERY — ARTHROPLASTY, KNEE, TOTAL
Anesthesia: Regional | Laterality: Left

## 2015-08-10 MED ORDER — FENTANYL CITRATE (PF) 250 MCG/5ML IJ SOLN
INTRAMUSCULAR | Status: DC | PRN
  Administered 2015-08-10: 150 ug via INTRAVENOUS
  Administered 2015-08-10 (×5): 50 ug via INTRAVENOUS

## 2015-08-10 MED ORDER — MIDAZOLAM HCL 5 MG/5ML IJ SOLN
INTRAMUSCULAR | Status: DC | PRN
  Administered 2015-08-10: 2 mg via INTRAVENOUS

## 2015-08-10 MED ORDER — HYDROMORPHONE HCL 1 MG/ML IJ SOLN
INTRAMUSCULAR | Status: AC
  Filled 2015-08-10: qty 2

## 2015-08-10 MED ORDER — COQ10 200 MG PO CAPS: 200.0000 mg | ORAL_CAPSULE | Freq: Every day | ORAL | Status: DC

## 2015-08-10 MED ORDER — SODIUM CHLORIDE 0.9 % IJ SOLN: INTRAMUSCULAR | Status: DC | PRN

## 2015-08-10 MED ORDER — METOCLOPRAMIDE HCL 5 MG/ML IJ SOLN: 5.0000 mg | Freq: Three times a day (TID) | INTRAMUSCULAR | Status: DC | PRN

## 2015-08-10 MED ORDER — FELODIPINE ER 10 MG PO TB24
10.0000 mg | ORAL_TABLET | Freq: Every day | ORAL | Status: DC
  Administered 2015-08-11 – 2015-08-13 (×3): 10 mg via ORAL
  Filled 2015-08-10 (×4): qty 1

## 2015-08-10 MED ORDER — ZOLPIDEM TARTRATE 5 MG PO TABS: 5.0000 mg | ORAL_TABLET | Freq: Every evening | ORAL | Status: DC | PRN

## 2015-08-10 MED ORDER — METHOCARBAMOL 500 MG PO TABS
500.0000 mg | ORAL_TABLET | Freq: Four times a day (QID) | ORAL | Status: DC | PRN
  Administered 2015-08-11 – 2015-08-12 (×3): 500 mg via ORAL
  Filled 2015-08-10 (×5): qty 1

## 2015-08-10 MED ORDER — BUDESONIDE 0.5 MG/2ML IN SUSP: 1.0000 mg | Freq: Every day | RESPIRATORY_TRACT | Status: DC | PRN

## 2015-08-10 MED ORDER — SORBITOL 70 % SOLN: 30.0000 mL | Freq: Every day | Status: DC | PRN

## 2015-08-10 MED ORDER — SIMVASTATIN 20 MG PO TABS
20.0000 mg | ORAL_TABLET | Freq: Every day | ORAL | Status: DC
  Administered 2015-08-10 – 2015-08-12 (×3): 20 mg via ORAL
  Filled 2015-08-10 (×3): qty 1

## 2015-08-10 MED ORDER — VITAMIN C 500 MG PO TABS
500.0000 mg | ORAL_TABLET | Freq: Every day | ORAL | Status: DC
  Administered 2015-08-10 – 2015-08-13 (×4): 500 mg via ORAL
  Filled 2015-08-10 (×4): qty 1

## 2015-08-10 MED ORDER — FUROSEMIDE 20 MG PO TABS: 20.0000 mg | ORAL_TABLET | Freq: Every day | ORAL | Status: DC | PRN

## 2015-08-10 MED ORDER — CHLORHEXIDINE GLUCONATE 4 % EX LIQD: 60.0000 mL | Freq: Once | CUTANEOUS | Status: DC

## 2015-08-10 MED ORDER — FENTANYL CITRATE (PF) 250 MCG/5ML IJ SOLN
INTRAMUSCULAR | Status: AC
  Filled 2015-08-10: qty 5

## 2015-08-10 MED ORDER — MIDAZOLAM HCL 2 MG/2ML IJ SOLN
INTRAMUSCULAR | Status: AC
  Filled 2015-08-10: qty 2

## 2015-08-10 MED ORDER — ACETAMINOPHEN 325 MG PO TABS: 325.0000 mg | ORAL_TABLET | ORAL | Status: DC | PRN

## 2015-08-10 MED ORDER — POLYETHYLENE GLYCOL 3350 17 G PO PACK: 17.0000 g | PACK | Freq: Every day | ORAL | Status: DC | PRN

## 2015-08-10 MED ORDER — OXYCODONE HCL 5 MG PO TABS: 5.0000 mg | ORAL_TABLET | Freq: Once | ORAL | Status: DC | PRN

## 2015-08-10 MED ORDER — MENTHOL 3 MG MT LOZG: 1.0000 | LOZENGE | OROMUCOSAL | Status: DC | PRN

## 2015-08-10 MED ORDER — DOCUSATE SODIUM 100 MG PO CAPS
100.0000 mg | ORAL_CAPSULE | Freq: Two times a day (BID) | ORAL | Status: DC
  Administered 2015-08-10 – 2015-08-13 (×6): 100 mg via ORAL
  Filled 2015-08-10 (×6): qty 1

## 2015-08-10 MED ORDER — ACETAMINOPHEN 160 MG/5ML PO SOLN
325.0000 mg | ORAL | Status: DC | PRN
  Filled 2015-08-10: qty 20.3

## 2015-08-10 MED ORDER — LABETALOL HCL 200 MG PO TABS
400.0000 mg | ORAL_TABLET | Freq: Two times a day (BID) | ORAL | Status: DC
  Administered 2015-08-10 – 2015-08-13 (×6): 400 mg via ORAL
  Filled 2015-08-10 (×6): qty 2

## 2015-08-10 MED ORDER — BACID PO TABS
2.0000 | ORAL_TABLET | Freq: Every morning | ORAL | Status: DC
Start: 2015-08-11 — End: 2015-08-13
  Administered 2015-08-11 – 2015-08-13 (×3): 2 via ORAL
  Filled 2015-08-10 (×3): qty 2

## 2015-08-10 MED ORDER — CEFAZOLIN SODIUM-DEXTROSE 2-4 GM/100ML-% IV SOLN
2.0000 g | Freq: Four times a day (QID) | INTRAVENOUS | Status: AC
Start: 2015-08-10 — End: 2015-08-11
  Administered 2015-08-10 – 2015-08-11 (×2): 2 g via INTRAVENOUS
  Filled 2015-08-10 (×2): qty 100

## 2015-08-10 MED ORDER — ASPIRIN EC 325 MG PO TBEC
325.0000 mg | DELAYED_RELEASE_TABLET | Freq: Two times a day (BID) | ORAL | Status: DC
  Administered 2015-08-10 – 2015-08-13 (×6): 325 mg via ORAL
  Filled 2015-08-10 (×6): qty 1

## 2015-08-10 MED ORDER — OXYCODONE HCL 5 MG PO TABS
ORAL_TABLET | ORAL | Status: AC
  Filled 2015-08-10: qty 2

## 2015-08-10 MED ORDER — BUPIVACAINE LIPOSOME 1.3 % IJ SUSP
20.0000 mL | Freq: Once | INTRAMUSCULAR | Status: DC
  Filled 2015-08-10: qty 20

## 2015-08-10 MED ORDER — BUPIVACAINE HCL (PF) 0.25 % IJ SOLN
INTRAMUSCULAR | Status: AC
  Filled 2015-08-10: qty 30

## 2015-08-10 MED ORDER — ACETAMINOPHEN 325 MG PO TABS
650.0000 mg | ORAL_TABLET | Freq: Four times a day (QID) | ORAL | Status: DC | PRN
  Administered 2015-08-11 – 2015-08-13 (×6): 650 mg via ORAL
  Filled 2015-08-10 (×6): qty 2

## 2015-08-10 MED ORDER — BUPIVACAINE HCL (PF) 0.25 % IJ SOLN: INTRAMUSCULAR | Status: DC | PRN

## 2015-08-10 MED ORDER — PHENOL 1.4 % MT LIQD: 1.0000 | OROMUCOSAL | Status: DC | PRN

## 2015-08-10 MED ORDER — OXYCODONE HCL 5 MG PO TABS
5.0000 mg | ORAL_TABLET | ORAL | Status: DC | PRN
  Administered 2015-08-10 (×2): 10 mg via ORAL
  Filled 2015-08-10: qty 2

## 2015-08-10 MED ORDER — METHOCARBAMOL 500 MG PO TABS
ORAL_TABLET | ORAL | Status: AC
  Filled 2015-08-10: qty 1

## 2015-08-10 MED ORDER — LACTATED RINGERS IV SOLN
INTRAVENOUS | Status: DC | PRN
  Administered 2015-08-10 (×2): via INTRAVENOUS

## 2015-08-10 MED ORDER — LIDOCAINE 2% (20 MG/ML) 5 ML SYRINGE
INTRAMUSCULAR | Status: DC | PRN
  Administered 2015-08-10: 60 mg via INTRAVENOUS

## 2015-08-10 MED ORDER — DIPHENHYDRAMINE HCL 12.5 MG/5ML PO ELIX: 12.5000 mg | ORAL_SOLUTION | ORAL | Status: DC | PRN

## 2015-08-10 MED ORDER — ALBUTEROL SULFATE HFA 108 (90 BASE) MCG/ACT IN AERS: 2.0000 | INHALATION_SPRAY | Freq: Four times a day (QID) | RESPIRATORY_TRACT | Status: DC | PRN

## 2015-08-10 MED ORDER — METHOCARBAMOL 1000 MG/10ML IJ SOLN
500.0000 mg | Freq: Four times a day (QID) | INTRAMUSCULAR | Status: DC | PRN
  Filled 2015-08-10: qty 5

## 2015-08-10 MED ORDER — SODIUM CHLORIDE 0.9 % IR SOLN
Status: DC | PRN
  Administered 2015-08-10: 3000 mL

## 2015-08-10 MED ORDER — OXYCODONE HCL 5 MG/5ML PO SOLN: 5.0000 mg | Freq: Once | ORAL | Status: DC | PRN

## 2015-08-10 MED ORDER — PROPOFOL 10 MG/ML IV BOLUS
INTRAVENOUS | Status: DC | PRN
  Administered 2015-08-10: 160 mg via INTRAVENOUS

## 2015-08-10 MED ORDER — ALBUTEROL SULFATE (2.5 MG/3ML) 0.083% IN NEBU: 2.5000 mg | INHALATION_SOLUTION | Freq: Four times a day (QID) | RESPIRATORY_TRACT | Status: DC | PRN

## 2015-08-10 MED ORDER — FENTANYL CITRATE (PF) 250 MCG/5ML IJ SOLN
INTRAMUSCULAR | Status: AC
Start: 2015-08-10 — End: 2015-08-10
  Filled 2015-08-10: qty 5

## 2015-08-10 MED ORDER — VITAMIN D 1000 UNITS PO TABS
1000.0000 [IU] | ORAL_TABLET | Freq: Two times a day (BID) | ORAL | Status: DC
  Administered 2015-08-10 – 2015-08-13 (×6): 1000 [IU] via ORAL
  Filled 2015-08-10 (×6): qty 1

## 2015-08-10 MED ORDER — LUTEIN 20 MG PO TABS: 20.0000 mg | ORAL_TABLET | Freq: Every day | ORAL | Status: DC

## 2015-08-10 MED ORDER — HYDROMORPHONE HCL 1 MG/ML IJ SOLN: 1.0000 mg | INTRAMUSCULAR | Status: DC | PRN

## 2015-08-10 MED ORDER — METOCLOPRAMIDE HCL 5 MG PO TABS: 5.0000 mg | ORAL_TABLET | Freq: Three times a day (TID) | ORAL | Status: DC | PRN

## 2015-08-10 MED ORDER — IPRATROPIUM-ALBUTEROL 0.5-2.5 (3) MG/3ML IN SOLN: 3.0000 mL | Freq: Four times a day (QID) | RESPIRATORY_TRACT | Status: DC | PRN

## 2015-08-10 MED ORDER — BUPIVACAINE LIPOSOME 1.3 % IJ SUSP
INTRAMUSCULAR | Status: DC | PRN
Start: 2015-08-10 — End: 2015-08-10

## 2015-08-10 MED ORDER — ACETAMINOPHEN 650 MG RE SUPP: 650.0000 mg | Freq: Four times a day (QID) | RECTAL | Status: DC | PRN

## 2015-08-10 MED ORDER — PROPOFOL 10 MG/ML IV BOLUS
INTRAVENOUS | Status: AC
  Filled 2015-08-10: qty 20

## 2015-08-10 MED ORDER — FENTANYL CITRATE (PF) 100 MCG/2ML IJ SOLN
INTRAMUSCULAR | Status: DC
Start: 2015-08-10 — End: 2015-08-10
  Filled 2015-08-10: qty 2

## 2015-08-10 MED ORDER — LECITHIN 1200 MG PO CAPS: 1200.0000 mg | ORAL_CAPSULE | Freq: Every day | ORAL | Status: DC

## 2015-08-10 MED ORDER — 0.9 % SODIUM CHLORIDE (POUR BTL) OPTIME
TOPICAL | Status: DC | PRN
  Administered 2015-08-10: 1000 mL

## 2015-08-10 MED ORDER — ADULT MULTIVITAMIN W/MINERALS CH
0.5000 | ORAL_TABLET | Freq: Two times a day (BID) | ORAL | Status: DC
  Administered 2015-08-10 – 2015-08-13 (×6): 0.5 via ORAL
  Filled 2015-08-10 (×6): qty 1

## 2015-08-10 MED ORDER — SODIUM CHLORIDE 0.9 % IV SOLN
INTRAVENOUS | Status: DC
  Administered 2015-08-10: 18:00:00 via INTRAVENOUS

## 2015-08-10 MED ORDER — ONDANSETRON HCL 4 MG/2ML IJ SOLN
INTRAMUSCULAR | Status: DC | PRN
  Administered 2015-08-10: 4 mg via INTRAVENOUS

## 2015-08-10 MED ORDER — ONDANSETRON HCL 4 MG PO TABS: 4.0000 mg | ORAL_TABLET | Freq: Four times a day (QID) | ORAL | Status: DC | PRN

## 2015-08-10 MED ORDER — HYDROMORPHONE HCL 1 MG/ML IJ SOLN
0.2500 mg | INTRAMUSCULAR | Status: DC | PRN
  Administered 2015-08-10 (×3): 0.5 mg via INTRAVENOUS

## 2015-08-10 MED ORDER — IRBESARTAN 300 MG PO TABS
300.0000 mg | ORAL_TABLET | Freq: Every day | ORAL | Status: DC
  Administered 2015-08-10 – 2015-08-13 (×4): 300 mg via ORAL
  Filled 2015-08-10 (×4): qty 1

## 2015-08-10 MED ORDER — CEFAZOLIN SODIUM-DEXTROSE 2-4 GM/100ML-% IV SOLN
INTRAVENOUS | Status: AC
  Filled 2015-08-10: qty 100

## 2015-08-10 MED ORDER — CEFAZOLIN SODIUM-DEXTROSE 2-4 GM/100ML-% IV SOLN
2.0000 g | INTRAVENOUS | Status: AC
  Administered 2015-08-10: 2 g via INTRAVENOUS

## 2015-08-10 MED ORDER — ONDANSETRON HCL 4 MG/2ML IJ SOLN: 4.0000 mg | Freq: Four times a day (QID) | INTRAMUSCULAR | Status: DC | PRN

## 2015-08-10 SURGICAL SUPPLY — 65 items
BANDAGE ELASTIC 6 VELCRO ST LF (GAUZE/BANDAGES/DRESSINGS) ×3 IMPLANT
BANDAGE ESMARK 6X9 LF (GAUZE/BANDAGES/DRESSINGS) ×1 IMPLANT
BLADE SAG 18X100X1.27 (BLADE) ×2 IMPLANT
BNDG CMPR 9X6 STRL LF SNTH (GAUZE/BANDAGES/DRESSINGS) ×1
BNDG COHESIVE 4X5 TAN STRL (GAUZE/BANDAGES/DRESSINGS) ×1 IMPLANT
BNDG ESMARK 6X9 LF (GAUZE/BANDAGES/DRESSINGS) ×2
BOWL SMART MIX CTS (DISPOSABLE) ×2 IMPLANT
CAPT KNEE TOTAL 3 ×1 IMPLANT
CEMENT BONE SIMPLEX SPEEDSET (Cement) ×2 IMPLANT
CLSR STERI-STRIP ANTIMIC 1/2X4 (GAUZE/BANDAGES/DRESSINGS) ×1 IMPLANT
COVER SURGICAL LIGHT HANDLE (MISCELLANEOUS) ×2 IMPLANT
CUFF TOURNIQUET SINGLE 34IN LL (TOURNIQUET CUFF) ×2 IMPLANT
CUFF TOURNIQUET SINGLE 44IN (TOURNIQUET CUFF) IMPLANT
DRAPE EXTREMITY T 121X128X90 (DRAPE) ×2 IMPLANT
DRAPE INCISE IOBAN 66X45 STRL (DRAPES) IMPLANT
DRAPE PROXIMA HALF (DRAPES) ×2 IMPLANT
DRAPE U-SHAPE 47X51 STRL (DRAPES) ×2 IMPLANT
DRSG PAD ABDOMINAL 8X10 ST (GAUZE/BANDAGES/DRESSINGS) ×2 IMPLANT
DURAPREP 26ML APPLICATOR (WOUND CARE) ×3 IMPLANT
ELECT CAUTERY BLADE 6.4 (BLADE) ×2 IMPLANT
ELECT REM PT RETURN 9FT ADLT (ELECTROSURGICAL) ×2
ELECTRODE REM PT RTRN 9FT ADLT (ELECTROSURGICAL) ×1 IMPLANT
EVACUATOR 1/8 PVC DRAIN (DRAIN) IMPLANT
FACESHIELD WRAPAROUND (MASK) ×6 IMPLANT
FACESHIELD WRAPAROUND OR TEAM (MASK) ×3 IMPLANT
GAUZE SPONGE 4X4 12PLY STRL (GAUZE/BANDAGES/DRESSINGS) ×2 IMPLANT
GAUZE XEROFORM 1X8 LF (GAUZE/BANDAGES/DRESSINGS) ×2 IMPLANT
GLOVE BIOGEL PI IND STRL 8 (GLOVE) ×2 IMPLANT
GLOVE BIOGEL PI INDICATOR 8 (GLOVE) ×2
GLOVE ORTHO TXT STRL SZ7.5 (GLOVE) ×2 IMPLANT
GLOVE SURG ORTHO 8.0 STRL STRW (GLOVE) ×2 IMPLANT
GOWN STRL REUS W/ TWL LRG LVL3 (GOWN DISPOSABLE) ×2 IMPLANT
GOWN STRL REUS W/ TWL XL LVL3 (GOWN DISPOSABLE) ×2 IMPLANT
GOWN STRL REUS W/TWL LRG LVL3 (GOWN DISPOSABLE) ×4
GOWN STRL REUS W/TWL XL LVL3 (GOWN DISPOSABLE) ×4
HANDPIECE INTERPULSE COAX TIP (DISPOSABLE) ×2
IMMOBILIZER KNEE 22 UNIV (SOFTGOODS) ×2 IMPLANT
KIT BASIN OR (CUSTOM PROCEDURE TRAY) ×2 IMPLANT
KIT ROOM TURNOVER OR (KITS) ×2 IMPLANT
MANIFOLD NEPTUNE II (INSTRUMENTS) ×2 IMPLANT
NDL SAFETY ECLIPSE 18X1.5 (NEEDLE) IMPLANT
NEEDLE HYPO 18GX1.5 SHARP (NEEDLE)
NS IRRIG 1000ML POUR BTL (IV SOLUTION) ×2 IMPLANT
PACK TOTAL JOINT (CUSTOM PROCEDURE TRAY) ×2 IMPLANT
PAD ARMBOARD 7.5X6 YLW CONV (MISCELLANEOUS) ×2 IMPLANT
PADDING CAST COTTON 6X4 STRL (CAST SUPPLIES) ×4 IMPLANT
SET HNDPC FAN SPRY TIP SCT (DISPOSABLE) ×1 IMPLANT
SET PAD KNEE POSITIONER (MISCELLANEOUS) ×2 IMPLANT
STAPLER VISISTAT 35W (STAPLE) IMPLANT
STRIP CLOSURE SKIN 1/2X4 (GAUZE/BANDAGES/DRESSINGS) IMPLANT
SUCTION FRAZIER HANDLE 10FR (MISCELLANEOUS)
SUCTION TUBE FRAZIER 10FR DISP (MISCELLANEOUS) IMPLANT
SUT MNCRL AB 4-0 PS2 18 (SUTURE) IMPLANT
SUT VIC AB 0 CT1 27 (SUTURE) ×8
SUT VIC AB 0 CT1 27XBRD ANBCTR (SUTURE) ×1 IMPLANT
SUT VIC AB 1 CT1 27 (SUTURE) ×8
SUT VIC AB 1 CT1 27XBRD ANBCTR (SUTURE) ×2 IMPLANT
SUT VIC AB 2-0 CT1 27 (SUTURE) ×4
SUT VIC AB 2-0 CT1 TAPERPNT 27 (SUTURE) ×2 IMPLANT
SYR 50ML LL SCALE MARK (SYRINGE) IMPLANT
TOWEL OR 17X24 6PK STRL BLUE (TOWEL DISPOSABLE) ×2 IMPLANT
TOWEL OR 17X26 10 PK STRL BLUE (TOWEL DISPOSABLE) ×2 IMPLANT
TRAY CATH 16FR W/PLASTIC CATH (SET/KITS/TRAYS/PACK) IMPLANT
WATER STERILE IRR 1000ML POUR (IV SOLUTION) ×4 IMPLANT
WRAP KNEE MAXI GEL POST OP (GAUZE/BANDAGES/DRESSINGS) ×2 IMPLANT

## 2015-08-10 NOTE — Anesthesia Procedure Notes (Signed)
Procedure Name: LMA Insertion Date/Time: 08/10/2015 12:45 PM Performed by: Melina Copa, Denya Buckingham R Pre-anesthesia Checklist: Patient identified, Emergency Drugs available, Suction available and Patient being monitored Patient Re-evaluated:Patient Re-evaluated prior to inductionOxygen Delivery Method: Circle System Utilized Preoxygenation: Pre-oxygenation with 100% oxygen Intubation Type: IV induction Ventilation: Mask ventilation without difficulty LMA: LMA inserted LMA Size: 5.0 Number of attempts: 1 Placement Confirmation: positive ETCO2 Tube secured with: Tape Dental Injury: Teeth and Oropharynx as per pre-operative assessment

## 2015-08-10 NOTE — Anesthesia Preprocedure Evaluation (Addendum)
Anesthesia Evaluation    Reviewed: reviewed documented beta blocker date and time   Airway Mallampati: II  TM Distance: >3 FB Neck ROM: Limited    Dental  (+) Teeth Intact   Pulmonary shortness of breath, asthma , neg sleep apnea, neg COPD, neg recent URI, neg PE   breath sounds clear to auscultation       Cardiovascular hypertension, Pt. on medications and Pt. on home beta blockers (-) angina(-) Past MI and (-) CHF (-) dysrhythmias  Rhythm:Regular     Neuro/Psych PSYCHIATRIC DISORDERS Multiple neck and lower back surgeries, spinal stenosis per patient, no lower extremity sympmtoms, UE bilateral numbness intermittently,   Neuromuscular disease    GI/Hepatic Neg liver ROS, GERD  Controlled,  Endo/Other  Morbid obesity  Renal/GU negative Renal ROS     Musculoskeletal  (+) Arthritis ,   Abdominal   Peds  Hematology   Anesthesia Other Findings   Reproductive/Obstetrics                             Anesthesia Physical Anesthesia Plan  ASA: III  Anesthesia Plan: General and Regional   Post-op Pain Management:    Induction: Intravenous  Airway Management Planned: LMA and Oral ETT  Additional Equipment: None  Intra-op Plan:   Post-operative Plan: Extubation in OR  Informed Consent: I have reviewed the patients History and Physical, chart, labs and discussed the procedure including the risks, benefits and alternatives for the proposed anesthesia with the patient or authorized representative who has indicated his/her understanding and acceptance.   Dental advisory given  Plan Discussed with: CRNA and Surgeon  Anesthesia Plan Comments:        Anesthesia Quick Evaluation

## 2015-08-10 NOTE — Brief Op Note (Signed)
08/10/2015  2:42 PM  PATIENT:  [name]  65 y.o. female  PRE-OPERATIVE DIAGNOSIS:  severe osteoarthritis left knee  POST-OPERATIVE DIAGNOSIS:  severe osteoarthritis left knee  PROCEDURE:  Procedure(s): LEFT TOTAL KNEE ARTHROPLASTY (Left)  SURGEON:  Surgeon(s) and Role:    * Mcarthur Rossetti, MD - Primary  ASSISTANTS: Laure Kidney, RNFA   ANESTHESIA:   regional and general  EBL:  Total I/O In: 1000 [I.V.:1000] Out: -   COUNTS:  YES  TOURNIQUET:   Total Tourniquet Time Documented: Thigh (Left) - 71 minutes Total: Thigh (Left) - 71 minutes   DICTATION: .Other Dictation: Dictation Number [id number]  PLAN OF CARE: Admit to inpatient   PATIENT DISPOSITION:  PACU - hemodynamically stable.   Delay start of Pharmacological VTE agent (>24hrs) due to surgical blood loss or risk of bleeding: no

## 2015-08-10 NOTE — Transfer of Care (Signed)
Immediate Anesthesia Transfer of Care Note  Patient: [name]  Procedure(s) Performed: Procedure(s): LEFT TOTAL KNEE ARTHROPLASTY (Left)  Patient Location: PACU  Anesthesia Type:GA combined with regional for post-op pain  Level of Consciousness: awake, oriented and patient cooperative  Airway & Oxygen Therapy: Patient Spontanous Breathing and Patient connected to nasal cannula oxygen  Post-op Assessment: Report given to RN, Post -op Vital signs reviewed and stable and Patient moving all extremities  Post vital signs: Reviewed and stable  Last Vitals:  Vitals:   08/10/15 1010 08/10/15 1504  BP: (!) 166/81   Pulse: 74   Resp: 18   Temp: 36.8 C 36.1 C    Last Pain:  Vitals:   08/10/15 1504  TempSrc:   PainSc: 0-No pain         Complications: No apparent anesthesia complications

## 2015-08-10 NOTE — Progress Notes (Signed)
Orthopedic Tech Progress Note Patient Details:  [name] Nov 04, 1950 [id number]  CPM Left Knee CPM Left Knee: On Left Knee Flexion (Degrees): 90 Left Knee Extension (Degrees): 0  Ortho Devices Ortho Device/Splint Location: applied ohf to bed Ortho Device/Splint Interventions: Ordered, Application   Braulio Bosch 08/10/2015, 4:03 PM

## 2015-08-10 NOTE — Op Note (Signed)
NAMEMARIEKA, BELUE                ACCOUNT NO.:  [id number]  MEDICAL RECORD NO.:  [id number]  LOCATION:  [facility]                         FACILITY:  [facility]  PHYSICIAN:  Lind Guest. Ninfa Linden, M.D.DATE OF BIRTH:  January 02, 1951  DATE OF PROCEDURE:  08/10/2015 DATE OF DISCHARGE:                              OPERATIVE REPORT   PREOPERATIVE DIAGNOSIS:  Primary osteoarthritis and degenerative joint disease of left knee.  POSTOPERATIVE DIAGNOSIS:  Primary osteoarthritis and degenerative joint disease of left knee.  PROCEDURE:  Left total knee arthroplasty.  IMPLANTS:  Stryker Triathlon knee with size 4 femur, size 4 tibial tray, 11-mm fix-bearing polyethylene insert, size 29 patellar button.  SURGEON:  Lind Guest. Ninfa Linden, M.D.  ASSISTANT:  Laure Kidney, RNFA.  ANESTHESIA: 1. Regional left lower extremity block. 2. General.  TOURNIQUET TIME:  Under 2 hours.  BLOOD LOSS:  Under 200 mL.  ANTIBIOTICS:  2 g of IV Ancef.  COMPLICATIONS:  None.  INDICATIONS:  Ms. Nostrand is a 65 year old female with debilitating arthritis involving her left knee.  This has been well documented.  She has x-rays that show a varus deformity of her knee with complete loss of the joint space throughout the knee.  She is someone who has had successful bilateral hip replacements as well as a right total knee replacement.  Her left knee hurts her daily, it is detrimentally affected her activities of daily living, her mobility and her quality of life.  At that point, she does wish to proceed with a total knee arthroplasty on the left knee.  The risks and benefits were explained to her in detail including the risk of acute blood loss anemia, nerve and vessel injury, fracture, infection, DVT.  She understands our goals are decreased pain, improved mobility and overall improved quality of life.  PROCEDURE DESCRIPTION:  After informed consent was obtained, appropriate left leg was marked.  Anesthesia obtained  an adductor canal block.  She was then brought to the operating room, placed supine on the operating table.  General anesthesia was then obtained.  A nonsterile tourniquet was placed around her upper left thigh and her left leg was prepped and draped from the thigh down the toes with DuraPrep and sterile drapes including a sterile stockinette.  Time-out was called and she was identified as correct patient and correct left knee.  We then used an Esmarch to wrap out the leg and tourniquet was inflated to 300 mm of pressure.  We then made a direct midline incision over the patella and carried this proximally and distally.  We dissected down the knee joint and carried out a medial parapatellar arthrotomy.  We then were able to open up the knee joint in its entirety and found significant loss of the cartilage on the medial aspect of the knee and osteophytes throughout the knee.  The meniscus was significantly shredded medially, degeneratively.  We removed remnants of the ACL, PCL, medial and lateral meniscus with the knee in a flexed position, we used the extramedullary cutting guide for the tibia.  We set to correct for neutral varus, valgus and neutral slope.  We set our cut for taking 9-mm off the high side.  We  made this cut without difficulty.  We then went to the femur and set our distal femoral cut for 10-mm distal femoral cut.  We used the intramedullary guide for this and set our cutting guide for 5 degrees externally rotated to the left.  We made our distal femoral cut at 10 mm and then brought the knee back down to full extension and we had full extension with our 9-mm cutting block.  We then went back to the femur and put our femoral sizing guide based off the epicondylar axis and chose a size 4 femur.  We brought our 4-in-1 cutting block for a size 4 femur, made our anterior and posterior cuts followed by our chamfer cuts.  We then made our femoral box cut.  We went back to  the tibia and set our rotation off the femur and the tibial tubercle.  We chose a size 4 tibia and made our keel cut off this.  With the size 4 trial tibia in place followed by the 4 femur, we placed a 9-mm trial polyethylene insert and then we went to an 11 and I like the stability of 11.  We then made our patellar cut as well.  I chose the size 29 patellar button.  We then removed all implants and irrigated the knee with normal saline solution using pulsatile lavage.  We mixed our cement and cemented the real Stryker Triathlon tibial tray size 4 followed by the real 4 femur.  We cleaned the cement debris from the knee and placed our 11-mm fix-bearing polyethylene insert.  We then cemented our patellar button.  Once the cement had hardened, we let the tourniquet down, hemostasis was obtained with electrocautery.  We irrigated the knee again with normal saline solution and then closed the joint capsule with interrupted #1 Ethibond suture followed by 0 Vicryl in the deep tissue, 2-0 Vicryl in the subcutaneous tissue and interrupted staples on the skin.  Xeroform and well-padded sterile dressing were applied. Tourniquet was let down.  Her toes pinking nicely.  Knee was placed in a knee immobilizer.  She was awakened, extubated and taken to the recovery room in stable condition.  All final counts were correct.  There were no complications noted.     Lind Guest. Ninfa Linden, M.D.     CYB/MEDQ  D:  08/10/2015  T:  08/10/2015  Job:  [id number]

## 2015-08-10 NOTE — H&P (Signed)
TOTAL KNEE ADMISSION H&P  Patient is being admitted for left total knee arthroplasty.  Subjective:  Chief Complaint:left knee pain.  HPI: [name], 65 y.o. female, has a history of pain and functional disability in the left knee due to arthritis and has failed non-surgical conservative treatments for greater than 12 weeks to includeNSAID's and/or analgesics, corticosteriod injections, viscosupplementation injections, flexibility and strengthening excercises, supervised PT with diminished ADL's post treatment, use of assistive devices, weight reduction as appropriate and activity modification.  Onset of symptoms was gradual, starting 3 years ago with gradually worsening course since that time. The patient noted no past surgery on the left knee(s).  Patient currently rates pain in the left knee(s) at 10 out of 10 with activity. Patient has night pain, worsening of pain with activity and weight bearing, pain that interferes with activities of daily living, pain with passive range of motion, crepitus and joint swelling.  Patient has evidence of subchondral cysts, subchondral sclerosis, periarticular osteophytes and joint space narrowing by imaging studies. There is no active infection.  Patient Active Problem List   Diagnosis Date Noted  . Osteoarthritis of left knee 08/10/2015  . Osteoarthritis of left hip 11/02/2014  . Status post total replacement of left hip 11/02/2014  . Osteoarthritis of right hip 09/08/2014  . Status post total replacement of right hip 09/08/2014  . PMB (postmenopausal bleeding) 02/28/2013  . Morbid obesity ([place]) 02/28/2013  . Pure hypercholesterolemia 02/28/2013  . Essential hypertension, benign 02/28/2013   Past Medical History:  Diagnosis Date  . Abnormal Pap smear of cervix 03/2009   Colpo Biopsy CIN I with HPV  . Arthritis   . Asthma   . Bilateral edema of lower extremity    Takes lasix if needed  . Bronchitis   . Disc disorder    bulging disc in thoracic  area  . GERD (gastroesophageal reflux disease)    occ  . Hypercholesteremia   . Hypertension since 1990's  . Lumbar herniated disc   . Obesity   . Pneumonia    hx  . Scoliosis   . Spinal stenosis   . Teeth grinding     Past Surgical History:  Procedure Laterality Date  . APPENDECTOMY  1965  . BACK SURGERY  1993   Thoracic and Lumbar  . BREAST BIOPSY Right 1990   benign cyst  . CERVICAL [place] SURGERY  2007  . COLONOSCOPY W/ POLYPECTOMY    . [place], as child   bilateral inguinal -1 during pregnancy.  Umbilical repair as child  . KNEE ARTHROSCOPY Right 1990  . [place] SURGERY  2006  . SHOULDER ARTHROSCOPY Left 1994   arthritis, hips,fingers  . TONSILLECTOMY AND ADENOIDECTOMY    . TOTAL HIP ARTHROPLASTY Right 09/08/2014   Procedure: RIGHT TOTAL HIP ARTHROPLASTY ANTERIOR APPROACH;  Surgeon: Mcarthur Rossetti, MD;  Location: [facility];  Service: Orthopedics;  Laterality: Right;  . TOTAL HIP ARTHROPLASTY Left 11/02/2014   Procedure: LEFT TOTAL HIP ARTHROPLASTY ANTERIOR APPROACH;  Surgeon: Mcarthur Rossetti, MD;  Location: [facility];  Service: Orthopedics;  Laterality: Left;  . TOTAL KNEE ARTHROPLASTY Right 2009    Prescriptions Prior to Admission  Medication Sig Dispense Refill Last Dose  . cholecalciferol (VITAMIN D) 1000 UNITS tablet Take 1,000 Units by mouth 2 (two) times daily.   Past Week at Unknown time  . Cinnamon 500 MG capsule Take 500 mg by mouth 2 (two) times daily.   Past Week at Unknown time  .  Coenzyme Q10 (COQ10) 200 MG CAPS Take 200 mg by mouth at bedtime.   Past Week at Unknown time  . cyclobenzaprine (FLEXERIL) 10 MG tablet Take 10 mg by mouth 2 (two) times daily as needed for muscle spasms.    Past Week at Unknown time  . diclofenac (VOLTAREN) 75 MG EC tablet Take 1 tablet (75 mg total) by mouth 2 (two) times daily as needed for mild pain. 60 tablet 1 Past Week at Unknown time  . docusate sodium (COLACE) 100 MG capsule Take 200 mg by mouth at  bedtime.   08/09/2015 at Unknown time  . felodipine (PLENDIL) 10 MG 24 hr tablet Take 10 mg by mouth daily.   08/09/2015 at Unknown time  . FLAXSEED, LINSEED, PO Take 1,300 mg by mouth daily.    Past Week at Unknown time  . furosemide (LASIX) 20 MG tablet Take 20-30 mg by mouth daily as needed (for leg swelling).    Past Week at Unknown time  . Garlic 123XX123 MG CAPS Take 1,000 mg by mouth daily.   Past Week at Unknown time  . Grape Seed 50 MG TABS Take 50 mg by mouth daily.   Past Week at Unknown time  . ipratropium-albuterol (DUONEB) 0.5-2.5 (3) MG/3ML SOLN Take 3 mLs by nebulization every 6 (six) hours as needed (shortness of breath).    Past Week at Unknown time  . irbesartan (AVAPRO) 300 MG tablet Take 300 mg by mouth daily.   08/09/2015 at Unknown time  . labetalol (NORMODYNE) 200 MG tablet Take 400 mg by mouth 2 (two) times daily.    08/10/2015 at Unknown time  . lactobacillus acidophilus (BACID) TABS tablet Take 2 tablets by mouth every morning.   Past Week at Unknown time  . Lecithin 1200 MG CAPS Take 1,200 mg by mouth daily.   Past Week at Unknown time  . lidocaine-hydrocortisone (LIDAZONE HC) 3-0.5 % CREA Place 1 Applicatorful rectally as needed (hemorrhoids).    Past Week at Unknown time  . Lutein 20 MG TABS Take 20 mg by mouth daily.    Past Week at Unknown time  . Multiple Vitamin (MULTIVITAMIN WITH MINERALS) TABS tablet Take 0.5 tablets by mouth 2 (two) times daily.    02/19/2015 at am  . Omega 3 1200 MG CAPS Take 1,200 mg by mouth 2 (two) times daily.   Past Week at Unknown time  . simvastatin (ZOCOR) 20 MG tablet Take 20 mg by mouth at bedtime.    08/09/2015 at Unknown time  . triamcinolone cream (KENALOG) 0.1 % Apply 1 application topically 2 (two) times daily as needed (rash).    Past Week at Unknown time  . vitamin C (ASCORBIC ACID) 500 MG tablet Take 500 mg by mouth daily.   Past Week at Unknown time  . albuterol (ACCUNEB) 0.63 MG/3ML nebulizer solution Take 1 ampule by nebulization every 6  (six) hours as needed for wheezing or shortness of breath.    More than a month at Unknown time  . albuterol (PROAIR HFA) 108 (90 Base) MCG/ACT inhaler Inhale 2 puffs into the lungs every 6 (six) hours as needed for wheezing or shortness of breath.   More than a month at Unknown time  . budesonide (PULMICORT) 1 MG/2ML nebulizer solution Take 1 mg by nebulization daily as needed (for wheezing or shortness of breath).    More than a month at Unknown time  . miconazole (MICOTIN) 2 % powder Apply topically as needed for itching (also uses  cream).      Allergies  Allergen Reactions  . Adhesive [Tape] Rash and Other (See Comments)    Blisters - reaction to adhesive on any type of bandages if stay long time  . Lortab [Hydrocodone-Acetaminophen] Nausea Only and Rash    Can take if pt has to    Social History  Substance Use Topics  . Smoking status: Never Smoker  . Smokeless tobacco: Never Used  . Alcohol use Yes     Comment: rarely    Family History  Problem Relation Age of Onset  . Breast cancer Sister 36    bilateral mastectomy- Breast cancer   . Hypertension Mother   . Heart disease Mother   . Stroke Mother   . Heart disease Father   . Depression Father   . Alcohol abuse Father   . Stroke Maternal Grandmother   . Emphysema Paternal Grandmother   . Breast cancer Maternal Aunt     Both aunts in 102 -14's  . Heart failure Paternal Grandfather   . Alcohol abuse Paternal Grandfather   . Depression Daughter      Review of Systems  Musculoskeletal: Positive for joint pain.  All other systems reviewed and are negative.   Objective:  Physical Exam  Constitutional: She is oriented to person, place, and time. She appears well-developed and well-nourished.  HENT:  Head: Normocephalic and atraumatic.  Eyes: EOM are normal. Pupils are equal, round, and reactive to light.  Neck: Normal range of motion. Neck supple.  Cardiovascular: Normal rate and regular rhythm.   Respiratory: Effort  normal and breath sounds normal.  GI: Soft. Bowel sounds are normal.  Musculoskeletal:       Left knee: She exhibits decreased range of motion, swelling, effusion and abnormal alignment. Tenderness found. Medial joint line and lateral joint line tenderness noted.  Neurological: She is alert and oriented to person, place, and time.  Skin: Skin is warm and dry.  Psychiatric: She has a normal mood and affect.    Vital signs in last 24 hours: Temp:  [98.2 F (36.8 C)] 98.2 F (36.8 C) (08/08 1010) Pulse Rate:  [74] 74 (08/08 1010) Resp:  [18] 18 (08/08 1010) BP: (166)/(81) 166/81 (08/08 1010) SpO2:  [96 %] 96 % (08/08 1010) Weight:  [112.9 kg (249 lb)] 112.9 kg (249 lb) (08/08 1010)  Labs:   Estimated body mass index is 45.54 kg/m as calculated from the following:   Height as of 08/03/15: 5\' 2"  (1.575 m).   Weight as of this encounter: 112.9 kg (249 lb).   Imaging Review Plain radiographs demonstrate severe degenerative joint disease of the left knee(s). The overall alignment ismild varus. The bone quality appears to be good for age and reported activity level.  Assessment/Plan:  End stage arthritis, left knee   The patient history, physical examination, clinical judgment of the provider and imaging studies are consistent with end stage degenerative joint disease of the left knee(s) and total knee arthroplasty is deemed medically necessary. The treatment options including medical management, injection therapy arthroscopy and arthroplasty were discussed at length. The risks and benefits of total knee arthroplasty were presented and reviewed. The risks due to aseptic loosening, infection, stiffness, patella tracking problems, thromboembolic complications and other imponderables were discussed. The patient acknowledged the explanation, agreed to proceed with the plan and consent was signed. Patient is being admitted for inpatient treatment for surgery, pain control, PT, OT, prophylactic  antibiotics, VTE prophylaxis, progressive ambulation and ADL's and discharge planning.  The patient is planning to be discharged to skilled nursing facility

## 2015-08-11 ENCOUNTER — Encounter (HOSPITAL_COMMUNITY): Payer: Self-pay | Admitting: General Practice

## 2015-08-11 LAB — BASIC METABOLIC PANEL
Anion gap: 12 (ref 5–15)
BUN: 9 mg/dL (ref 6–20)
CHLORIDE: 100 mmol/L — AB (ref 101–111)
CO2: 24 mmol/L (ref 22–32)
CREATININE: 0.54 mg/dL (ref 0.44–1.00)
Calcium: 8.2 mg/dL — ABNORMAL LOW (ref 8.9–10.3)
GFR calc non Af Amer: >60 mL/min (ref >60)
Glucose, Bld: 136 mg/dL — ABNORMAL HIGH (ref 65–99)
POTASSIUM: 3.4 mmol/L — AB (ref 3.5–5.1)
SODIUM: 136 mmol/L (ref 135–145)

## 2015-08-11 LAB — CBC
HCT: 36.2 % (ref 36.0–46.0)
HEMOGLOBIN: 11.5 g/dL — AB (ref 12.0–15.0)
MCH: 27.7 pg (ref 26.0–34.0)
MCHC: 31.8 g/dL (ref 30.0–36.0)
MCV: 87.2 fL (ref 78.0–100.0)
Platelets: 192 10*3/uL (ref 150–400)
RBC: 4.15 MIL/uL (ref 3.87–5.11)
RDW: 13.9 % (ref 11.5–15.5)
WBC: 9.5 10*3/uL (ref 4.0–10.5)

## 2015-08-11 MED ORDER — HYDROCODONE-ACETAMINOPHEN 5-325 MG PO TABS
2.0000 | ORAL_TABLET | ORAL | Status: DC | PRN
  Administered 2015-08-11: 2 via ORAL
  Filled 2015-08-11: qty 2

## 2015-08-11 NOTE — Progress Notes (Signed)
Physical Therapy Treatment Patient Details Name: [name] MRN: [id number] DOB: 1950/12/05 Today's Date: 08/11/2015    History of Present Illness 65 y.o. female now s/p Lt TKA. PMH: bilateral THA, Rt TKA, back surgery, spinal stenosis, hypertension, asthma.     PT Comments    Patient is progressing well toward mobility goals. Tolerated gait training and therex well. Continue to progress as tolerated.   Follow Up Recommendations  SNF;Supervision for mobility/OOB     Equipment Recommendations  None recommended by PT    Recommendations for Other Services       Precautions / Restrictions Precautions Precautions: Fall;Knee Precaution Booklet Issued: Yes (comment) Precaution Comments: HEP provided, reviewed knee extension precautions Restrictions Weight Bearing Restrictions: Yes LLE Weight Bearing: Weight bearing as tolerated    Mobility  Bed Mobility Overal bed mobility: Needs Assistance Bed Mobility: Supine to Sit     Supine to sit: Supervision     General bed mobility comments: supervision for safety; no use of rail to come into sitting but use of rail and increased time to return to supine  Transfers Overall transfer level: Needs assistance Equipment used: Rolling walker (2 wheeled) Transfers: Sit to/from Stand Sit to Stand: Min guard         General transfer comment: from EOB and 3 in 1; cues for safe hand placement with carry over and good technique; min guard for safety; no physical assist  Ambulation/Gait Ambulation/Gait assistance: Min guard Ambulation Distance (Feet): 60 Feet Assistive device: Rolling walker (2 wheeled) Gait Pattern/deviations: Step-through pattern;Decreased weight shift to left;Decreased stride length;Trunk flexed Gait velocity: decreased   General Gait Details: cues for posture and encouraged increased WB through L LE; slow, steady gait with descent step symmetry and bilat heel strike   Stairs            Wheelchair  Mobility    Modified Rankin (Stroke Patients Only)       Balance Overall balance assessment: Needs assistance           Standing balance-Leahy Scale: Fair                      Cognition Arousal/Alertness: Awake/alert Behavior During Therapy: WFL for tasks assessed/performed Overall Cognitive Status: Within Functional Limits for tasks assessed                      Exercises Total Joint Exercises Quad Sets: Strengthening;10 reps;Both;Supine Heel Slides: AROM;Left;10 reps;Supine Long Arc Quad: AROM;Left;10 reps;Seated Goniometric ROM: ~75    General Comments        Pertinent Vitals/Pain Pain Assessment: Faces Pain Score: 4  Faces Pain Scale: Hurts even more Pain Location: L knee with therex Pain Descriptors / Indicators: Discomfort;Grimacing;Sore Pain Intervention(s): Limited activity within patient's tolerance;Monitored during session;Premedicated before session;Repositioned    Home Living Family/patient expects to be discharged to:: Other (Comment) (rehab) Living Arrangements: Alone           Home Equipment: Walker - 2 wheels;Cane - single point;Wheelchair - manual      Prior Function Level of Independence: Independent          PT Goals (current goals can now be found in the care plan section) Acute Rehab PT Goals Patient Stated Goal: be independent again PT Goal Formulation: With patient Time For Goal Achievement: 08/25/15 Potential to Achieve Goals: Good Progress towards PT goals: Progressing toward goals    Frequency  7X/week    PT Plan Current plan remains appropriate  Co-evaluation             End of Session Equipment Utilized During Treatment: Gait belt Activity Tolerance: Patient tolerated treatment well Patient left: with call bell/phone within reach;in bed;in CPM;with family/visitor present (in knee extension )     Time: [id number] PT Time Calculation (min) (ACUTE ONLY): 46 min  Charges:  $Gait Training:  8-22 mins $Therapeutic Exercise: 8-22 mins $Therapeutic Activity: 8-22 mins                    G Codes:      Salina April, PTA Pager: [phone]   08/11/2015, 4:38 PM

## 2015-08-11 NOTE — Progress Notes (Signed)
Subjective: 1 Day Post-Op Procedure(s) (LRB): LEFT TOTAL KNEE ARTHROPLASTY (Left) Patient reports pain as moderate.    Objective: Vital signs in last 24 hours: Temp:  [97 F (36.1 C)-99.3 F (37.4 C)] 99.3 F (37.4 C) (08/09 0411) Pulse Rate:  [61-82] 70 (08/09 0855) Resp:  [9-16] 16 (08/09 0411) BP: (133-148)/(52-90) 143/61 (08/09 0855) SpO2:  [96 %-100 %] 96 % (08/09 0411)  Intake/Output from previous day: 08/08 0701 - 08/09 0700 In: 1350 [P.O.:50; I.V.:1300] Out: 75 [Blood:75] Intake/Output this shift: Total I/O In: 240 [P.O.:240] Out: -    Recent Labs  08/11/15 0339  HGB 11.5*    Recent Labs  08/11/15 0339  WBC 9.5  RBC 4.15  HCT 36.2  PLT 192    Recent Labs  08/11/15 0339  NA 136  K 3.4*  CL 100*  CO2 24  BUN 9  CREATININE 0.54  GLUCOSE 136*  CALCIUM 8.2*   No results for input(s): LABPT, INR in the last 72 hours.  Sensation intact distally Intact pulses distally Dorsiflexion/Plantar flexion intact Incision: dressing C/D/I Compartment soft  Assessment/Plan: 1 Day Post-Op Procedure(s) (LRB): LEFT TOTAL KNEE ARTHROPLASTY (Left) Up with therapy Discharge home with home health Friday  [name] 08/11/2015, 12:49 PM

## 2015-08-11 NOTE — Plan of Care (Signed)
Problem: Activity: Goal: Risk for activity intolerance will decrease Outcome: Progressing Tolerates activities very well  Problem: Bowel/Gastric: Goal: Will not experience complications related to bowel motility Outcome: Progressing No bowel issues reported  Problem: Activity: Goal: Ability to avoid complications of mobility impairment will improve Outcome: Progressing Patient is very mobile Goal: Range of joint motion will improve Outcome: Progressing Active ROM all extremities Goal: Will remain free from falls Outcome: Progressing No fall or injury noted, patient calls for assistance to get out of bed  Problem: Education: Goal: Understanding of discharge needs will improve Outcome: Progressing Patient is very knowlegeable  Problem: Physical Regulation: Goal: Postoperative complications will be avoided or minimized Outcome: Progressing No complications noted  Problem: Pain Management: Goal: Pain level will decrease with appropriate interventions Outcome: Progressing Pain managed with Tylenol, Robaxin and Oxycodone

## 2015-08-11 NOTE — Progress Notes (Signed)
Occupational Therapy Evaluation Patient Details Name: [name] MRN: [id number] DOB: [birth year] Today's Date: 08/11/2015    History of Present Illness 65 y.o. female now s/p Lt TKA. PMH: bilateral THA, Rt TKA, back surgery, spinal stenosis, hypertension, asthma.    Clinical Impression   PTA, pt independent with mobility and ADL. Pt plans to go to [facility] for rehab after D/C. Pt will benefit form rehab at SNF. Defer further OT to SNF.     Follow Up Recommendations  SNF    Equipment Recommendations  None recommended by OT    Recommendations for Other Services       Precautions / Restrictions Precautions Precautions: Fall;Knee Precaution Booklet Issued: Yes (comment) Precaution Comments: HEP provided, reviewed knee extension precautions Restrictions Weight Bearing Restrictions: Yes LLE Weight Bearing: Weight bearing as tolerated      Mobility Bed Mobility Overal bed mobility: Needs Assistance Bed Mobility: Supine to Sit     Supine to sit: Supervision     General bed mobility comments: pt able to slide LLE to EOB without assistance, using rail to assist to sitting EOB  Transfers Overall transfer level: Needs assistance Equipment used: Rolling walker (2 wheeled) Transfers: Sit to/from Stand Sit to Stand: Min guard             Balance Overall balance assessment: Needs assistance Sitting-balance support: No upper extremity supported       Standing balance support: Bilateral upper extremity supported Standing balance-Leahy Scale: Fair Standing balance comment: using rw for support                            ADL Overall ADL's : Needs assistance/impaired     Grooming: Supervision/safety;Standing   Upper Body Bathing: Set up;Sitting   Lower Body Bathing: Minimal assistance;Sit to/from stand   Upper Body Dressing : Set up   Lower Body Dressing: Minimal assistance;Sit to/from stand   Toilet Transfer: Supervision/safety;RW    Toileting- Water quality scientist and Hygiene: Supervision/safety    States she has been walking to the bathroom with staff   Functional mobility during ADLs: Supervision/safety;Rolling walker       Vision     Perception     Praxis      Pertinent Vitals/Pain Pain Assessment: 0-10 Pain Score: 4  Pain Location: L knee Pain Descriptors / Indicators: Aching;Discomfort Pain Intervention(s): Limited activity within patient's tolerance;Ice applied;Repositioned     Hand Dominance     Extremity/Trunk Assessment Upper Extremity Assessment Upper Extremity Assessment: Overall WFL for tasks assessed   Lower Extremity Assessment Lower Extremity Assessment: Defer to PT evaluation    Cervical / Trunk Assessment Cervical / Trunk Assessment: Normal   Communication Communication Communication: No difficulties   Cognition Arousal/Alertness: Awake/alert Behavior During Therapy: WFL for tasks assessed/performed Overall Cognitive Status: Within Functional Limits for tasks assessed                     General Comments       Exercises Exercises: Total Joint     Shoulder Instructions      Home Living Family/patient expects to be discharged to:: Skilled nursing facility Living Arrangements: Alone                           Home Equipment: Gilford Rile - 2 wheels;Cane - single point;Wheelchair - manual          Prior Functioning/Environment Level of Independence: Independent  OT Diagnosis: Generalized weakness;Acute pain   OT Problem List: Decreased strength;Decreased activity tolerance;Decreased range of motion;Decreased knowledge of use of DME or AE;Pain;Obesity   OT Treatment/Interventions:      OT Goals(Current goals can be found in the care plan section) Acute Rehab OT Goals Patient Stated Goal: eventually get back home OT Goal Formulation: All assessment and education complete, DC therapy (acute ot)  OT Frequency:     Barriers to D/C:             Co-evaluation              End of Session Equipment Utilized During Treatment: Rolling walker CPM Left Knee CPM Left Knee: Off Nurse Communication: Mobility status  Activity Tolerance: Patient tolerated treatment well Patient left: in bed;with call bell/phone within reach;with family/visitor present   Time: [id number] OT Time Calculation (min): 20 min Charges:  OT General Charges $OT Visit: 1 Procedure OT Evaluation $OT Eval Low Complexity: 1 Procedure G-Codes:    Florabel Faulks,HILLARY 2015-09-05, 2:27 PM  [facility], OTR/L  [phone] Sep 05, 2015

## 2015-08-11 NOTE — Progress Notes (Signed)
Orthopedic Tech Progress Note Patient Details:  [name] September 28, 1950 [id number]  Patient ID: [name], female   DOB: 1950/05/22, 65 y.o.   MRN: [id number]   Hildred Priest 08/11/2015, 1:57 PM Placed pt's lle on cpm @ 0-60 degrees @1400 

## 2015-08-11 NOTE — Anesthesia Postprocedure Evaluation (Signed)
Anesthesia Post Note  Patient: [name]  Procedure(s) Performed: Procedure(s) (LRB): LEFT TOTAL KNEE ARTHROPLASTY (Left)  Patient location during evaluation: PACU Anesthesia Type: General and Regional Level of consciousness: awake Pain management: pain level controlled Vital Signs Assessment: post-procedure vital signs reviewed and stable Respiratory status: spontaneous breathing Cardiovascular status: stable Postop Assessment: no signs of nausea or vomiting Anesthetic complications: no    Last Vitals:  Vitals:   08/11/15 0411 08/11/15 0855  BP: (!) 135/54 (!) 143/61  Pulse: 78 70  Resp: 16   Temp: 37.4 C     Last Pain:  Vitals:   08/11/15 1024  TempSrc:   PainSc: 5                  Edilia Ghuman

## 2015-08-11 NOTE — Progress Notes (Signed)
Orthopedic Tech Progress Note Patient Details:  [name] Jul 27, 1950 [id number]  Patient ID: [name], female   DOB: February 16, 1950, 65 y.o.   MRN: [id number] Applied cpm 0-60  Karolee Stamps 08/11/2015, 5:53 AM

## 2015-08-11 NOTE — Evaluation (Signed)
Physical Therapy Evaluation Patient Details Name: [name] MRN: [id number] DOB: Nov 29, 1950 Today's Date: 08/11/2015   History of Present Illness  65 y.o. female now s/p Lt TKA. PMH: bilateral THA, Rt TKA, back surgery, spinal stenosis, hypertension, asthma.   Clinical Impression  Pt is s/p TKA resulting in the deficits listed below (see PT Problem List). Pt ambulating 20 ft with rw, min guard assist.  Pt will benefit from skilled PT to increase their independence and safety with mobility to allow discharge to the venue listed below.      Follow Up Recommendations SNF;Supervision for mobility/OOB    Equipment Recommendations  None recommended by PT    Recommendations for Other Services       Precautions / Restrictions Precautions Precautions: Fall;Knee Precaution Booklet Issued: Yes (comment) Precaution Comments: HEP provided, reviewed knee extension precautions Restrictions Weight Bearing Restrictions: Yes LLE Weight Bearing: Weight bearing as tolerated      Mobility  Bed Mobility Overal bed mobility: Needs Assistance Bed Mobility: Supine to Sit     Supine to sit: Supervision     General bed mobility comments: pt able to slide LLE to EOB without assistance, using rail to assist to sitting EOB  Transfers Overall transfer level: Needs assistance Equipment used: Rolling walker (2 wheeled) Transfers: Sit to/from Stand Sit to Stand: Min guard         General transfer comment: good hand position, mild instability with initial stand.   Ambulation/Gait Ambulation/Gait assistance: Min guard Ambulation Distance (Feet): 20 Feet Assistive device: Rolling walker (2 wheeled) Gait Pattern/deviations: Step-to pattern;Decreased weight shift to left Gait velocity: decreased   General Gait Details: encouraging even weightbearing, no loss of balance.   Stairs            Wheelchair Mobility    Modified Rankin (Stroke Patients Only)       Balance Overall  balance assessment: Needs assistance Sitting-balance support: No upper extremity supported       Standing balance support: Bilateral upper extremity supported Standing balance-Leahy Scale: Poor Standing balance comment: using rw for support                             Pertinent Vitals/Pain Pain Assessment: 0-10 Pain Score: 5  Pain Location: Lt knee Pain Descriptors / Indicators: Aching Pain Intervention(s): Limited activity within patient's tolerance;Monitored during session;Ice applied    Home Living Family/patient expects to be discharged to:: Skilled nursing facility Living Arrangements: Alone             Home Equipment: Gilford Rile - 2 wheels;Cane - single point;Wheelchair - manual      Prior Function Level of Independence: Independent               Hand Dominance        Extremity/Trunk Assessment   Upper Extremity Assessment: Overall WFL for tasks assessed           Lower Extremity Assessment: LLE deficits/detail   LLE Deficits / Details: unable to perform SLR     Communication   Communication: No difficulties  Cognition Arousal/Alertness: Awake/alert Behavior During Therapy: WFL for tasks assessed/performed Overall Cognitive Status: Within Functional Limits for tasks assessed                      General Comments      Exercises Total Joint Exercises Ankle Circles/Pumps: AROM;Both;10 reps Quad Sets: Strengthening;Left;10 reps Gluteal Sets: Strengthening;Both;5 reps  Assessment/Plan    PT Assessment Patient needs continued PT services  PT Diagnosis Difficulty walking   PT Problem List Decreased strength;Decreased range of motion;Decreased activity tolerance;Decreased balance;Decreased mobility  PT Treatment Interventions DME instruction;Gait training;Stair training;Functional mobility training;Therapeutic activities;Therapeutic exercise;Patient/family education   PT Goals (Current goals can be found in the Care  Plan section) Acute Rehab PT Goals Patient Stated Goal: eventually get back home PT Goal Formulation: With patient Time For Goal Achievement: 08/25/15 Potential to Achieve Goals: Good    Frequency 7X/week   Barriers to discharge        Co-evaluation               End of Session Equipment Utilized During Treatment: Gait belt Activity Tolerance: Patient limited by fatigue Patient left: in chair;with call bell/phone within reach (in knee extension ) Nurse Communication: Mobility status;Weight bearing status         Time: [id number] PT Time Calculation (min) (ACUTE ONLY): 23 min   Charges:   PT Evaluation $PT Eval Moderate Complexity: 1 Procedure PT Treatments $Gait Training: 8-22 mins   PT G Codes:        Cassell Clement, PT, CSCS Pager [phone] Office [phone]  08/11/2015, 10:43 AM

## 2015-08-12 MED ORDER — HYDROCODONE-ACETAMINOPHEN 5-325 MG PO TABS: 1.0000 | ORAL_TABLET | ORAL | Status: DC | PRN

## 2015-08-12 NOTE — Progress Notes (Signed)
Physical Therapy Treatment Patient Details Name: [name] MRN: [id number] DOB: 1950-04-06 Today's Date: 08/12/2015    History of Present Illness 65 y.o. female now s/p Lt TKA. PMH: bilateral THA, Rt TKA, back surgery, spinal stenosis, hypertension, asthma.     PT Comments    [name] is making good progress but continues to need min guard assist for safe ambulation and required one seated rest break while ambulating due to feeling "woozy" which cleared quickly after sitting.  SNF remains most appropriate d/c plan at d/c.  Pt will benefit from continued skilled PT services to increase functional independence and safety.   Follow Up Recommendations  SNF;Supervision for mobility/OOB     Equipment Recommendations  None recommended by PT    Recommendations for Other Services       Precautions / Restrictions Precautions Precautions: Fall;Knee Precaution Booklet Issued: Yes (comment) Precaution Comments: reviewed knee extension precautions Restrictions Weight Bearing Restrictions: Yes LLE Weight Bearing: Weight bearing as tolerated    Mobility  Bed Mobility Overal bed mobility: Needs Assistance Bed Mobility: Supine to Sit;Sit to Supine     Supine to sit: Supervision Sit to supine: Supervision   General bed mobility comments: Supervision for safety with increased time and effort  Transfers Overall transfer level: Needs assistance Equipment used: Rolling walker (2 wheeled) Transfers: Sit to/from Stand Sit to Stand: Min guard         General transfer comment: from EOB and chair; cues for safe hand placement with carry over and good technique; min guard for safety; no physical assist  Ambulation/Gait Ambulation/Gait assistance: Min guard Ambulation Distance (Feet): 130 Feet Assistive device: Rolling walker (2 wheeled) Gait Pattern/deviations: Step-to pattern;Step-through pattern;Decreased stride length;Decreased weight shift to left;Decreased stance time -  left;Decreased step length - right;Antalgic Gait velocity: decreased   General Gait Details: Cues for upright posture and to relax shoulders with increased WB through Lt LE.  Decreased gait speed and pt required 1 seated rest break due to c/o feeling "woozy" which cleared quickly upon sitting.    Stairs            Wheelchair Mobility    Modified Rankin (Stroke Patients Only)       Balance Overall balance assessment: Needs assistance Sitting-balance support: No upper extremity supported;Feet supported Sitting balance-Leahy Scale: Good     Standing balance support: Bilateral upper extremity supported;During functional activity Standing balance-Leahy Scale: Poor Standing balance comment: Relies on RW for support                    Cognition Arousal/Alertness: Awake/alert Behavior During Therapy: WFL for tasks assessed/performed Overall Cognitive Status: Within Functional Limits for tasks assessed                      Exercises Total Joint Exercises Ankle Circles/Pumps: AROM;Both;10 reps;Supine Straight Leg Raises: AROM;Left;Other (comment);Supine (2 reps) Long Arc Quad: AROM;Left;Seated;5 reps Knee Flexion: AAROM;Left;5 reps;Seated;Other (comment) (with 5 second holds) Goniometric ROM: ~75    General Comments        Pertinent Vitals/Pain Pain Assessment: 0-10 Pain Score: 10-Worst pain ever Pain Location: Lt knee with knee flexion AAROM exercise Pain Descriptors / Indicators: Aching;Grimacing;Guarding Pain Intervention(s): Limited activity within patient's tolerance;Monitored during session    Home Living                      Prior Function            PT Goals (current  goals can now be found in the care plan section) Acute Rehab PT Goals Patient Stated Goal: be independent again PT Goal Formulation: With patient Time For Goal Achievement: 08/25/15 Potential to Achieve Goals: Good Progress towards PT goals: Progressing toward  goals    Frequency  7X/week    PT Plan Current plan remains appropriate    Co-evaluation             End of Session Equipment Utilized During Treatment: Gait belt Activity Tolerance: Patient tolerated treatment well Patient left: in bed;in CPM;Other (comment);with SCD's reapplied;with bed alarm set;with call bell/phone within reach (in CPM (per pt request, pt declined chair))     Time: [id number] PT Time Calculation (min) (ACUTE ONLY): 26 min  Charges:  $Gait Training: 8-22 mins $Therapeutic Exercise: 8-22 mins                    G Codes:       [name] PT, DPT  Pager: [phone] Phone: [phone] 08/12/2015, 4:11 PM

## 2015-08-12 NOTE — Progress Notes (Signed)
Orthopedic Tech Progress Note Patient Details:  [name] Jan 21, 1950 [id number]  Patient ID: [name], female   DOB: 1950-02-24, 65 y.o.   MRN: [id number] Came to put pt in cpm. Pt was already up in chair. Will call to go in cpm after breakfast.  Karolee Stamps 08/12/2015, 5:43 AM

## 2015-08-12 NOTE — NC FL2 (Signed)
[place] MEDICAID FL2 LEVEL OF CARE SCREENING TOOL     IDENTIFICATION  Patient Name: [name] Birthdate: [birth year] Sex: female Admission Date (Current Location): 08/10/2015  [facility] [place]   Facility and Address:  [facility], [address], [place], [place] [address]      Provider Number: [id number]  Attending Physician Name and Address:  Kathryne Hitchhristopher Y Blackman, *  Relative Name and Phone Number:       Current Level of Care: Hospital Recommended Level of Care: Skilled Nursing Facility Prior Approval Number:    Date Approved/Denied:   PASRR Number:    Discharge Plan: SNF    Current Diagnoses: Patient Active Problem List   Diagnosis Date Noted  . Osteoarthritis of left knee 08/10/2015  . Status post total left knee replacement 08/10/2015  . Osteoarthritis of left hip 11/02/2014  . Status post total replacement of left hip 11/02/2014  . Osteoarthritis of right hip 09/08/2014  . Status post total replacement of right hip 09/08/2014  . PMB (postmenopausal bleeding) 02/28/2013  . Morbid obesity (HCC) 02/28/2013  . Pure hypercholesterolemia 02/28/2013  . Essential hypertension, benign 02/28/2013    Orientation RESPIRATION BLADDER Height & Weight     Self, Time, Situation, Place  Normal Continent Weight: 249 lb (112.9 kg) Height:     BEHAVIORAL SYMPTOMS/MOOD NEUROLOGICAL BOWEL NUTRITION STATUS      Continent    AMBULATORY STATUS COMMUNICATION OF NEEDS Skin   Limited Assist (Patient reports that she uses a walker to ambulate. ) Verbally Normal                       Personal Care Assistance Level of Assistance  Bathing, Dressing Bathing Assistance: Limited assistance   Dressing Assistance: Limited assistance     Functional Limitations Info             SPECIAL CARE FACTORS FREQUENCY                       Contractures      Additional Factors Info                  Current  Medications (08/12/2015):  This is the current hospital active medication list Current Facility-Administered Medications  Medication Dose Route Frequency Provider Last Rate Last Dose  . acetaminophen (TYLENOL) tablet 650 mg  650 mg Oral Q6H PRN Kathryne Hitchhristopher Y Blackman, MD   650 mg at 08/12/15 [id number]   Or  . acetaminophen (TYLENOL) suppository 650 mg  650 mg Rectal Q6H PRN Kathryne Hitchhristopher Y Blackman, MD      . albuterol (PROVENTIL) (2.5 MG/3ML) 0.083% nebulizer solution 2.5 mg  2.5 mg Nebulization Q6H PRN Kathryne Hitchhristopher Y Blackman, MD      . aspirin EC tablet 325 mg  325 mg Oral BID PC Kathryne Hitchhristopher Y Blackman, MD   325 mg at 08/12/15 [phone]  . budesonide (PULMICORT) nebulizer solution 1 mg  1 mg Nebulization Daily PRN Kathryne Hitchhristopher Y Blackman, MD      . cholecalciferol (VITAMIN D) tablet 1,000 Units  1,000 Units Oral BID Kathryne Hitchhristopher Y Blackman, MD   1,000 Units at 08/12/15 [phone]  . diphenhydrAMINE (BENADRYL) 12.5 MG/5ML elixir 12.5-25 mg  12.5-25 mg Oral Q4H PRN Kathryne Hitchhristopher Y Blackman, MD      . docusate sodium (COLACE) capsule 100 mg  100 mg Oral BID Kathryne Hitchhristopher Y Blackman, MD   100 mg at 08/12/15 0842  .  felodipine (PLENDIL) 24 hr tablet 10 mg  10 mg Oral Daily Mcarthur Rossetti, MD   10 mg at 08/12/15 0842  . furosemide (LASIX) tablet 20 mg  20 mg Oral Daily PRN Mcarthur Rossetti, MD      . HYDROcodone-acetaminophen (NORCO/VICODIN) 5-325 MG per tablet 1-2 tablet  1-2 tablet Oral Q4H PRN Mcarthur Rossetti, MD      . HYDROmorphone (DILAUDID) injection 1 mg  1 mg Intravenous Q2H PRN Mcarthur Rossetti, MD      . ipratropium-albuterol (DUONEB) 0.5-2.5 (3) MG/3ML nebulizer solution 3 mL  3 mL Nebulization Q6H PRN Mcarthur Rossetti, MD      . irbesartan (AVAPRO) tablet 300 mg  300 mg Oral Daily Mcarthur Rossetti, MD   300 mg at 08/12/15 0841  . labetalol (NORMODYNE) tablet 400 mg  400 mg Oral BID Mcarthur Rossetti, MD   400 mg at 08/12/15 0842  . lactobacillus acidophilus (BACID)  tablet 2 tablet  2 tablet Oral q morning - 10a Mcarthur Rossetti, MD   2 tablet at 08/12/15 [phone]  . menthol-cetylpyridinium (CEPACOL) lozenge 3 mg  1 lozenge Oral PRN Mcarthur Rossetti, MD       Or  . phenol (CHLORASEPTIC) mouth spray 1 spray  1 spray Mouth/Throat PRN Mcarthur Rossetti, MD      . methocarbamol (ROBAXIN) tablet 500 mg  500 mg Oral Q6H PRN Mcarthur Rossetti, MD   500 mg at 08/12/15 0947   Or  . methocarbamol (ROBAXIN) 500 mg in dextrose 5 % 50 mL IVPB  500 mg Intravenous Q6H PRN Mcarthur Rossetti, MD      . metoCLOPramide (REGLAN) tablet 5-10 mg  5-10 mg Oral Q8H PRN Mcarthur Rossetti, MD       Or  . metoCLOPramide (REGLAN) injection 5-10 mg  5-10 mg Intravenous Q8H PRN Mcarthur Rossetti, MD      . multivitamin with minerals tablet 0.5 tablet  0.5 tablet Oral BID Mcarthur Rossetti, MD   0.5 tablet at 08/12/15 [phone]  . ondansetron (ZOFRAN) tablet 4 mg  4 mg Oral Q6H PRN Mcarthur Rossetti, MD       Or  . ondansetron [facility]) injection 4 mg  4 mg Intravenous Q6H PRN Mcarthur Rossetti, MD      . polyethylene glycol (MIRALAX / GLYCOLAX) packet 17 g  17 g Oral Daily PRN Mcarthur Rossetti, MD      . simvastatin (ZOCOR) tablet 20 mg  20 mg Oral QHS Mcarthur Rossetti, MD   20 mg at 08/11/15 2010  . sorbitol 70 % solution 30 mL  30 mL Oral Daily PRN Mcarthur Rossetti, MD      . vitamin C (ASCORBIC ACID) tablet 500 mg  500 mg Oral Daily Mcarthur Rossetti, MD   500 mg at 08/12/15 0841  . zolpidem (AMBIEN) tablet 5 mg  5 mg Oral QHS PRN Mcarthur Rossetti, MD         Discharge Medications: Please see discharge summary for a list of discharge medications.  Relevant Imaging Results:  Relevant Lab Results:   Additional Information    Venetia Maxon, [place] R

## 2015-08-12 NOTE — Care Management Note (Addendum)
Case Management Note  Patient Details  Name: PRISCELLA NOVELO MRN: [id number] Date of Birth: 1950-09-30  Subjective/Objective:   S/p left TKA                 Action/Plan: Discharge Planning:  Chart reviewed. Requesting SNF-rehab. CSW following for SNF placement.    Expected Discharge Date:  08/13/2015               Expected Discharge Plan:  Skilled Nursing Facility  In-House Referral:  Clinical Social Work  Discharge planning Services  CM Consult  Post Acute Care Choice:  NA Choice offered to:  NA  DME Arranged:  N/A DME Agency:  NA  HH Arranged:  NA HH Agency:  NA  Status of Service:  Completed, signed off  If discussed at [employer] of Stay Meetings, dates discussed:    Additional Comments:  Erenest Rasher, RN 08/12/2015, 11:26 AM

## 2015-08-12 NOTE — Progress Notes (Signed)
Patient has an existing PASRR: [id number] [place], Social Work [phone]

## 2015-08-12 NOTE — Progress Notes (Signed)
Subjective: 2 Days Post-Op Procedure(s) (LRB): LEFT TOTAL KNEE ARTHROPLASTY (Left) Patient reports pain as moderate.    Objective: Vital signs in last 24 hours: Temp:  [98.5 F (36.9 C)-99.8 F (37.7 C)] 98.5 F (36.9 C) (08/10 0543) Pulse Rate:  [67-91] 85 (08/10 0543) Resp:  [16-18] 16 (08/10 0543) BP: (119-161)/(55-61) 119/57 (08/10 0543) SpO2:  [95 %-98 %] 95 % (08/10 0543)  Intake/Output from previous day: 08/09 0701 - 08/10 0700 In: 960 [P.O.:960] Out: -  Intake/Output this shift: Total I/O In: 240 [P.O.:240] Out: -    Recent Labs  08/11/15 0339  HGB 11.5*    Recent Labs  08/11/15 0339  WBC 9.5  RBC 4.15  HCT 36.2  PLT 192    Recent Labs  08/11/15 0339  NA 136  K 3.4*  CL 100*  CO2 24  BUN 9  CREATININE 0.54  GLUCOSE 136*  CALCIUM 8.2*   No results for input(s): LABPT, INR in the last 72 hours.  Sensation intact distally Intact pulses distally Dorsiflexion/Plantar flexion intact Incision: dressing C/D/I No cellulitis present Compartment soft  Assessment/Plan: 2 Days Post-Op Procedure(s) (LRB): LEFT TOTAL KNEE ARTHROPLASTY (Left) Up with therapy Plan for discharge tomorrow Discharge to SNF  [facility] Y 08/12/2015, 6:53 AM

## 2015-08-12 NOTE — Clinical Social Work Note (Signed)
Clinical Social Work Assessment  Patient Details  Name: [name] MRN: [id number] Date of Birth: 1950/03/30  Date of referral:  08/12/15               Reason for consult:  Facility Placement                Permission sought to share information with:   (Facilities) Permission granted to share information::   (Facilities)  Name::        Agency::     Relationship::     Contact Information:     Housing/Transportation Living arrangements for the past 2 months:  Single Family Home (Patient states that she lives home alone in [place].) Source of Information:  Patient Patient Interpreter Needed:  None Criminal Activity/Legal Involvement Pertinent to Current Situation/Hospitalization:  No - Comment as needed Significant Relationships:  Adult Children (Daughter/[name] [phone]) Lives with:  Self Do you feel safe going back to the place where you live?   (Patient would like to go to SNF for rehab.) Need for family participation in patient care:  Yes (Comment) (Patient states that her daughter is her primary support.)  Care giving concerns:  Patient states that prior to her coming into [facility] she was able to complete ADL's independently. However, she states that at this time she feels she needs assistance with ADL's.  Patient is interested in a SNF.  Social Worker assessment / plan:  SW met with patient at bedside. Patient was alert and oriented. There was no family present. Patient was alert and oriented. Patient informed SW that she presents to [facility] hospital due to having a L total knee replacement on Tuesday. Patient reports that she lives home alone in [place]. Patient states that she ambulates using a walker.  Patient states that she is interested in SNF. Patient is accepting with SW referring to multiple facilities. However, she reports that she has already talked to [facility], and they planned to accept her. SW informed pt that she has not heard from [facility], and that  it is not guarantee that that they will accept her. However, she will be notified of all accepting facilities including [facility].  Employment status:  Education officer, museum information:  Medicare PT Recommendations:  [place] / Referral to community resources:   (SW spoke with pt regarding faciliites and referring her to SNF. Patient is accepting. Patient states that she prefers [facility]. SW will reach call faciliity due to that being her first choice.)  Patient/Family's Response to care:  Patient is appropriate at this time.  Patient/Family's Understanding of and Emotional Response to Diagnosis, Current Treatment, and Prognosis:  Patient has no questions for SW at this time.  Emotional Assessment Appearance:  Appears stated age Attitude/Demeanor/Rapport:   (Accepting.) Affect (typically observed):  Accepting, Appropriate Orientation:  Oriented to Self, Oriented to Place, Oriented to  Time, Oriented to Situation Alcohol / Substance use:  Not Applicable Psych involvement (Current and /or in the community):  No (Comment)  Discharge Needs  Concerns to be addressed:  Adjustment to Illness Readmission within the last 30 days:  No Current discharge risk:  None Barriers to Discharge:  No Barriers Identified   Bernita Buffy 08/12/2015, 11:39 AM

## 2015-08-12 NOTE — Discharge Instructions (Signed)

## 2015-08-13 DIAGNOSIS — N950 Postmenopausal bleeding: Secondary | ICD-10-CM | POA: Diagnosis not present

## 2015-08-13 DIAGNOSIS — I10 Essential (primary) hypertension: Secondary | ICD-10-CM | POA: Diagnosis not present

## 2015-08-13 DIAGNOSIS — Z471 Aftercare following joint replacement surgery: Secondary | ICD-10-CM | POA: Diagnosis not present

## 2015-08-13 DIAGNOSIS — E7800 Pure hypercholesterolemia, unspecified: Secondary | ICD-10-CM | POA: Diagnosis not present

## 2015-08-13 DIAGNOSIS — I999 Unspecified disorder of circulatory system: Secondary | ICD-10-CM | POA: Diagnosis not present

## 2015-08-13 DIAGNOSIS — Z96652 Presence of left artificial knee joint: Secondary | ICD-10-CM | POA: Diagnosis not present

## 2015-08-13 DIAGNOSIS — J45909 Unspecified asthma, uncomplicated: Secondary | ICD-10-CM | POA: Diagnosis not present

## 2015-08-13 DIAGNOSIS — M1990 Unspecified osteoarthritis, unspecified site: Secondary | ICD-10-CM | POA: Diagnosis not present

## 2015-08-13 MED ORDER — BUPIVACAINE-EPINEPHRINE (PF) 0.5% -1:200000 IJ SOLN
INTRAMUSCULAR | Status: DC | PRN
  Administered 2015-08-10: 20 mL via PERINEURAL

## 2015-08-13 MED ORDER — CYCLOBENZAPRINE HCL 10 MG PO TABS: 10.0000 mg | ORAL_TABLET | Freq: Two times a day (BID) | ORAL | 0 refills | Status: DC | PRN

## 2015-08-13 MED ORDER — ASPIRIN 325 MG PO TBEC: 325.0000 mg | DELAYED_RELEASE_TABLET | Freq: Two times a day (BID) | ORAL | 0 refills | Status: DC

## 2015-08-13 MED ORDER — HYDROCODONE-ACETAMINOPHEN 5-325 MG PO TABS: 1.0000 | ORAL_TABLET | ORAL | 0 refills | Status: DC | PRN

## 2015-08-13 NOTE — Progress Notes (Signed)
Physical Therapy Treatment Patient Details Name: [name] MRN: [id number] DOB: 1950-07-28 Today's Date: 08/13/2015    History of Present Illness 65 y.o. female now s/p Lt TKA. PMH: bilateral THA, Rt TKA, back surgery, spinal stenosis, hypertension, asthma.     PT Comments    Pt performed increased gait and reviewed stair training.  Pt remains motivated and waiting on skilled nursing placement.    Follow Up Recommendations  SNF;Supervision for mobility/OOB     Equipment Recommendations  None recommended by PT    Recommendations for Other Services       Precautions / Restrictions Precautions Precautions: Fall;Knee Precaution Booklet Issued: Yes (comment) Precaution Comments: reviewed knee extension precautions Restrictions Weight Bearing Restrictions: Yes LLE Weight Bearing: Weight bearing as tolerated    Mobility  Bed Mobility Overal bed mobility: Needs Assistance Bed Mobility: Sit to Supine;Supine to Sit     Supine to sit: Supervision Sit to supine: Supervision   General bed mobility comments: Supervision for safety with increased time and effort  Transfers Overall transfer level: Needs assistance Equipment used: Rolling walker (2 wheeled) Transfers: Sit to/from Stand Sit to Stand: Supervision         General transfer comment: Cues for sequencing and hand placement, no physical assistance needed.  Pt impulsive to stand without RW cues provided for safety.  Ambulation/Gait Ambulation/Gait assistance: Min guard Ambulation Distance (Feet): 160 Feet   Gait Pattern/deviations: Step-through pattern;Decreased stance time - left;Decreased step length - right;Antalgic;Trunk flexed;Shuffle Gait velocity: decreased   General Gait Details: Cues for gait symmetry to soften R heel strike and landing of R foot.  Cues for upper trunk control and cues for forward gaze.  Pt also required cues for L heel strike in stance phase and L knee flexion in swing phase.      Stairs Stairs: Yes Stairs assistance: Min assist Stair Management: One rail Left;Step to pattern;Forwards;With cane Number of Stairs: 8 General stair comments: Pt performed with cane and required cues for sequencing and cane placement.    Wheelchair Mobility    Modified Rankin (Stroke Patients Only)       Balance Overall balance assessment: Needs assistance   Sitting balance-Leahy Scale: Good       Standing balance-Leahy Scale: Poor                      Cognition Arousal/Alertness: Awake/alert Behavior During Therapy: WFL for tasks assessed/performed Overall Cognitive Status: Within Functional Limits for tasks assessed                      Exercises Total Joint Exercises Ankle Circles/Pumps: AROM;Both;10 reps;Supine Quad Sets: Strengthening;10 reps;Supine;AROM;Left Heel Slides: AROM;Left;10 reps;Supine Hip ABduction/ADduction: AROM;Left;10 reps;Supine Straight Leg Raises: AROM;Left;10 reps;Supine Goniometric ROM: 75 degrees flexion.     General Comments        Pertinent Vitals/Pain Pain Assessment: Faces Faces Pain Scale: Hurts a little bit Pain Location: L knee Pain Descriptors / Indicators: Tightness;Sore Pain Intervention(s): Monitored during session;Patient requesting pain meds-RN notified;Repositioned    Home Living                      Prior Function            PT Goals (current goals can now be found in the care plan section) Acute Rehab PT Goals Patient Stated Goal: be independent again Potential to Achieve Goals: Good Progress towards PT goals: Progressing toward goals    Frequency  7X/week    PT Plan Current plan remains appropriate    Co-evaluation             End of Session Equipment Utilized During Treatment: Gait belt Activity Tolerance: Patient tolerated treatment well Patient left: in bed;in CPM;with bed alarm set;with call bell/phone within reach     Time: 1004-1031 PT Time Calculation  (min) (ACUTE ONLY): 27 min  Charges:  $Gait Training: 8-22 mins $Therapeutic Exercise: 8-22 mins                    G Codes:      Cristela Blue 08-29-15, 10:48 AM  Governor Rooks, PTA pager [phone]

## 2015-08-13 NOTE — Progress Notes (Signed)
Patient ID: [name], female   DOB: [birth year], 65 y.o.   MRN: [id number] Doing well overall.  Can be discharged today.

## 2015-08-13 NOTE — Discharge Summary (Signed)
Patient ID: [name] MRN: [id number] DOB/AGE: 1950/02/18 65 y.o.  Admit date: 08/10/2015 Discharge date: 08/13/2015  Admission Diagnoses:  Principal Problem:   Osteoarthritis of left knee Active Problems:   Status post total left knee replacement   Discharge Diagnoses:  Same  Past Medical History:  Diagnosis Date  . Abnormal Pap smear of cervix 03/2009   Colpo Biopsy CIN I with HPV  . Arthritis   . Asthma   . Bilateral edema of lower extremity    Takes lasix if needed  . Bronchitis   . Disc disorder    bulging disc in thoracic area  . GERD (gastroesophageal reflux disease)    occ  . Hypercholesteremia   . Hypertension since 1990's  . Lumbar herniated disc   . Obesity   . Pneumonia    hx  . Scoliosis   . Spinal stenosis   . Teeth grinding     Surgeries: Procedure(s): LEFT TOTAL KNEE ARTHROPLASTY on 08/10/2015   Consultants:   Discharged Condition: Improved  Hospital Course: [name] is an 65 y.o. female who was admitted 08/10/2015 for operative treatment ofOsteoarthritis of left knee. Patient has severe unremitting pain that affects sleep, daily activities, and work/hobbies. After pre-op clearance the patient was taken to the operating room on 08/10/2015 and underwent  Procedure(s): LEFT TOTAL KNEE ARTHROPLASTY.    Patient was given perioperative antibiotics: Anti-infectives    Start     Dose/Rate Route Frequency Ordered Stop   08/10/15 1800  ceFAZolin (ANCEF) IVPB 2g/100 mL premix     2 g 200 mL/hr over 30 Minutes Intravenous Every 6 hours 08/10/15 1712 08/11/15 0040   08/10/15 0956  ceFAZolin (ANCEF) 2-4 GM/100ML-% IVPB    Comments:  Merryl Hacker   : cabinet override      08/10/15 0956 08/10/15 2159   08/10/15 0955  ceFAZolin (ANCEF) IVPB 2g/100 mL premix     2 g 200 mL/hr over 30 Minutes Intravenous On call to O.R. 08/10/15 0955 08/10/15 1233       Patient was given sequential compression devices, early ambulation, and chemoprophylaxis to  prevent DVT.  Patient benefited maximally from hospital stay and there were no complications.    Recent vital signs: Patient Vitals for the past 24 hrs:  BP Temp Temp src Pulse Resp SpO2  08/13/15 0538 133/69 98.1 F (36.7 C) Oral 88 16 98 %  08/13/15 0101 - 98.8 F (37.1 C) Oral - - -  08/12/15 2252 (!) 142/58 100.2 F (37.9 C) Oral 89 16 97 %     Recent laboratory studies:  Recent Labs  08/11/15 0339  WBC 9.5  HGB 11.5*  HCT 36.2  PLT 192  NA 136  K 3.4*  CL 100*  CO2 24  BUN 9  CREATININE 0.54  GLUCOSE 136*  CALCIUM 8.2*     Discharge Medications:     Medication List    TAKE these medications   aspirin 325 MG EC tablet Take 1 tablet (325 mg total) by mouth 2 (two) times daily after a meal.   budesonide 1 MG/2ML nebulizer solution Commonly known as:  PULMICORT Take 1 mg by nebulization daily as needed (for wheezing or shortness of breath).   cholecalciferol 1000 units tablet Commonly known as:  VITAMIN D Take 1,000 Units by mouth 2 (two) times daily.   Cinnamon 500 MG capsule Take 500 mg by mouth 2 (two) times daily.   CoQ10 200 MG Caps Take 200 mg by mouth at  bedtime.   cyclobenzaprine 10 MG tablet Commonly known as:  FLEXERIL Take 1 tablet (10 mg total) by mouth 2 (two) times daily as needed for muscle spasms.   diclofenac 75 MG EC tablet Commonly known as:  VOLTAREN Take 1 tablet (75 mg total) by mouth 2 (two) times daily as needed for mild pain.   docusate sodium 100 MG capsule Commonly known as:  COLACE Take 200 mg by mouth at bedtime.   DUONEB 0.5-2.5 (3) MG/3ML Soln Generic drug:  ipratropium-albuterol Take 3 mLs by nebulization every 6 (six) hours as needed (shortness of breath).   felodipine 10 MG 24 hr tablet Commonly known as:  PLENDIL Take 10 mg by mouth daily.   FLAXSEED (LINSEED) PO Take 1,300 mg by mouth daily.   Garlic 123XX123 MG Caps Take 1,000 mg by mouth daily.   Grape Seed 50 MG Tabs Take 50 mg by mouth daily.    HYDROcodone-acetaminophen 5-325 MG tablet Commonly known as:  NORCO/VICODIN Take 1-2 tablets by mouth every 4 (four) hours as needed for moderate pain.   irbesartan 300 MG tablet Commonly known as:  AVAPRO Take 300 mg by mouth daily.   labetalol 200 MG tablet Commonly known as:  NORMODYNE Take 400 mg by mouth 2 (two) times daily.   lactobacillus acidophilus Tabs tablet Take 2 tablets by mouth every morning.   LASIX 20 MG tablet Generic drug:  furosemide Take 20-30 mg by mouth daily as needed (for leg swelling).   Lecithin 1200 MG Caps Take 1,200 mg by mouth daily.   LIDAZONE HC 3-0.5 % Crea Generic drug:  lidocaine-hydrocortisone Place 1 Applicatorful rectally as needed (hemorrhoids).   Lutein 20 MG Tabs Take 20 mg by mouth daily.   miconazole 2 % powder Commonly known as:  MICOTIN Apply topically as needed for itching (also uses cream).   multivitamin with minerals Tabs tablet Take 0.5 tablets by mouth 2 (two) times daily.   Omega 3 1200 MG Caps Take 1,200 mg by mouth 2 (two) times daily.   PROAIR HFA 108 (90 Base) MCG/ACT inhaler Generic drug:  albuterol Inhale 2 puffs into the lungs every 6 (six) hours as needed for wheezing or shortness of breath.   albuterol 0.63 MG/3ML nebulizer solution Commonly known as:  ACCUNEB Take 1 ampule by nebulization every 6 (six) hours as needed for wheezing or shortness of breath.   simvastatin 20 MG tablet Commonly known as:  ZOCOR Take 20 mg by mouth at bedtime.   triamcinolone cream 0.1 % Commonly known as:  KENALOG Apply 1 application topically 2 (two) times daily as needed (rash).   vitamin C 500 MG tablet Commonly known as:  ASCORBIC ACID Take 500 mg by mouth daily.       Diagnostic Studies: Dg Knee Left Port  Result Date: 08/10/2015 CLINICAL DATA:  Status post knee replacement. EXAM: PORTABLE LEFT KNEE - 1-2 VIEW COMPARISON:  April 26, 2015 FINDINGS: The patient is status post left knee replacement. Hardware  is in good position. Postoperative air and swelling identified. No acute abnormalities. IMPRESSION: No acute abnormalities after knee replacement. Electronically Signed   By: Dorise Bullion III M.D   On: 08/10/2015 15:35    Disposition:  To skilled nursing facility  Discharge Instructions    Call MD / Call 911    Complete by:  As directed   If you experience chest pain or shortness of breath, CALL 911 and be transported to the hospital emergency room.  If you develope  a fever above 101 F, pus (white drainage) or increased drainage or redness at the wound, or calf pain, call your surgeon's office.   Constipation Prevention    Complete by:  As directed   Drink plenty of fluids.  Prune juice may be helpful.  You may use a stool softener, such as Colace (over the counter) 100 mg twice a day.  Use MiraLax (over the counter) for constipation as needed.   Diet - low sodium heart healthy    Complete by:  As directed   Discharge patient    Complete by:  As directed   Increase activity slowly as tolerated    Complete by:  As directed      Follow-up Information    Mcarthur Rossetti, MD Follow up in 2 week(s).   Specialty:  Orthopedic Surgery Contact information: [place] [address] [phone]            Signed: Mcarthur Rossetti 08/13/2015, 7:02 AM

## 2015-08-13 NOTE — Anesthesia Procedure Notes (Addendum)
Anesthesia Regional Block:  Adductor canal block  Pre-Anesthetic Checklist: ,, timeout performed, Correct Patient, Correct Site, Correct Laterality, Correct Procedure, Correct Position, site marked, Risks and benefits discussed,  Surgical consent,  Pre-op evaluation,  At surgeon's request and post-op pain management  Laterality: Lower and Left  Prep: chloraprep       Needles:  Injection technique: Single-shot  Needle Type: Echogenic Stimulator Needle          Additional Needles:  Procedures: ultrasound guided (picture in chart) Adductor canal block Narrative:  Injection made incrementally with aspirations every 5 mL.  Performed by: Personally  Anesthesiologist: Lylian Sanagustin  Additional Notes: H+P and labs reviewed, risks and benefits discussed with patient, procedure tolerated well without complications      

## 2015-08-13 NOTE — Addendum Note (Signed)
Addendum  created 08/13/15 1136 by Oleta Mouse, MD   Anesthesia Intra Blocks edited, Anesthesia Intra Meds edited, Child order released for a procedure order, Sign clinical note

## 2015-08-16 DIAGNOSIS — J45909 Unspecified asthma, uncomplicated: Secondary | ICD-10-CM | POA: Diagnosis not present

## 2015-08-16 DIAGNOSIS — I999 Unspecified disorder of circulatory system: Secondary | ICD-10-CM | POA: Diagnosis not present

## 2015-08-16 DIAGNOSIS — I10 Essential (primary) hypertension: Secondary | ICD-10-CM | POA: Diagnosis not present

## 2015-08-23 ENCOUNTER — Ambulatory Visit: Payer: Medicare Other | Attending: Orthopaedic Surgery

## 2015-08-23 DIAGNOSIS — M25562 Pain in left knee: Secondary | ICD-10-CM | POA: Diagnosis not present

## 2015-08-23 DIAGNOSIS — R600 Localized edema: Secondary | ICD-10-CM | POA: Insufficient documentation

## 2015-08-23 DIAGNOSIS — R2689 Other abnormalities of gait and mobility: Secondary | ICD-10-CM | POA: Diagnosis not present

## 2015-08-23 DIAGNOSIS — M6281 Muscle weakness (generalized): Secondary | ICD-10-CM | POA: Insufficient documentation

## 2015-08-23 NOTE — Therapy (Signed)
[facility] [address] [place], [place], [address] Phone: [phone]   Fax:  [phone]  Physical Therapy Evaluation  Patient Details  Name: [name] MRN: [id number] Date of Birth: 09/20/1950 Referring Provider: Cristal Deer, MD  Encounter Date: 08/23/2015      PT End of Session - 08/23/15 1429    Visit Number 1   Number of Visits 10   Date for PT Re-Evaluation 10/18/15   PT Start Time 1401   PT Stop Time 1450   PT Time Calculation (min) 49 min   Activity Tolerance Patient tolerated treatment well   Behavior During Therapy [facility] for tasks assessed/performed      Past Medical History:  Diagnosis Date  . Abnormal Pap smear of cervix 03/2009   Colpo Biopsy CIN I with HPV  . Arthritis   . Asthma   . Bilateral edema of lower extremity    Takes lasix if needed  . Bronchitis   . Disc disorder    bulging disc in thoracic area  . GERD (gastroesophageal reflux disease)    occ  . Hypercholesteremia   . Hypertension since 1990's  . Lumbar herniated disc   . Obesity   . Pneumonia    hx  . Scoliosis   . Spinal stenosis   . Teeth grinding     Past Surgical History:  Procedure Laterality Date  . APPENDECTOMY  1965  . BACK SURGERY  1993   Thoracic and Lumbar  . BREAST BIOPSY Right 1990   benign cyst  . CERVICAL [place] SURGERY  2007  . COLONOSCOPY W/ POLYPECTOMY    . [place], as child   bilateral inguinal -1 during pregnancy.  Umbilical repair as child  . KNEE ARTHROSCOPY Right 1990  . [place] SURGERY  2006  . SHOULDER ARTHROSCOPY Left 1994   arthritis, hips,fingers  . TONSILLECTOMY AND ADENOIDECTOMY    . TOTAL HIP ARTHROPLASTY Right 09/08/2014   Procedure: RIGHT TOTAL HIP ARTHROPLASTY ANTERIOR APPROACH;  Surgeon: Mcarthur Rossetti, MD;  Location: [facility];  Service: Orthopedics;  Laterality: Right;  . TOTAL HIP ARTHROPLASTY Left 11/02/2014   Procedure: LEFT TOTAL HIP  ARTHROPLASTY ANTERIOR APPROACH;  Surgeon: Mcarthur Rossetti, MD;  Location: [place];  Service: Orthopedics;  Laterality: Left;  . TOTAL KNEE ARTHROPLASTY Right 2009  . TOTAL KNEE ARTHROPLASTY Left 08/10/2015  . TOTAL KNEE ARTHROPLASTY Left 08/10/2015   Procedure: LEFT TOTAL KNEE ARTHROPLASTY;  Surgeon: Mcarthur Rossetti, MD;  Location: [facility];  Service: Orthopedics;  Laterality: Left;    There were no vitals filed for this visit.       Subjective Assessment - 08/23/15 1406    Subjective Pt reports to PT s/p Lt TKA performed 08/10/15.  Pt had a stay in a skilled nursing facility after surgery.  Staples still present.   Pertinent History Hip replacement surgery: 09/08/14, 11/02/14, Lt TKA 08/10/15   Limitations Standing;Walking   How long can you stand comfortably? 10 minutes max   How long can you walk comfortably? with cane-5 minutes   Patient Stated Goals reduce pain, improve mobility, increase strength   Currently in Pain? Yes   Pain Score 4    Pain Location Knee   Pain Orientation Left   Pain Descriptors / Indicators Aching;Tightness   Pain Type Surgical pain   Pain Onset 1 to 4 weeks ago   Pain Frequency Constant   Aggravating Factors  standing, walking, bending the knee  Pain Relieving Factors ice, pain meds, non weightbearing            [facility] PT Assessment - 08/23/15 0001      Assessment   Medical Diagnosis s/p Lt TKA   Referring Provider Cristal Deer, MD   Onset Date/Surgical Date 08/10/15   Next MD Visit 08/25/15   Prior Therapy skilled nursing facility     Precautions   Precautions None     Restrictions   Weight Bearing Restrictions No     Balance Screen   Has the patient fallen in the past 6 months No   Has the patient had a decrease in activity level because of a fear of falling?  No   Is the patient reluctant to leave their home because of a fear of falling?  No     Home Environment   Living Environment Private residence   Type of [place] to enter   Entrance Stairs-Number of Steps 7   Home Layout Two level   [place] Bedside commode;Walker - standard;Toilet riser;Tub bench;Walker - 2 wheels     Prior Function   Level of Independence Independent   Vocation Retired   Leisure gardening, Haematologist, reading, Quarry manager, tutoring     Cognition   Overall Cognitive Status Within Functional Limits for tasks assessed     Observation/Other Assessments   Focus on Therapeutic Outcomes (FOTO)  66% limitation     Posture/Postural Control   Posture/Postural Control No significant limitations     ROM / Strength   AROM / PROM / Strength AROM;PROM;Strength     AROM   Overall AROM  Deficits   Overall AROM Comments Rt knee 0-105   AROM Assessment Site Knee   Right/Left Knee Left   Left Knee Extension 4   Left Knee Flexion 90     PROM   Overall PROM  Deficits   PROM Assessment Site Knee   Right/Left Knee Left   Left Knee Extension 3   Left Knee Flexion 91     Strength   Overall Strength Deficits   Overall Strength Comments Rt hip and knee 4+/5   Strength Assessment Site Knee   Right/Left Knee Left   Left Knee Flexion 4+/5   Left Knee Extension 4/5     Palpation   Patella mobility Not able to assess as bandaging still present   Palpation comment bandaging present with staples under bandage.  warmth and redness around incision.  Edema present.       Transfers   Transfers Sit to Stand;Stand to Sit   Sit to Stand 6: Modified independent (Device/Increase time);With upper extremity assist   Stand to Sit 6: Modified independent (Device/Increase time);With upper extremity assist     Ambulation/Gait   Ambulation/Gait Yes   Ambulation/Gait Assistance 6: Modified independent (Device/Increase time)   Ambulation Distance (Feet) 100 Feet   Assistive device Standard walker   Gait Pattern Step-through pattern;Decreased stance time - left;Decreased step length - left   Stairs Yes   Stair Management  Technique One rail Right;Step to pattern                   [facility] Adult PT Treatment/Exercise - 08/23/15 0001      Exercises   Exercises Knee/Hip     Knee/Hip Exercises: Aerobic   Nustep Level 1x 10 minutes  arms only     Modalities   Modalities Vasopneumatic     Vasopneumatic   Number  Minutes Vasopneumatic  15 minutes   Vasopnuematic Location  Knee   Vasopneumatic Pressure Medium   Vasopneumatic Temperature  3 snowflakes                  PT Short Term Goals - 08/23/15 1432      PT SHORT TERM GOAL #1   Title be independent in initial HEP   Time 4   Period Weeks   Status New     PT SHORT TERM GOAL #2   Title report < or = to 4/10 Lt knee pain with standing activity   Time 4   Period Weeks   Status New     PT SHORT TERM GOAL #3   Title stand for 15 minutes for home tasks without need to rest   Time 4   Period Weeks   Status New     PT SHORT TERM GOAL #4   Title improve strength to ascend steps with step-over-step gait > or = to 50% of the time   Time 4   Period Weeks   Status New     PT SHORT TERM GOAL #5   Title improve strength to wean from cane 50% of the time in the home and community.     Time 4   Period Weeks   Status New           PT Long Term Goals - 08/23/15 1435      PT LONG TERM GOAL #1   Title be independent in advanced HEP   Time 8   Period Weeks   Status New     PT LONG TERM GOAL #2   Title reduce FOTO to < or = to 53% limitation   Time 8   Period Weeks   Status New     PT LONG TERM GOAL #3   Title demonstrate Lt knee AROM flexion to > or = to 100 degrees to improve steps and squatting   Time 8   Period Weeks   Status New     PT LONG TERM GOAL #4   Title improve strength to ascend steps with step-over-step > or = to 75% of the time   Time 8   Period Weeks   Status New     PT LONG TERM GOAL #5   Title wean from cane > or = to 75% of the time for home and community distances   Time 8   Period Weeks    Status New     Additional Long Term Goals   Additional Long Term Goals Yes     PT LONG TERM GOAL #6   Title reduce Lt knee pain to < or = to 2/10 with standing tasks   Time 8   Period Weeks   Status New               Plan - 08/23/15 1429    Clinical Impression Statement Pt presents to PT s/p Lt TKA 08/10/15.  Pt is moderate complexity due to comorbidities including recent lumbar surgeries and bil. hip replacements within the past year and evolving condition.  Pt demonstates gait abnormality, step-to gait with negotiating steps, limited ambulation distance, limited Lt LE AROM and strength and edema.  Pt will benefit from skilled PT for Lt knee strength, AROM, edema management and gait training.      Rehab Potential Good   PT Frequency 2x / week   PT Duration 8 weeks   PT Treatment/Interventions  ADLs/Self Care Home Management;Cryotherapy;Electrical Stimulation;Functional mobility training;Stair training;Gait training;Ultrasound;Therapeutic activities;Therapeutic exercise;Balance training;Neuromuscular re-education;Patient/family education;Manual techniques;Passive range of motion;Vasopneumatic Device;Taping   PT Next Visit Plan Lt knee ROM, strength, gait, edema management   Consulted and Agree with Plan of Care Patient      Patient will benefit from skilled therapeutic intervention in order to improve the following deficits and impairments:  Abnormal gait, Pain, Increased edema, Decreased strength, Decreased mobility, Decreased scar mobility, Impaired flexibility, Decreased activity tolerance, Decreased endurance, Decreased range of motion, Difficulty walking  Visit Diagnosis: Pain in left knee - Plan: PT plan of care cert/re-cert  Localized edema - Plan: PT plan of care cert/re-cert  Other abnormalities of gait and mobility - Plan: PT plan of care cert/re-cert  Muscle weakness (generalized) - Plan: PT plan of care cert/re-cert      G-Codes - [id number]    Functional  Assessment Tool Used FOTO: 66% limitation   Functional Limitation Mobility: Walking and moving around   Mobility: Walking and Moving Around Current Status [phone]) At least 60 percent but less than 80 percent impaired, limited or restricted   Mobility: Walking and Moving Around Goal Status [phone]) At least 40 percent but less than 60 percent impaired, limited or restricted       Problem List Patient Active Problem List   Diagnosis Date Noted  . Osteoarthritis of left knee 08/10/2015  . Status post total left knee replacement 08/10/2015  . Osteoarthritis of left hip 11/02/2014  . Status post total replacement of left hip 11/02/2014  . Osteoarthritis of right hip 09/08/2014  . Status post total replacement of right hip 09/08/2014  . PMB (postmenopausal bleeding) 02/28/2013  . Morbid obesity ([place]) 02/28/2013  . Pure hypercholesterolemia 02/28/2013  . Essential hypertension, benign 02/28/2013     Sigurd Sos, PT 08/23/15 2:41 PM  [facility] [address] [place], [place], [address] Phone: [phone]   Fax:  [phone]  Name: KELI ATCHESON MRN: [id number] Date of Birth: 07/24/1950

## 2015-08-24 DIAGNOSIS — M1712 Unilateral primary osteoarthritis, left knee: Secondary | ICD-10-CM | POA: Diagnosis not present

## 2015-08-26 ENCOUNTER — Encounter: Payer: Self-pay | Admitting: Physical Therapy

## 2015-08-26 ENCOUNTER — Ambulatory Visit: Payer: Medicare Other | Admitting: Physical Therapy

## 2015-08-26 DIAGNOSIS — R600 Localized edema: Secondary | ICD-10-CM

## 2015-08-26 DIAGNOSIS — M6281 Muscle weakness (generalized): Secondary | ICD-10-CM

## 2015-08-26 DIAGNOSIS — M25562 Pain in left knee: Secondary | ICD-10-CM

## 2015-08-26 DIAGNOSIS — R2689 Other abnormalities of gait and mobility: Secondary | ICD-10-CM | POA: Diagnosis not present

## 2015-08-26 NOTE — Therapy (Signed)
[facility] [address] [place], [place], [address] Phone: [phone]   Fax:  [phone]  Physical Therapy Treatment  Patient Details  Name: [name] MRN: [id number] Date of Birth: 1950/09/23 Referring Provider: Cristal Deer, MD  Encounter Date: 08/26/2015      PT End of Session - 08/26/15 1657    Visit Number 2   Number of Visits 10   Date for PT Re-Evaluation 10/18/15   PT Start Time [id number]   PT Stop Time [id number]   PT Time Calculation (min) 60 min   Activity Tolerance Patient tolerated treatment well   Behavior During Therapy [facility] for tasks assessed/performed      Past Medical History:  Diagnosis Date  . Abnormal Pap smear of cervix 03/2009   Colpo Biopsy CIN I with HPV  . Arthritis   . Asthma   . Bilateral edema of lower extremity    Takes lasix if needed  . Bronchitis   . Disc disorder    bulging disc in thoracic area  . GERD (gastroesophageal reflux disease)    occ  . Hypercholesteremia   . Hypertension since 1990's  . Lumbar herniated disc   . Obesity   . Pneumonia    hx  . Scoliosis   . Spinal stenosis   . Teeth grinding     Past Surgical History:  Procedure Laterality Date  . APPENDECTOMY  1965  . BACK SURGERY  1993   Thoracic and Lumbar  . BREAST BIOPSY Right 1990   benign cyst  . CERVICAL [place] SURGERY  2007  . COLONOSCOPY W/ POLYPECTOMY    . [place], as child   bilateral inguinal -1 during pregnancy.  Umbilical repair as child  . KNEE ARTHROSCOPY Right 1990  . [place] SURGERY  2006  . SHOULDER ARTHROSCOPY Left 1994   arthritis, hips,fingers  . TONSILLECTOMY AND ADENOIDECTOMY    . TOTAL HIP ARTHROPLASTY Right 09/08/2014   Procedure: RIGHT TOTAL HIP ARTHROPLASTY ANTERIOR APPROACH;  Surgeon: Mcarthur Rossetti, MD;  Location: [place];  Service: Orthopedics;  Laterality: Right;  . TOTAL HIP ARTHROPLASTY Left 11/02/2014   Procedure: LEFT TOTAL HIP  ARTHROPLASTY ANTERIOR APPROACH;  Surgeon: Mcarthur Rossetti, MD;  Location: [place];  Service: Orthopedics;  Laterality: Left;  . TOTAL KNEE ARTHROPLASTY Right 2009  . TOTAL KNEE ARTHROPLASTY Left 08/10/2015  . TOTAL KNEE ARTHROPLASTY Left 08/10/2015   Procedure: LEFT TOTAL KNEE ARTHROPLASTY;  Surgeon: Mcarthur Rossetti, MD;  Location: [place];  Service: Orthopedics;  Laterality: Left;    There were no vitals filed for this visit.      Subjective Assessment - 08/26/15 1614    Subjective Pt reports knee doing well today some stiffness. Staple have been removed.    Patient is accompained by: Family member   Pertinent History Hip replacement surgery: 09/08/14, 11/02/14, Lt TKA 08/10/15   Limitations Standing;Walking   Pain Score 4    Pain Location Knee   Pain Orientation Left   Pain Descriptors / Indicators Aching;Tightness   Pain Type Surgical pain   Pain Onset 1 to 4 weeks ago   Pain Frequency Constant   Aggravating Factors  Standing, walking, bending   Pain Relieving Factors Ice, pain meds   Multiple Pain Sites No                         [facility] Adult PT Treatment/Exercise - 08/26/15 0001  Knee/Hip Exercises: Aerobic   Nustep Level 1x 10 minutes  arms only     Knee/Hip Exercises: Standing   Knee Flexion AROM;Strengthening;Both;2 sets;10 reps  #3   Hip Flexion AROM;Stengthening;Both;2 sets;10 reps   Hip Abduction AROM;Stengthening;Both;2 sets;10 reps   Hip Extension AROM;Both;Stengthening;10 reps;2 sets   Functional Squat 2 sets;10 reps  Mini squats     Knee/Hip Exercises: Seated   Long Arc Quad AROM;Strengthening;Left;2 sets;Weights;20 reps   Long Arc Quad Weight 3 lbs.   Ball Squeeze 30   Clamshell with TheraBand Red   Hamstring Curl AAROM;Strengthening;Left;2 sets;20 reps     Knee/Hip Exercises: Supine   Quad Sets AROM;Strengthening;Left;2 sets;20 reps     Modalities   Modalities Vasopneumatic     Vasopneumatic   Number Minutes  Vasopneumatic  15 minutes   Vasopnuematic Location  Knee   Vasopneumatic Pressure Medium   Vasopneumatic Temperature  3 snowflakes     Manual Therapy   Manual Therapy --   Manual therapy comments --                  PT Short Term Goals - 08/23/15 1432      PT SHORT TERM GOAL #1   Title be independent in initial HEP   Time 4   Period Weeks   Status New     PT SHORT TERM GOAL #2   Title report < or = to 4/10 Lt knee pain with standing activity   Time 4   Period Weeks   Status New     PT SHORT TERM GOAL #3   Title stand for 15 minutes for home tasks without need to rest   Time 4   Period Weeks   Status New     PT SHORT TERM GOAL #4   Title improve strength to ascend steps with step-over-step gait > or = to 50% of the time   Time 4   Period Weeks   Status New     PT SHORT TERM GOAL #5   Title improve strength to wean from cane 50% of the time in the home and community.     Time 4   Period Weeks   Status New           PT Long Term Goals - 08/23/15 1435      PT LONG TERM GOAL #1   Title be independent in advanced HEP   Time 8   Period Weeks   Status New     PT LONG TERM GOAL #2   Title reduce FOTO to < or = to 53% limitation   Time 8   Period Weeks   Status New     PT LONG TERM GOAL #3   Title demonstrate Lt knee AROM flexion to > or = to 100 degrees to improve steps and squatting   Time 8   Period Weeks   Status New     PT LONG TERM GOAL #4   Title improve strength to ascend steps with step-over-step > or = to 75% of the time   Time 8   Period Weeks   Status New     PT LONG TERM GOAL #5   Title wean from cane > or = to 75% of the time for home and community distances   Time 8   Period Weeks   Status New     Additional Long Term Goals   Additional Long Term Goals Yes     PT LONG  TERM GOAL #6   Title reduce Lt knee pain to < or = to 2/10 with standing tasks   Time 8   Period Weeks   Status New               Plan -  08/26/15 1634    Clinical Impression Statement Pt presents to clinic with antalgic gait using straight cane. Pt has had multiple joint replacments over the past few years and is familiar with most exercises. Has no hip precautions. Able to tolerate standing strengthening exercises well. Continue to strengthen and stabilixe Bil LE and increase ROM.    Rehab Potential Good   PT Frequency 2x / week   PT Duration 8 weeks   PT Treatment/Interventions ADLs/Self Care Home Management;Cryotherapy;Electrical Stimulation;Functional mobility training;Stair training;Gait training;Ultrasound;Therapeutic activities;Therapeutic exercise;Balance training;Neuromuscular re-education;Patient/family education;Manual techniques;Passive range of motion;Vasopneumatic Device;Taping   PT Next Visit Plan Lt knee standing and strengthening exercises, gait training, edema management    Consulted and Agree with Plan of Care Patient      Patient will benefit from skilled therapeutic intervention in order to improve the following deficits and impairments:  Abnormal gait, Pain, Increased edema, Decreased strength, Decreased mobility, Decreased scar mobility, Impaired flexibility, Decreased activity tolerance, Decreased endurance, Decreased range of motion, Difficulty walking  Visit Diagnosis: Pain in left knee  Localized edema  Other abnormalities of gait and mobility  Muscle weakness (generalized)     Problem List Patient Active Problem List   Diagnosis Date Noted  . Osteoarthritis of left knee 08/10/2015  . Status post total left knee replacement 08/10/2015  . Osteoarthritis of left hip 11/02/2014  . Status post total replacement of left hip 11/02/2014  . Osteoarthritis of right hip 09/08/2014  . Status post total replacement of right hip 09/08/2014  . PMB (postmenopausal bleeding) 02/28/2013  . Morbid obesity ([place]) 02/28/2013  . Pure hypercholesterolemia 02/28/2013  . Essential hypertension, benign 02/28/2013     Mikle Bosworth PTA 08/26/2015, 4:59 PM  [facility] [address], [place], [place], [address] Phone: [phone]   Fax:  [phone]  Name: [name] MRN: [id number] Date of Birth: July 21, 1950

## 2015-08-30 ENCOUNTER — Encounter: Payer: Self-pay | Admitting: Physical Therapy

## 2015-08-30 ENCOUNTER — Ambulatory Visit: Payer: Medicare Other | Admitting: Physical Therapy

## 2015-08-30 DIAGNOSIS — R600 Localized edema: Secondary | ICD-10-CM | POA: Diagnosis not present

## 2015-08-30 DIAGNOSIS — M25562 Pain in left knee: Secondary | ICD-10-CM | POA: Diagnosis not present

## 2015-08-30 DIAGNOSIS — R2689 Other abnormalities of gait and mobility: Secondary | ICD-10-CM | POA: Diagnosis not present

## 2015-08-30 DIAGNOSIS — M6281 Muscle weakness (generalized): Secondary | ICD-10-CM

## 2015-08-30 NOTE — Therapy (Signed)
[facility] [address], [place], [place], [address] Phone: [phone]   Fax:  [phone]  Physical Therapy Treatment  Patient Details  Name: [name] MRN: [id number] Date of Birth: Jan 28, 1950 Referring Provider: Cristal Deer, MD  Encounter Date: 08/30/2015      PT End of Session - 08/30/15 1140    Visit Number 3   Number of Visits 10   Date for PT Re-Evaluation 10/18/15   PT Start Time 1100   PT Stop Time 1205   PT Time Calculation (min) 65 min   Activity Tolerance Patient tolerated treatment well   Behavior During Therapy [facility] for tasks assessed/performed      Past Medical History:  Diagnosis Date  . Abnormal Pap smear of cervix 03/2009   Colpo Biopsy CIN I with HPV  . Arthritis   . Asthma   . Bilateral edema of lower extremity    Takes lasix if needed  . Bronchitis   . Disc disorder    bulging disc in thoracic area  . GERD (gastroesophageal reflux disease)    occ  . Hypercholesteremia   . Hypertension since 1990's  . Lumbar herniated disc   . Obesity   . Pneumonia    hx  . Scoliosis   . Spinal stenosis   . Teeth grinding     Past Surgical History:  Procedure Laterality Date  . APPENDECTOMY  1965  . BACK SURGERY  1993   Thoracic and Lumbar  . BREAST BIOPSY Right 1990   benign cyst  . CERVICAL [place] SURGERY  2007  . COLONOSCOPY W/ POLYPECTOMY    . [place], as child   bilateral inguinal -1 during pregnancy.  Umbilical repair as child  . KNEE ARTHROSCOPY Right 1990  . [place] SURGERY  2006  . SHOULDER ARTHROSCOPY Left 1994   arthritis, hips,fingers  . TONSILLECTOMY AND ADENOIDECTOMY    . TOTAL HIP ARTHROPLASTY Right 09/08/2014   Procedure: RIGHT TOTAL HIP ARTHROPLASTY ANTERIOR APPROACH;  Surgeon: Mcarthur Rossetti, MD;  Location: [place];  Service: Orthopedics;  Laterality: Right;  . TOTAL HIP ARTHROPLASTY Left 11/02/2014   Procedure: LEFT TOTAL HIP  ARTHROPLASTY ANTERIOR APPROACH;  Surgeon: Mcarthur Rossetti, MD;  Location: [place];  Service: Orthopedics;  Laterality: Left;  . TOTAL KNEE ARTHROPLASTY Right 2009  . TOTAL KNEE ARTHROPLASTY Left 08/10/2015  . TOTAL KNEE ARTHROPLASTY Left 08/10/2015   Procedure: LEFT TOTAL KNEE ARTHROPLASTY;  Surgeon: Mcarthur Rossetti, MD;  Location: [place];  Service: Orthopedics;  Laterality: Left;    There were no vitals filed for this visit.      Subjective Assessment - 08/30/15 1104    Subjective Pt reports Rt hip pain today with rotation. Some stiffness in Rt knee after pt has done some exercises this morning    Patient is accompained by: Family member   Pertinent History Hip replacement surgery: 09/08/14, 11/02/14, Lt TKA 08/10/15   Limitations Standing;Walking   How long can you stand comfortably? 10 minutes max   How long can you walk comfortably? with cane-5 minutes   Patient Stated Goals reduce pain, improve mobility, increase strength   Currently in Pain? Yes   Pain Score 5   Rt hip pain as well   Pain Location Knee   Pain Orientation Left   Pain Type Surgical pain   Pain Onset 1 to 4 weeks ago   Pain Frequency Constant   Aggravating Factors  Standing walking  bending   Pain Relieving Factors Ice pain meds   Multiple Pain Sites No            [facility] PT Assessment - 08/30/15 0001      PROM   Left Knee Flexion 94                     [facility] Adult PT Treatment/Exercise - 08/30/15 0001      Knee/Hip Exercises: Aerobic   Nustep Level 1x 10 minutes  arms only     Knee/Hip Exercises: Standing   Hip Flexion AROM;Stengthening;Both;2 sets;10 reps   Hip Extension AROM;Both;Stengthening;10 reps;2 sets   Functional Squat --  Mini squats     Knee/Hip Exercises: Seated   Long Arc Quad AROM;Strengthening;Left;2 sets;Weights;20 reps   [facility] 30   Clamshell with TheraBand Red   Hamstring Curl AAROM;Strengthening;Left;2 sets;20 reps     Manual Therapy   Manual  Therapy Soft tissue mobilization;Passive ROM   Manual therapy comments to Rt knee distal quads and hamstrings Rt hip    Passive ROM Knee flexion and extension                  PT Short Term Goals - 08/30/15 1108      PT SHORT TERM GOAL #1   Title be independent in initial HEP   Time 4   Period Weeks   Status Achieved     PT SHORT TERM GOAL #2   Title report < or = to 4/10 Lt knee pain with standing activity   Time 4   Period Weeks   Status On-going     PT SHORT TERM GOAL #3   Title stand for 15 minutes for home tasks without need to rest   Time 4   Period Weeks   Status On-going     PT SHORT TERM GOAL #4   Title improve strength to ascend steps with step-over-step gait > or = to 50% of the time   Time 4   Period Weeks   Status On-going     PT SHORT TERM GOAL #5   Title improve strength to wean from cane 50% of the time in the home and community.     Time 4   Period Weeks   Status On-going           PT Long Term Goals - 08/30/15 1108      PT LONG TERM GOAL #1   Title be independent in advanced HEP   Time 8   Period Weeks   Status On-going     PT LONG TERM GOAL #2   Title reduce FOTO to < or = to 53% limitation   Period Weeks   Status On-going     PT LONG TERM GOAL #3   Title demonstrate Lt knee AROM flexion to > or = to 100 degrees to improve steps and squatting   Time 8   Period Weeks   Status On-going     PT LONG TERM GOAL #4   Title improve strength to ascend steps with step-over-step > or = to 75% of the time   Time 8   Period Weeks   Status On-going     PT LONG TERM GOAL #5   Title wean from cane > or = to 75% of the time for home and community distances   Time 8   Period Weeks   Status On-going     PT LONG TERM GOAL #6  Title reduce Lt knee pain to < or = to 2/10 with standing tasks   Time 8   Period Weeks   Status On-going               Plan - 08/30/15 1140    Clinical Impression Statement Pt having some  increased Rt hip pian today in standing and with turning. Unable to tolerate standing exrecises well today so mostly seated and supine exercises. Manual soft tissue mobiliztion to Lt LE to decrease swelling and pain.    Rehab Potential Good   PT Frequency 2x / week   PT Duration 8 weeks   PT Treatment/Interventions ADLs/Self Care Home Management;Cryotherapy;Electrical Stimulation;Functional mobility training;Stair training;Gait training;Ultrasound;Therapeutic activities;Therapeutic exercise;Balance training;Neuromuscular re-education;Patient/family education;Manual techniques;Passive range of motion;Vasopneumatic Device;Taping   PT Next Visit Plan Lt knee standing and strengthening exercises, gait training, edema management    Consulted and Agree with Plan of Care Patient      Patient will benefit from skilled therapeutic intervention in order to improve the following deficits and impairments:  Abnormal gait, Pain, Increased edema, Decreased strength, Decreased mobility, Decreased scar mobility, Impaired flexibility, Decreased activity tolerance, Decreased endurance, Decreased range of motion, Difficulty walking  Visit Diagnosis: Pain in left knee  Localized edema  Other abnormalities of gait and mobility  Muscle weakness (generalized)     Problem List Patient Active Problem List   Diagnosis Date Noted  . Osteoarthritis of left knee 08/10/2015  . Status post total left knee replacement 08/10/2015  . Osteoarthritis of left hip 11/02/2014  . Status post total replacement of left hip 11/02/2014  . Osteoarthritis of right hip 09/08/2014  . Status post total replacement of right hip 09/08/2014  . PMB (postmenopausal bleeding) 02/28/2013  . Morbid obesity ([place]) 02/28/2013  . Pure hypercholesterolemia 02/28/2013  . Essential hypertension, benign 02/28/2013    Mikle Bosworth PTA 08/30/2015, 11:51 AM  [facility] [address], [place], [place], [address] Phone: [phone]   Fax:  [phone]  Name: [name] MRN: [id number] Date of Birth: 1950-06-19

## 2015-09-01 ENCOUNTER — Encounter: Payer: Self-pay | Admitting: Physical Therapy

## 2015-09-01 ENCOUNTER — Ambulatory Visit: Payer: Medicare Other | Admitting: Physical Therapy

## 2015-09-01 DIAGNOSIS — M6281 Muscle weakness (generalized): Secondary | ICD-10-CM | POA: Diagnosis not present

## 2015-09-01 DIAGNOSIS — M25562 Pain in left knee: Secondary | ICD-10-CM

## 2015-09-01 DIAGNOSIS — R2689 Other abnormalities of gait and mobility: Secondary | ICD-10-CM

## 2015-09-01 DIAGNOSIS — R600 Localized edema: Secondary | ICD-10-CM | POA: Diagnosis not present

## 2015-09-01 NOTE — Therapy (Signed)
[facility] [address], [place], [place], [address] Phone: [phone]   Fax:  [phone]  Physical Therapy Treatment  Patient Details  Name: [name] MRN: [id number] Date of Birth: May 15, 1950 Referring Provider: Cristal Deer, MD  Encounter Date: 09/01/2015      PT End of Session - 09/01/15 1123    Visit Number 4   Number of Visits 10   Date for PT Re-Evaluation 10/18/15   PT Start Time 1100   PT Stop Time 1158   PT Time Calculation (min) 58 min   Activity Tolerance Patient tolerated treatment well   Behavior During Therapy [facility] for tasks assessed/performed      Past Medical History:  Diagnosis Date  . Abnormal Pap smear of cervix 03/2009   Colpo Biopsy CIN I with HPV  . Arthritis   . Asthma   . Bilateral edema of lower extremity    Takes lasix if needed  . Bronchitis   . Disc disorder    bulging disc in thoracic area  . GERD (gastroesophageal reflux disease)    occ  . Hypercholesteremia   . Hypertension since 1990's  . Lumbar herniated disc   . Obesity   . Pneumonia    hx  . Scoliosis   . Spinal stenosis   . Teeth grinding     Past Surgical History:  Procedure Laterality Date  . APPENDECTOMY  1965  . BACK SURGERY  1993   Thoracic and Lumbar  . BREAST BIOPSY Right 1990   benign cyst  . CERVICAL [place] SURGERY  2007  . COLONOSCOPY W/ POLYPECTOMY    . [place], as child   bilateral inguinal -1 during pregnancy.  Umbilical repair as child  . KNEE ARTHROSCOPY Right 1990  . [place] SURGERY  2006  . SHOULDER ARTHROSCOPY Left 1994   arthritis, hips,fingers  . TONSILLECTOMY AND ADENOIDECTOMY    . TOTAL HIP ARTHROPLASTY Right 09/08/2014   Procedure: RIGHT TOTAL HIP ARTHROPLASTY ANTERIOR APPROACH;  Surgeon: Mcarthur Rossetti, MD;  Location: [place];  Service: Orthopedics;  Laterality: Right;  . TOTAL HIP ARTHROPLASTY Left 11/02/2014   Procedure: LEFT TOTAL HIP  ARTHROPLASTY ANTERIOR APPROACH;  Surgeon: Mcarthur Rossetti, MD;  Location: [place];  Service: Orthopedics;  Laterality: Left;  . TOTAL KNEE ARTHROPLASTY Right 2009  . TOTAL KNEE ARTHROPLASTY Left 08/10/2015  . TOTAL KNEE ARTHROPLASTY Left 08/10/2015   Procedure: LEFT TOTAL KNEE ARTHROPLASTY;  Surgeon: Mcarthur Rossetti, MD;  Location: [place];  Service: Orthopedics;  Laterality: Left;    There were no vitals filed for this visit.      Subjective Assessment - 09/01/15 1103    Subjective Pt reports Rt hip pain is not as pain today after switching the straight cane to Lt side for use with amb.    Patient is accompained by: Family member   Pertinent History Hip replacement surgery: 09/08/14, 11/02/14, Lt TKA 08/10/15   Limitations Standing;Walking   How long can you stand comfortably? 10 minutes max   How long can you walk comfortably? with cane-5 minutes   Patient Stated Goals reduce pain, improve mobility, increase strength   Currently in Pain? Yes   Pain Score 3    Pain Location Knee   Pain Orientation Left   Pain Descriptors / Indicators Aching;Tightness   Pain Type Surgical pain   Pain Onset 1 to 4 weeks ago   Pain Frequency Constant  [place] Adult PT Treatment/Exercise - 09/01/15 0001      Knee/Hip Exercises: Aerobic   Nustep Level 1x 10 minutes  arms only     Knee/Hip Exercises: Supine   Terminal Knee Extension AROM;Strengthening;Left;2 sets;20 reps  #5   Straight Leg Raises AROM;Strengthening;Left;10 reps;2 sets  #2     Modalities   Modalities Vasopneumatic     Vasopneumatic   Number Minutes Vasopneumatic  15 minutes   Vasopnuematic Location  Knee   Vasopneumatic Pressure Medium   Vasopneumatic Temperature  3 snowflakes     Manual Therapy   Manual Therapy Soft tissue mobilization;Passive ROM   Manual therapy comments to Rt knee distal quads and hamstrings Rt hip    Passive ROM Knee flexion and extension                   PT Short Term Goals - 09/01/15 1107      PT SHORT TERM GOAL #1   Title be independent in initial HEP   Time 4   Period Weeks   Status Achieved     PT SHORT TERM GOAL #2   Title report < or = to 4/10 Lt knee pain with standing activity   Time 4   Period Weeks   Status On-going     PT SHORT TERM GOAL #3   Title stand for 15 minutes for home tasks without need to rest   Time 4   Period Weeks   Status On-going     PT SHORT TERM GOAL #4   Title improve strength to ascend steps with step-over-step gait > or = to 50% of the time   Time 4   Period Weeks   Status On-going     PT SHORT TERM GOAL #5   Title improve strength to wean from cane 50% of the time in the home and community.     Time 4   Period Weeks   Status On-going           PT Long Term Goals - 09/01/15 1107      PT LONG TERM GOAL #1   Title be independent in advanced HEP   Time 8   Period Weeks   Status On-going     PT LONG TERM GOAL #2   Title reduce FOTO to < or = to 53% limitation   Time 8   Period Weeks   Status On-going     PT LONG TERM GOAL #3   Title demonstrate Lt knee AROM flexion to > or = to 100 degrees to improve steps and squatting   Time 8   Period Weeks   Status On-going     PT LONG TERM GOAL #4   Title improve strength to ascend steps with step-over-step > or = to 75% of the time   Time 8   Period Weeks   Status On-going     PT LONG TERM GOAL #5   Title wean from cane > or = to 75% of the time for home and community distances   Time 8   Period Weeks   Status On-going     PT LONG TERM GOAL #6   Title reduce Lt knee pain to < or = to 2/10 with standing tasks   Time 8   Period Weeks   Status On-going               Plan - 09/01/15 1124    Clinical Impression Statement Pt continue to  have some Rt hip pain but less than last time. Lt knee stiffness and swelling. Manual work to Lt knee to increase ROM and decrease swelling. Piriformis release  with oragne ball to Rt hip to decreae pain. Pt tolerate all treatment well with decrease in pain. Continue to strengthen Bil LE and decrease pain and swelling.    Rehab Potential Good   PT Frequency 2x / week   PT Duration 8 weeks   PT Treatment/Interventions ADLs/Self Care Home Management;Cryotherapy;Electrical Stimulation;Functional mobility training;Stair training;Gait training;Ultrasound;Therapeutic activities;Therapeutic exercise;Balance training;Neuromuscular re-education;Patient/family education;Manual techniques;Passive range of motion;Vasopneumatic Device;Taping   PT Next Visit Plan Lt knee standing and strengthening exercises, gait training, edema management    Consulted and Agree with Plan of Care Patient      Patient will benefit from skilled therapeutic intervention in order to improve the following deficits and impairments:  Abnormal gait, Pain, Increased edema, Decreased strength, Decreased mobility, Decreased scar mobility, Impaired flexibility, Decreased activity tolerance, Decreased endurance, Decreased range of motion, Difficulty walking  Visit Diagnosis: Pain in left knee  Localized edema  Other abnormalities of gait and mobility  Muscle weakness (generalized)     Problem List Patient Active Problem List   Diagnosis Date Noted  . Osteoarthritis of left knee 08/10/2015  . Status post total left knee replacement 08/10/2015  . Osteoarthritis of left hip 11/02/2014  . Status post total replacement of left hip 11/02/2014  . Osteoarthritis of right hip 09/08/2014  . Status post total replacement of right hip 09/08/2014  . PMB (postmenopausal bleeding) 02/28/2013  . Morbid obesity ([place]) 02/28/2013  . Pure hypercholesterolemia 02/28/2013  . Essential hypertension, benign 02/28/2013    Mikle Bosworth PTA 09/01/2015, 11:44 AM  [facility] [address] [place], [place], [address] Phone: [phone]    Fax:  [phone]  Name: [name] MRN: [id number] Date of Birth: 1950-12-30

## 2015-09-07 ENCOUNTER — Ambulatory Visit: Payer: Medicare Other | Attending: Orthopaedic Surgery | Admitting: Physical Therapy

## 2015-09-07 DIAGNOSIS — R2689 Other abnormalities of gait and mobility: Secondary | ICD-10-CM | POA: Diagnosis not present

## 2015-09-07 DIAGNOSIS — R600 Localized edema: Secondary | ICD-10-CM | POA: Insufficient documentation

## 2015-09-07 DIAGNOSIS — M25551 Pain in right hip: Secondary | ICD-10-CM | POA: Diagnosis not present

## 2015-09-07 DIAGNOSIS — M6281 Muscle weakness (generalized): Secondary | ICD-10-CM | POA: Diagnosis not present

## 2015-09-07 DIAGNOSIS — M25562 Pain in left knee: Secondary | ICD-10-CM | POA: Insufficient documentation

## 2015-09-07 NOTE — Therapy (Signed)
[facility] [address], [place], [place], [address] Phone: [phone]   Fax:  [phone]  Physical Therapy Treatment  Patient Details  Name: [name] MRN: [id number] Date of Birth: 08-04-1950 Referring Provider: Cristal Deer, MD  Encounter Date: 09/07/2015      PT End of Session - 09/07/15 1527    Visit Number 5   Number of Visits 10   Date for PT Re-Evaluation 10/18/15   PT Start Time [id number]   PT Stop Time 1545   PT Time Calculation (min) 60 min   Activity Tolerance Patient tolerated treatment well      Past Medical History:  Diagnosis Date  . Abnormal Pap smear of cervix 03/2009   Colpo Biopsy CIN I with HPV  . Arthritis   . Asthma   . Bilateral edema of lower extremity    Takes lasix if needed  . Bronchitis   . Disc disorder    bulging disc in thoracic area  . GERD (gastroesophageal reflux disease)    occ  . Hypercholesteremia   . Hypertension since 1990's  . Lumbar herniated disc   . Obesity   . Pneumonia    hx  . Scoliosis   . Spinal stenosis   . Teeth grinding     Past Surgical History:  Procedure Laterality Date  . APPENDECTOMY  1965  . BACK SURGERY  1993   Thoracic and Lumbar  . BREAST BIOPSY Right 1990   benign cyst  . CERVICAL [place] SURGERY  2007  . COLONOSCOPY W/ POLYPECTOMY    . [place], as child   bilateral inguinal -1 during pregnancy.  Umbilical repair as child  . KNEE ARTHROSCOPY Right 1990  . [place] SURGERY  2006  . SHOULDER ARTHROSCOPY Left 1994   arthritis, hips,fingers  . TONSILLECTOMY AND ADENOIDECTOMY    . TOTAL HIP ARTHROPLASTY Right 09/08/2014   Procedure: RIGHT TOTAL HIP ARTHROPLASTY ANTERIOR APPROACH;  Surgeon: Mcarthur Rossetti, MD;  Location: [place];  Service: Orthopedics;  Laterality: Right;  . TOTAL HIP ARTHROPLASTY Left 11/02/2014   Procedure: LEFT TOTAL HIP ARTHROPLASTY ANTERIOR APPROACH;  Surgeon: Mcarthur Rossetti,  MD;  Location: [place];  Service: Orthopedics;  Laterality: Left;  . TOTAL KNEE ARTHROPLASTY Right 2009  . TOTAL KNEE ARTHROPLASTY Left 08/10/2015  . TOTAL KNEE ARTHROPLASTY Left 08/10/2015   Procedure: LEFT TOTAL KNEE ARTHROPLASTY;  Surgeon: Mcarthur Rossetti, MD;  Location: [place];  Service: Orthopedics;  Laterality: Left;    There were no vitals filed for this visit.      Subjective Assessment - 09/07/15 1452    Subjective Left knee aches today which is different;  right hip pain with standing up straight produces pain but if bend forward a bit relieves.  History of 3 back surgeries.  Going to see Dr Ninfa Linden on Thursday.     Currently in Pain? Yes   Pain Score 5    Pain Location Knee   Pain Orientation Left                         [facility] Adult PT Treatment/Exercise - 09/07/15 0001      Knee/Hip Exercises: Aerobic   Nustep L1 10 min     Knee/Hip Exercises: Standing   Other Standing Knee Exercises retro stepping 15x   Other Standing Knee Exercises step taps first and second step 10x     Knee/Hip Exercises: Seated  Long Arc [employer] Strengthening;Left;1 set;20 reps   [employer] Weight 2 lbs.   Hamstring Curl AAROM;Strengthening;Left;2 sets;20 reps  red band   Sit to Sand 10 reps;without UE support  discontinued secondary to lateral knee pain     Vasopneumatic   Number Minutes Vasopneumatic  15 minutes   Vasopnuematic Location  Knee   Vasopneumatic Pressure High   Vasopneumatic Temperature  3 snowflakes     Manual Therapy   Joint Mobilization seated knee distraction with flexion overpressure grade 3 15x   Myofascial Release Graston instrument G4 lateral quads, gastroc                  PT Short Term Goals - 09/07/15 2143      PT SHORT TERM GOAL #1   Title be independent in initial HEP   Status Achieved     PT SHORT TERM GOAL #2   Title report < or = to 4/10 Lt knee pain with standing activity   Time 4   Period Weeks   Status On-going      PT SHORT TERM GOAL #3   Title stand for 15 minutes for home tasks without need to rest   Time 4   Period Weeks   Status On-going     PT SHORT TERM GOAL #4   Title improve strength to ascend steps with step-over-step gait > or = to 50% of the time   Time 4   Period Weeks   Status On-going     PT SHORT TERM GOAL #5   Title improve strength to wean from cane 50% of the time in the home and community.     Time 4   Period Weeks   Status On-going           PT Long Term Goals - 09/07/15 2143      PT LONG TERM GOAL #1   Title be independent in advanced HEP   Time 8   Period Weeks   Status On-going     PT LONG TERM GOAL #2   Title reduce FOTO to < or = to 53% limitation   Time 8   Period Weeks   Status On-going     PT LONG TERM GOAL #3   Title demonstrate Lt knee AROM flexion to > or = to 100 degrees to improve steps and squatting   Time 8   Period Weeks   Status On-going     PT LONG TERM GOAL #4   Title improve strength to ascend steps with step-over-step > or = to 75% of the time   Time 8   Period Weeks   Status On-going     PT LONG TERM GOAL #5   Title wean from cane > or = to 75% of the time for home and community distances   Time 8   Period Weeks   Status On-going     PT LONG TERM GOAL #6   Title reduce Lt knee pain to < or = to 2/10 with standing tasks   Time 8   Period Weeks   Status On-going               Plan - 09/07/15 2140    Clinical Impression Statement The patient's primary complaint is lateral left knee pain with weightbearing today.  Moderate knee edema persists but AROM is progressing (near full extension) and improving quad muscle activation.  Decreased right hip pain today.  Therapist closely monitoring response  and modifying for pain.  Continue with treatment plan.     PT Next Visit Plan Lt knee standing and strengthening exercises, gait training, edema management;  manual techniques as needed;  check progress toward STGS       Patient will benefit from skilled therapeutic intervention in order to improve the following deficits and impairments:     Visit Diagnosis: Pain in left knee  Localized edema  Other abnormalities of gait and mobility  Muscle weakness (generalized)     Problem List Patient Active Problem List   Diagnosis Date Noted  . Osteoarthritis of left knee 08/10/2015  . Status post total left knee replacement 08/10/2015  . Osteoarthritis of left hip 11/02/2014  . Status post total replacement of left hip 11/02/2014  . Osteoarthritis of right hip 09/08/2014  . Status post total replacement of right hip 09/08/2014  . PMB (postmenopausal bleeding) 02/28/2013  . Morbid obesity ([place]) 02/28/2013  . Pure hypercholesterolemia 02/28/2013  . Essential hypertension, benign 02/28/2013   Ruben Im, PT 09/07/15 9:46 PM Phone: [phone] Fax: [phone]  Alvera Singh 09/07/2015, 9:45 PM  [facility] [address], [place], [place], [address] Phone: [phone]   Fax:  [phone]  Name: [name] MRN: [id number] Date of Birth: [birth year]

## 2015-09-09 DIAGNOSIS — M25551 Pain in right hip: Secondary | ICD-10-CM | POA: Diagnosis not present

## 2015-09-13 ENCOUNTER — Encounter: Payer: Self-pay | Admitting: Physical Therapy

## 2015-09-13 ENCOUNTER — Ambulatory Visit: Payer: Medicare Other | Admitting: Physical Therapy

## 2015-09-13 DIAGNOSIS — R600 Localized edema: Secondary | ICD-10-CM

## 2015-09-13 DIAGNOSIS — R2689 Other abnormalities of gait and mobility: Secondary | ICD-10-CM | POA: Diagnosis not present

## 2015-09-13 DIAGNOSIS — M6281 Muscle weakness (generalized): Secondary | ICD-10-CM | POA: Diagnosis not present

## 2015-09-13 DIAGNOSIS — M25551 Pain in right hip: Secondary | ICD-10-CM | POA: Diagnosis not present

## 2015-09-13 DIAGNOSIS — M25562 Pain in left knee: Secondary | ICD-10-CM

## 2015-09-13 NOTE — Therapy (Signed)
[facility] [address] [place], [place], [address] Phone: [phone]   Fax:  [phone]  Physical Therapy Treatment  Patient Details  Name: [name] MRN: [id number] Date of Birth: 08-21-1950 Referring Provider: Cristal Deer, MD  Encounter Date: 09/13/2015      PT End of Session - 09/13/15 1200    Visit Number 6   Number of Visits 10   Date for PT Re-Evaluation 10/18/15   PT Start Time [id number]   PT Stop Time 1245   PT Time Calculation (min) 60 min   Activity Tolerance Patient tolerated treatment well   Behavior During Therapy [facility] for tasks assessed/performed      Past Medical History:  Diagnosis Date  . Abnormal Pap smear of cervix 03/2009   Colpo Biopsy CIN I with HPV  . Arthritis   . Asthma   . Bilateral edema of lower extremity    Takes lasix if needed  . Bronchitis   . Disc disorder    bulging disc in thoracic area  . GERD (gastroesophageal reflux disease)    occ  . Hypercholesteremia   . Hypertension since 1990's  . Lumbar herniated disc   . Obesity   . Pneumonia    hx  . Scoliosis   . Spinal stenosis   . Teeth grinding     Past Surgical History:  Procedure Laterality Date  . APPENDECTOMY  1965  . BACK SURGERY  1993   Thoracic and Lumbar  . BREAST BIOPSY Right 1990   benign cyst  . CERVICAL [place] SURGERY  2007  . COLONOSCOPY W/ POLYPECTOMY    . [place], as child   bilateral inguinal -1 during pregnancy.  Umbilical repair as child  . KNEE ARTHROSCOPY Right 1990  . [place] SURGERY  2006  . SHOULDER ARTHROSCOPY Left 1994   arthritis, hips,fingers  . TONSILLECTOMY AND ADENOIDECTOMY    . TOTAL HIP ARTHROPLASTY Right 09/08/2014   Procedure: RIGHT TOTAL HIP ARTHROPLASTY ANTERIOR APPROACH;  Surgeon: Mcarthur Rossetti, MD;  Location: [place];  Service: Orthopedics;  Laterality: Right;  . TOTAL HIP ARTHROPLASTY Left 11/02/2014   Procedure: LEFT TOTAL HIP  ARTHROPLASTY ANTERIOR APPROACH;  Surgeon: Mcarthur Rossetti, MD;  Location: [place];  Service: Orthopedics;  Laterality: Left;  . TOTAL KNEE ARTHROPLASTY Right 2009  . TOTAL KNEE ARTHROPLASTY Left 08/10/2015  . TOTAL KNEE ARTHROPLASTY Left 08/10/2015   Procedure: LEFT TOTAL KNEE ARTHROPLASTY;  Surgeon: Mcarthur Rossetti, MD;  Location: [place];  Service: Orthopedics;  Laterality: Left;    There were no vitals filed for this visit.      Subjective Assessment - 09/13/15 1150    Subjective Pt reports MD has diagnose pt with Rt hip bursitis Md gave pt stretch for hip and has been doing at home but continues to have some pain.    Patient is accompained by: Family member   Pertinent History Hip replacement surgery: 09/08/14, 11/02/14, Lt TKA 08/10/15   Limitations Standing;Walking   How long can you stand comfortably? 10 minutes max   How long can you walk comfortably? with cane-5 minutes   Patient Stated Goals reduce pain, improve mobility, increase strength   Currently in Pain? Yes   Pain Score 4    Pain Location Knee   Pain Orientation Left   Pain Descriptors / Indicators Aching;Tightness   Pain Type Surgical pain   Pain Onset 1 to 4 weeks ago   Pain Frequency  Constant   Aggravating Factors  Standing walking bending   Pain Relieving Factors Ice   Multiple Pain Sites No                         [facility] Adult PT Treatment/Exercise - 09/13/15 0001      Knee/Hip Exercises: Aerobic   Nustep L1 10 min     Knee/Hip Exercises: Standing   Other Standing Knee Exercises retro stepping 15x   Other Standing Knee Exercises step taps first and second step 10x     Knee/Hip Exercises: Seated   Long Arc Quad Strengthening;Left;1 set;20 reps   Sit to [employer] 10 reps;without UE support  discontinued secondary to lateral knee pain     Knee/Hip Exercises: Supine   Terminal Knee Extension AROM;Strengthening;Left;2 sets;20 reps  #5   Straight Leg Raises AROM;Strengthening;Left;10  reps;2 sets  #2     Modalities   Modalities Vasopneumatic     Vasopneumatic   Number Minutes Vasopneumatic  15 minutes   Vasopnuematic Location  Knee   Vasopneumatic Pressure High   Vasopneumatic Temperature  3 snowflakes     Manual Therapy   Manual Therapy Soft tissue mobilization   Manual therapy comments Scar tissue mobilization                  PT Short Term Goals - 09/13/15 1155      PT SHORT TERM GOAL #1   Title be independent in initial HEP   Time 4   Period Weeks   Status Achieved     PT SHORT TERM GOAL #2   Title report < or = to 4/10 Lt knee pain with standing activity   Time 4   Period Weeks   Status On-going     PT SHORT TERM GOAL #3   Title stand for 15 minutes for home tasks without need to rest   Time 4   Period Weeks   Status On-going     PT SHORT TERM GOAL #4   Title improve strength to ascend steps with step-over-step gait > or = to 50% of the time   Time 4   Period Weeks   Status On-going     PT SHORT TERM GOAL #5   Title improve strength to wean from cane 50% of the time in the home and community.     Time 4   Period Weeks   Status On-going           PT Long Term Goals - 09/07/15 2143      PT LONG TERM GOAL #1   Title be independent in advanced HEP   Time 8   Period Weeks   Status On-going     PT LONG TERM GOAL #2   Title reduce FOTO to < or = to 53% limitation   Time 8   Period Weeks   Status On-going     PT LONG TERM GOAL #3   Title demonstrate Lt knee AROM flexion to > or = to 100 degrees to improve steps and squatting   Time 8   Period Weeks   Status On-going     PT LONG TERM GOAL #4   Title improve strength to ascend steps with step-over-step > or = to 75% of the time   Time 8   Period Weeks   Status On-going     PT LONG TERM GOAL #5   Title wean from cane > or = to  75% of the time for home and community distances   Time 8   Period Weeks   Status On-going     PT LONG TERM GOAL #6   Title reduce  Lt knee pain to < or = to 2/10 with standing tasks   Time 8   Period Weeks   Status On-going               Plan - 09/13/15 1219    Clinical Impression Statement Pt continues to have lateral Lt knee pain. Pt also having Rt LE problems such as Rt hip bursitis and cramps. Pt able to tolerate all exercises well. Will continue to increase Lt knee strength and stability. Continue to progress with weight bearing and balance exercies.    Rehab Potential Good   PT Frequency 2x / week   PT Duration 8 weeks   PT Treatment/Interventions ADLs/Self Care Home Management;Cryotherapy;Electrical Stimulation;Functional mobility training;Stair training;Gait training;Ultrasound;Therapeutic activities;Therapeutic exercise;Balance training;Neuromuscular re-education;Patient/family education;Manual techniques;Passive range of motion;Vasopneumatic Device;Taping   PT Next Visit Plan Lt knee standing and strengthening exercises, gait training, edema management;  manual techniques as needed;  check progress toward STGS   Consulted and Agree with Plan of Care Patient      Patient will benefit from skilled therapeutic intervention in order to improve the following deficits and impairments:  Abnormal gait, Pain, Increased edema, Decreased strength, Decreased mobility, Decreased scar mobility, Impaired flexibility, Decreased activity tolerance, Decreased endurance, Decreased range of motion, Difficulty walking  Visit Diagnosis: Pain in left knee  Localized edema  Other abnormalities of gait and mobility  Muscle weakness (generalized)     Problem List Patient Active Problem List   Diagnosis Date Noted  . Osteoarthritis of left knee 08/10/2015  . Status post total left knee replacement 08/10/2015  . Osteoarthritis of left hip 11/02/2014  . Status post total replacement of left hip 11/02/2014  . Osteoarthritis of right hip 09/08/2014  . Status post total replacement of right hip 09/08/2014  . PMB  (postmenopausal bleeding) 02/28/2013  . Morbid obesity ([place]) 02/28/2013  . Pure hypercholesterolemia 02/28/2013  . Essential hypertension, benign 02/28/2013    Mikle Bosworth PTA 09/13/2015, 2:02 PM  [facility] [address], [place], [place], [address] Phone: [phone]   Fax:  [phone]  Name: MALKA LUPTON MRN: [id number] Date of Birth: [birth year]

## 2015-09-15 ENCOUNTER — Ambulatory Visit: Payer: Medicare Other

## 2015-09-15 DIAGNOSIS — M6281 Muscle weakness (generalized): Secondary | ICD-10-CM

## 2015-09-15 DIAGNOSIS — R2689 Other abnormalities of gait and mobility: Secondary | ICD-10-CM | POA: Diagnosis not present

## 2015-09-15 DIAGNOSIS — R600 Localized edema: Secondary | ICD-10-CM | POA: Diagnosis not present

## 2015-09-15 DIAGNOSIS — M25562 Pain in left knee: Secondary | ICD-10-CM

## 2015-09-15 DIAGNOSIS — M25551 Pain in right hip: Secondary | ICD-10-CM | POA: Diagnosis not present

## 2015-09-15 NOTE — Therapy (Signed)
[facility] [address], [place], [place], [address] Phone: [phone]   Fax:  [phone]  Physical Therapy Treatment  Patient Details  Name: [name] MRN: [id number] Date of Birth: Dec 02, 1950 Referring Provider: Cristal Deer, MD  Encounter Date: 09/15/2015      PT End of Session - 09/15/15 1227    Visit Number 7   Number of Visits 10   Date for PT Re-Evaluation 10/18/15   PT Start Time [id number]   PT Stop Time 1244   PT Time Calculation (min) 59 min   Activity Tolerance Patient tolerated treatment well   Behavior During Therapy [facility] for tasks assessed/performed      Past Medical History:  Diagnosis Date  . Abnormal Pap smear of cervix 03/2009   Colpo Biopsy CIN I with HPV  . Arthritis   . Asthma   . Bilateral edema of lower extremity    Takes lasix if needed  . Bronchitis   . Disc disorder    bulging disc in thoracic area  . GERD (gastroesophageal reflux disease)    occ  . Hypercholesteremia   . Hypertension since 1990's  . Lumbar herniated disc   . Obesity   . Pneumonia    hx  . Scoliosis   . Spinal stenosis   . Teeth grinding     Past Surgical History:  Procedure Laterality Date  . APPENDECTOMY  1965  . BACK SURGERY  1993   Thoracic and Lumbar  . BREAST BIOPSY Right 1990   benign cyst  . CERVICAL [place] SURGERY  2007  . COLONOSCOPY W/ POLYPECTOMY    . [place], as child   bilateral inguinal -1 during pregnancy.  Umbilical repair as child  . KNEE ARTHROSCOPY Right 1990  . [place] SURGERY  2006  . SHOULDER ARTHROSCOPY Left 1994   arthritis, hips,fingers  . TONSILLECTOMY AND ADENOIDECTOMY    . TOTAL HIP ARTHROPLASTY Right 09/08/2014   Procedure: RIGHT TOTAL HIP ARTHROPLASTY ANTERIOR APPROACH;  Surgeon: Mcarthur Rossetti, MD;  Location: [place];  Service: Orthopedics;  Laterality: Right;  . TOTAL HIP ARTHROPLASTY Left 11/02/2014   Procedure: LEFT TOTAL HIP  ARTHROPLASTY ANTERIOR APPROACH;  Surgeon: Mcarthur Rossetti, MD;  Location: [place];  Service: Orthopedics;  Laterality: Left;  . TOTAL KNEE ARTHROPLASTY Right 2009  . TOTAL KNEE ARTHROPLASTY Left 08/10/2015  . TOTAL KNEE ARTHROPLASTY Left 08/10/2015   Procedure: LEFT TOTAL KNEE ARTHROPLASTY;  Surgeon: Mcarthur Rossetti, MD;  Location: [place];  Service: Orthopedics;  Laterality: Left;    There were no vitals filed for this visit.      Subjective Assessment - 09/15/15 1149    Subjective Rt hip is still bothering me.     Currently in Pain? Yes   Pain Score 5    Pain Location Knee   Pain Orientation Left   Pain Descriptors / Indicators Aching;Tightness   Pain Type Surgical pain   Pain Onset 1 to 4 weeks ago   Pain Frequency Constant            [facility] PT Assessment - 09/15/15 0001      Assessment   Medical Diagnosis s/p Lt TKA     AROM   Overall AROM  Deficits   AROM Assessment Site Knee   Right/Left Knee Left   Left Knee Extension 2   Left Knee Flexion 105  [place] Adult PT Treatment/Exercise - 09/15/15 0001      Knee/Hip Exercises: Aerobic   Nustep L1 10 min     Knee/Hip Exercises: Standing   Rocker Board 3 minutes   Other Standing Knee Exercises retro stepping 15x   Other Standing Knee Exercises step taps first and second step 10x     Knee/Hip Exercises: Seated   Long Arc Quad Strengthening;Left;1 set;20 reps     Knee/Hip Exercises: Supine   Straight Leg Raises AROM;Strengthening;Left;10 reps;2 sets  #2     Modalities   Modalities Vasopneumatic     Vasopneumatic   Number Minutes Vasopneumatic  15 minutes   Vasopnuematic Location  Knee   Vasopneumatic Pressure High   Vasopneumatic Temperature  3 snowflakes     Manual Therapy   Manual Therapy Soft tissue mobilization   Manual therapy comments Scar tissue mobilization                  PT Short Term Goals - 09/15/15 1207      PT SHORT TERM GOAL #2    Title report < or = to 4/10 Lt knee pain with standing activity   Time 4   Period Weeks   Status On-going     PT SHORT TERM GOAL #5   Title improve strength to wean from cane 50% of the time in the home and community.     Time 4   Period Weeks   Status On-going           PT Long Term Goals - 09/15/15 1208      PT LONG TERM GOAL #3   Title demonstrate Lt knee AROM flexion to > or = to 100 degrees to improve steps and squatting   Status Achieved               Plan - 09/15/15 1204    Clinical Impression Statement Pt with improved Lt knee AROM.  Pt with 2-105 degrees Lt knee AROM today.  Rt hip pain is limiting her standing tolerance.  Pt able to tolerate exercise in the clinic today with few modifications needed.  Pt with continued Lt knee pain, edema, and stiffness/weakness s/p TKA.  Pt will continue to benefit from skilled PT for strength, mobility and edema/pain management.     Rehab Potential Good   PT Frequency 2x / week   PT Duration 8 weeks   PT Treatment/Interventions ADLs/Self Care Home Management;Cryotherapy;Electrical Stimulation;Functional mobility training;Stair training;Gait training;Ultrasound;Therapeutic activities;Therapeutic exercise;Balance training;Neuromuscular re-education;Patient/family education;Manual techniques;Passive range of motion;Vasopneumatic Device;Taping   PT Next Visit Plan Lt knee standing and strengthening exercises, gait training, edema management;  manual techniques as needed      Patient will benefit from skilled therapeutic intervention in order to improve the following deficits and impairments:  Abnormal gait, Pain, Increased edema, Decreased strength, Decreased mobility, Decreased scar mobility, Impaired flexibility, Decreased activity tolerance, Decreased endurance, Decreased range of motion, Difficulty walking  Visit Diagnosis: Pain in left knee  Localized edema  Other abnormalities of gait and mobility  Muscle weakness  (generalized)     Problem List Patient Active Problem List   Diagnosis Date Noted  . Osteoarthritis of left knee 08/10/2015  . Status post total left knee replacement 08/10/2015  . Osteoarthritis of left hip 11/02/2014  . Status post total replacement of left hip 11/02/2014  . Osteoarthritis of right hip 09/08/2014  . Status post total replacement of right hip 09/08/2014  . PMB (postmenopausal bleeding) 02/28/2013  . Morbid obesity (  [place]) 02/28/2013  . Pure hypercholesterolemia 02/28/2013  . Essential hypertension, benign 02/28/2013     Sigurd Sos, PT 09/15/15 12:29 PM  [facility] [address], [place], [place], [address] Phone: [phone]   Fax:  [phone]  Name: KENDRIANNA ARBANAS MRN: [id number] Date of Birth: May 07, 1950

## 2015-09-20 ENCOUNTER — Encounter: Payer: Self-pay | Admitting: Physical Therapy

## 2015-09-20 ENCOUNTER — Ambulatory Visit: Payer: Medicare Other | Admitting: Physical Therapy

## 2015-09-20 DIAGNOSIS — R2689 Other abnormalities of gait and mobility: Secondary | ICD-10-CM

## 2015-09-20 DIAGNOSIS — M25562 Pain in left knee: Secondary | ICD-10-CM

## 2015-09-20 DIAGNOSIS — M6281 Muscle weakness (generalized): Secondary | ICD-10-CM

## 2015-09-20 DIAGNOSIS — R600 Localized edema: Secondary | ICD-10-CM | POA: Diagnosis not present

## 2015-09-20 DIAGNOSIS — M25551 Pain in right hip: Secondary | ICD-10-CM | POA: Diagnosis not present

## 2015-09-20 NOTE — Therapy (Addendum)
[facility] [address] [place], [place], [address] Phone: [phone]   Fax:  [phone]  Physical Therapy Treatment  Patient Details  Name: [name] MRN: [id number] Date of Birth: [birth year] Referring Provider: Cristal Deer, MD  Encounter Date: 09/20/2015      PT End of Session - 09/20/15 1425    Visit Number 8   Number of Visits 10   Date for PT Re-Evaluation 10/18/15   PT Start Time 1146   PT Stop Time 1248   PT Time Calculation (min) 62 min   Activity Tolerance Patient tolerated treatment well   Behavior During Therapy [facility] for tasks assessed/performed      Past Medical History:  Diagnosis Date  . Abnormal Pap smear of cervix 03/2009   Colpo Biopsy CIN I with HPV  . Arthritis   . Asthma   . Bilateral edema of lower extremity    Takes lasix if needed  . Bronchitis   . Disc disorder    bulging disc in thoracic area  . GERD (gastroesophageal reflux disease)    occ  . Hypercholesteremia   . Hypertension since 1990's  . Lumbar herniated disc   . Obesity   . Pneumonia    hx  . Scoliosis   . Spinal stenosis   . Teeth grinding     Past Surgical History:  Procedure Laterality Date  . APPENDECTOMY  1965  . BACK SURGERY  1993   Thoracic and Lumbar  . BREAST BIOPSY Right 1990   benign cyst  . CERVICAL [place] SURGERY  2007  . COLONOSCOPY W/ POLYPECTOMY    . [place], as child   bilateral inguinal -1 during pregnancy.  Umbilical repair as child  . KNEE ARTHROSCOPY Right 1990  . [place] SURGERY  2006  . SHOULDER ARTHROSCOPY Left 1994   arthritis, hips,fingers  . TONSILLECTOMY AND ADENOIDECTOMY    . TOTAL HIP ARTHROPLASTY Right 09/08/2014   Procedure: RIGHT TOTAL HIP ARTHROPLASTY ANTERIOR APPROACH;  Surgeon: Mcarthur Rossetti, MD;  Location: [place];  Service: Orthopedics;  Laterality: Right;  . TOTAL HIP ARTHROPLASTY Left 11/02/2014   Procedure: LEFT TOTAL HIP  ARTHROPLASTY ANTERIOR APPROACH;  Surgeon: Mcarthur Rossetti, MD;  Location: [place];  Service: Orthopedics;  Laterality: Left;  . TOTAL KNEE ARTHROPLASTY Right 2009  . TOTAL KNEE ARTHROPLASTY Left 08/10/2015  . TOTAL KNEE ARTHROPLASTY Left 08/10/2015   Procedure: LEFT TOTAL KNEE ARTHROPLASTY;  Surgeon: Mcarthur Rossetti, MD;  Location: [place];  Service: Orthopedics;  Laterality: Left;    There were no vitals filed for this visit.      Subjective Assessment - 09/20/15 1150    Subjective Pt had Cortizone shot in Rt hip which pt doesn't feel helped a lot. Having trouble sleeping due to sharp pains in Bil LE.    Patient is accompained by: Family member   Pertinent History Hip replacement surgery: 09/08/14, 11/02/14, Lt TKA 08/10/15   Limitations Standing;Walking   Currently in Pain? Yes   Pain Score 4    Pain Location Knee   Pain Orientation Left   Pain Descriptors / Indicators Aching;Tightness   Pain Type Surgical pain                         [facility] Adult PT Treatment/Exercise - 09/20/15 0001      Knee/Hip Exercises: Aerobic   Recumbent Bike 10 minutes  while therapist assesing pain  Knee/Hip Exercises: Standing   Hip Abduction Stengthening;Both;2 sets;10 reps  Verbal cues for posture and position   Hip Extension Stengthening;Both;2 sets;10 reps  verbal cues for posture and position   Lateral Step Up Both;1 set;10 reps;Step Height: 4"  stand by assist for safety   Rebounder weight shifting  1 minute each direction     Knee/Hip Exercises: Supine   Other Supine Knee/Hip Exercises hip abduction  red tband     Knee/Hip Exercises: Sidelying   Hip ABduction --  attempted, pt unable to lay on sides     Vasopneumatic: 15 minutes, Lt knee, medium compression, 3 snow flakes Addended 09/20/15 Mikle Bosworth, PTA 09/22/15 4:56 PM             PT Short Term Goals - 09/20/15 1229      PT SHORT TERM GOAL #1   Title be independent in initial HEP    Time 4   Period Weeks   Status Achieved     PT SHORT TERM GOAL #2   Title report < or = to 4/10 Lt knee pain with standing activity   Time 4   Period Weeks   Status On-going     PT SHORT TERM GOAL #3   Title stand for 15 minutes for home tasks without need to rest   Time 4   Period Weeks   Status On-going     PT SHORT TERM GOAL #4   Title improve strength to ascend steps with step-over-step gait > or = to 50% of the time   Time 4   Period Weeks   Status On-going     PT SHORT TERM GOAL #5   Title improve strength to wean from cane 50% of the time in the home and community.     Time 4   Period Weeks   Status On-going           PT Long Term Goals - 09/20/15 1229      PT LONG TERM GOAL #1   Title be independent in advanced HEP   Time 8   Period Weeks   Status On-going     PT LONG TERM GOAL #2   Title reduce FOTO to < or = to 53% limitation   Time 8   Period Weeks   Status On-going     PT LONG TERM GOAL #3   Title demonstrate Lt knee AROM flexion to > or = to 100 degrees to improve steps and squatting   Time 8   Period Weeks   Status Achieved     PT LONG TERM GOAL #4   Title improve strength to ascend steps with step-over-step > or = to 75% of the time   Time 8   Period Weeks     PT LONG TERM GOAL #5   Title wean from cane > or = to 75% of the time for home and community distances   Time 8   Period Weeks   Status On-going     PT LONG TERM GOAL #6   Title reduce Lt knee pain to < or = to 2/10 with standing tasks   Time 8   Period Weeks   Status On-going               Plan - 09/20/15 1238    Clinical Impression Statement Pt continues to have Rt hip pains which affect tolerance for weight bearing exercies but pt is able to complete with seated rest breaks.  Pt needs verbal cues to engage core and stabilize pelvis. Will continue to increase Bil LE strength and stability and progress with standing exercises as tolerated.    Rehab Potential Good    PT Frequency 2x / week   PT Duration 8 weeks   PT Treatment/Interventions ADLs/Self Care Home Management;Cryotherapy;Electrical Stimulation;Functional mobility training;Stair training;Gait training;Ultrasound;Therapeutic activities;Therapeutic exercise;Balance training;Neuromuscular re-education;Patient/family education;Manual techniques;Passive range of motion;Vasopneumatic Device;Taping   PT Next Visit Plan Standing exercises in front of mirror, gait training, edema management    Consulted and Agree with Plan of Care Patient      Patient will benefit from skilled therapeutic intervention in order to improve the following deficits and impairments:  Abnormal gait, Pain, Increased edema, Decreased strength, Decreased mobility, Decreased scar mobility, Impaired flexibility, Decreased activity tolerance, Decreased endurance, Decreased range of motion, Difficulty walking  Visit Diagnosis: Pain in left knee  Localized edema  Other abnormalities of gait and mobility  Muscle weakness (generalized)     Problem List Patient Active Problem List   Diagnosis Date Noted  . Osteoarthritis of left knee 08/10/2015  . Status post total left knee replacement 08/10/2015  . Osteoarthritis of left hip 11/02/2014  . Status post total replacement of left hip 11/02/2014  . Osteoarthritis of right hip 09/08/2014  . Status post total replacement of right hip 09/08/2014  . PMB (postmenopausal bleeding) 02/28/2013  . Morbid obesity ([place]) 02/28/2013  . Pure hypercholesterolemia 02/28/2013  . Essential hypertension, benign 02/28/2013    Mikle Bosworth PTA 09/20/2015, 3:59 PM  [facility] [address] [place], [place], [address] Phone: [phone]   Fax:  [phone]  Name: [name] MRN: [id number] Date of Birth: November 15, 1950

## 2015-09-22 ENCOUNTER — Ambulatory Visit: Payer: Medicare Other

## 2015-09-22 DIAGNOSIS — R2689 Other abnormalities of gait and mobility: Secondary | ICD-10-CM

## 2015-09-22 DIAGNOSIS — M25551 Pain in right hip: Secondary | ICD-10-CM | POA: Diagnosis not present

## 2015-09-22 DIAGNOSIS — M25562 Pain in left knee: Secondary | ICD-10-CM | POA: Diagnosis not present

## 2015-09-22 DIAGNOSIS — M6281 Muscle weakness (generalized): Secondary | ICD-10-CM

## 2015-09-22 DIAGNOSIS — R600 Localized edema: Secondary | ICD-10-CM

## 2015-09-22 NOTE — Therapy (Signed)
[facility] [address], [place], [place], [address] Phone: [phone]   Fax:  [phone]  Physical Therapy Treatment  Patient Details  Name: [name] MRN: [id number] Date of Birth: [birth year] Referring Provider: Cristal Deer, MD  Encounter Date: 09/22/2015      PT End of Session - 09/22/15 1227    Visit Number 9   Number of Visits 19   Date for PT Re-Evaluation 10/18/15   PT Start Time 1146   PT Stop Time 1243   PT Time Calculation (min) 57 min   Activity Tolerance Patient tolerated treatment well   Behavior During Therapy [facility] for tasks assessed/performed      Past Medical History:  Diagnosis Date  . Abnormal Pap smear of cervix 03/2009   Colpo Biopsy CIN I with HPV  . Arthritis   . Asthma   . Bilateral edema of lower extremity    Takes lasix if needed  . Bronchitis   . Disc disorder    bulging disc in thoracic area  . GERD (gastroesophageal reflux disease)    occ  . Hypercholesteremia   . Hypertension since 1990's  . Lumbar herniated disc   . Obesity   . Pneumonia    hx  . Scoliosis   . Spinal stenosis   . Teeth grinding     Past Surgical History:  Procedure Laterality Date  . APPENDECTOMY  1965  . BACK SURGERY  1993   Thoracic and Lumbar  . BREAST BIOPSY Right 1990   benign cyst  . CERVICAL [place] SURGERY  2007  . COLONOSCOPY W/ POLYPECTOMY    . [place], as child   bilateral inguinal -1 during pregnancy.  Umbilical repair as child  . KNEE ARTHROSCOPY Right 1990  . [place] SURGERY  2006  . SHOULDER ARTHROSCOPY Left 1994   arthritis, hips,fingers  . TONSILLECTOMY AND ADENOIDECTOMY    . TOTAL HIP ARTHROPLASTY Right 09/08/2014   Procedure: RIGHT TOTAL HIP ARTHROPLASTY ANTERIOR APPROACH;  Surgeon: Mcarthur Rossetti, MD;  Location: [place];  Service: Orthopedics;  Laterality: Right;  . TOTAL HIP ARTHROPLASTY Left 11/02/2014   Procedure: LEFT TOTAL HIP  ARTHROPLASTY ANTERIOR APPROACH;  Surgeon: Mcarthur Rossetti, MD;  Location: [place];  Service: Orthopedics;  Laterality: Left;  . TOTAL KNEE ARTHROPLASTY Right 2009  . TOTAL KNEE ARTHROPLASTY Left 08/10/2015  . TOTAL KNEE ARTHROPLASTY Left 08/10/2015   Procedure: LEFT TOTAL KNEE ARTHROPLASTY;  Surgeon: Mcarthur Rossetti, MD;  Location: [place];  Service: Orthopedics;  Laterality: Left;    There were no vitals filed for this visit.      Subjective Assessment - 09/22/15 1155    Subjective My Lt knee is feeling so much better.  Rt hip is still bothering me.     Pertinent History Hip replacement surgery: 09/08/14, 11/02/14, Lt TKA 08/10/15   Currently in Pain? Yes   Pain Score 3    Pain Location Knee   Pain Orientation Left   Pain Descriptors / Indicators Aching;Tightness   Pain Type Surgical pain            [facility] PT Assessment - 09/22/15 0001      Assessment   Medical Diagnosis s/p Lt TKA     Prior Function   Level of Independence Independent     Observation/Other Assessments   Focus on Therapeutic Outcomes (FOTO)  52% limitation     AROM   Overall AROM  Deficits  AROM Assessment Site Knee   Right/Left Knee Left   Left Knee Extension 2   Left Knee Flexion 105                     [facility] Adult PT Treatment/Exercise - 09/22/15 0001      Knee/Hip Exercises: Aerobic   Nustep L2 10 min  PT present to discuss progress with pt.     Knee/Hip Exercises: Standing   Hip Abduction Stengthening;Both;2 sets;10 reps  Verbal cues for posture and position   Hip Extension Stengthening;Both;2 sets;10 reps  verbal cues for posture and position   Rocker Board 3 minutes   Rebounder weight shifting  1 minute each direction     Knee/Hip Exercises: Seated   Long Arc Quad Strengthening;Left;1 set;20 reps   Long Arc Quad Weight --  2.5     Vasopneumatic   Number Minutes Vasopneumatic  15 minutes   Vasopnuematic Location  Knee   Vasopneumatic Pressure High    Vasopneumatic Temperature  3 snowflakes                  PT Short Term Goals - 09/20/15 1229      PT SHORT TERM GOAL #1   Title be independent in initial HEP   Time 4   Period Weeks   Status Achieved     PT SHORT TERM GOAL #2   Title report < or = to 4/10 Lt knee pain with standing activity   Time 4   Period Weeks   Status On-going     PT SHORT TERM GOAL #3   Title stand for 15 minutes for home tasks without need to rest   Time 4   Period Weeks   Status On-going     PT SHORT TERM GOAL #4   Title improve strength to ascend steps with step-over-step gait > or = to 50% of the time   Time 4   Period Weeks   Status On-going     PT SHORT TERM GOAL #5   Title improve strength to wean from cane 50% of the time in the home and community.     Time 4   Period Weeks   Status On-going           PT Long Term Goals - 09/22/15 1205      PT LONG TERM GOAL #1   Title be independent in advanced HEP   Time 8   Period Weeks   Status On-going     PT LONG TERM GOAL #2   Title reduce FOTO to < or = to 53% limitation   Status Achieved     PT LONG TERM GOAL #3   Title demonstrate Lt knee AROM flexion to > or = to 100 degrees to improve steps and squatting   Status Achieved     PT LONG TERM GOAL #4   Title improve strength to ascend steps with step-over-step > or = to 75% of the time   Time 8   Period Weeks   Status On-going     PT LONG TERM GOAL #5   Title wean from cane > or = to 75% of the time for home and community distances   Status Achieved     PT LONG TERM GOAL #6   Title reduce Lt knee pain to < or = to 2/10 with standing tasks   Time 8   Period Weeks   Status On-going  Plan - 2015/10/07 1205    Clinical Impression Statement Pt reports that Lt knee is feeling much better.  Limitation in standing and walking is mainly due to Rt hip pain.  Pt able to tolerate exercise in clinic without limitation today.  FOTO is improved to 52%  limitation. Pt with continued Lt knee AROM and strength deficits with edema s/p surgery and will continue to benefit from skilled PT for strength, endurance and edema management.     Rehab Potential Good   PT Frequency 2x / week   PT Duration 8 weeks   PT Treatment/Interventions ADLs/Self Care Home Management;Cryotherapy;Electrical Stimulation;Functional mobility training;Stair training;Gait training;Ultrasound;Therapeutic activities;Therapeutic exercise;Balance training;Neuromuscular re-education;Patient/family education;Manual techniques;Passive range of motion;Vasopneumatic Device;Taping   PT Next Visit Plan Standing strength, edema management, Lt knee flexibilty   Consulted and Agree with Plan of Care Patient      Patient will benefit from skilled therapeutic intervention in order to improve the following deficits and impairments:  Abnormal gait, Pain, Increased edema, Decreased strength, Decreased mobility, Decreased scar mobility, Impaired flexibility, Decreased activity tolerance, Decreased endurance, Decreased range of motion, Difficulty walking  Visit Diagnosis: Pain in left knee  Localized edema  Other abnormalities of gait and mobility  Muscle weakness (generalized)       G-Codes - 10/07/2015 1205    Functional Assessment Tool Used FOTO: 52% limitation   Functional Limitation Mobility: Walking and moving around   Mobility: Walking and Moving Around Current Status [phone]) At least 40 percent but less than 60 percent impaired, limited or restricted   Mobility: Walking and Moving Around Goal Status [phone]) At least 40 percent but less than 60 percent impaired, limited or restricted      Problem List Patient Active Problem List   Diagnosis Date Noted  . Osteoarthritis of left knee 08/10/2015  . Status post total left knee replacement 08/10/2015  . Osteoarthritis of left hip 11/02/2014  . Status post total replacement of left hip 11/02/2014  . Osteoarthritis of right hip  09/08/2014  . Status post total replacement of right hip 09/08/2014  . PMB (postmenopausal bleeding) 02/28/2013  . Morbid obesity ([place]) 02/28/2013  . Pure hypercholesterolemia 02/28/2013  . Essential hypertension, benign 02/28/2013    Sigurd Sos, PT 2015-10-07 12:29 PM  [facility] [address] [place], [place], [address] Phone: [phone]   Fax:  [phone]  Name: KEYLI SOMER MRN: [id number] Date of Birth: [birth year]

## 2015-09-27 ENCOUNTER — Ambulatory Visit: Payer: Medicare Other

## 2015-09-27 DIAGNOSIS — R2689 Other abnormalities of gait and mobility: Secondary | ICD-10-CM

## 2015-09-27 DIAGNOSIS — M25562 Pain in left knee: Secondary | ICD-10-CM

## 2015-09-27 DIAGNOSIS — M6281 Muscle weakness (generalized): Secondary | ICD-10-CM | POA: Diagnosis not present

## 2015-09-27 DIAGNOSIS — R600 Localized edema: Secondary | ICD-10-CM | POA: Diagnosis not present

## 2015-09-27 DIAGNOSIS — M25551 Pain in right hip: Secondary | ICD-10-CM | POA: Diagnosis not present

## 2015-09-27 NOTE — Therapy (Signed)
[facility] [address] [place], [place], [address] Phone: [phone]   Fax:  [phone]  Physical Therapy Treatment  Patient Details  Name: [name] MRN: [id number] Date of Birth: 1950-06-01 Referring Provider: Cristal Deer, MD  Encounter Date: 09/27/2015      PT End of Session - 09/27/15 1226    Visit Number 10   Number of Visits 19   Date for PT Re-Evaluation 10/18/15   PT Start Time [id number]   PT Stop Time 1240   PT Time Calculation (min) 58 min   Activity Tolerance Patient tolerated treatment well   Behavior During Therapy [facility] for tasks assessed/performed      Past Medical History:  Diagnosis Date  . Abnormal Pap smear of cervix 03/2009   Colpo Biopsy CIN I with HPV  . Arthritis   . Asthma   . Bilateral edema of lower extremity    Takes lasix if needed  . Bronchitis   . Disc disorder    bulging disc in thoracic area  . GERD (gastroesophageal reflux disease)    occ  . Hypercholesteremia   . Hypertension since 1990's  . Lumbar herniated disc   . Obesity   . Pneumonia    hx  . Scoliosis   . Spinal stenosis   . Teeth grinding     Past Surgical History:  Procedure Laterality Date  . APPENDECTOMY  1965  . BACK SURGERY  1993   Thoracic and Lumbar  . BREAST BIOPSY Right 1990   benign cyst  . CERVICAL [place] SURGERY  2007  . COLONOSCOPY W/ POLYPECTOMY    . [place], as child   bilateral inguinal -1 during pregnancy.  Umbilical repair as child  . KNEE ARTHROSCOPY Right 1990  . [place] SURGERY  2006  . SHOULDER ARTHROSCOPY Left 1994   arthritis, hips,fingers  . TONSILLECTOMY AND ADENOIDECTOMY    . TOTAL HIP ARTHROPLASTY Right 09/08/2014   Procedure: RIGHT TOTAL HIP ARTHROPLASTY ANTERIOR APPROACH;  Surgeon: Mcarthur Rossetti, MD;  Location: [place];  Service: Orthopedics;  Laterality: Right;  . TOTAL HIP ARTHROPLASTY Left 11/02/2014   Procedure: LEFT TOTAL HIP  ARTHROPLASTY ANTERIOR APPROACH;  Surgeon: Mcarthur Rossetti, MD;  Location: [place];  Service: Orthopedics;  Laterality: Left;  . TOTAL KNEE ARTHROPLASTY Right 2009  . TOTAL KNEE ARTHROPLASTY Left 08/10/2015  . TOTAL KNEE ARTHROPLASTY Left 08/10/2015   Procedure: LEFT TOTAL KNEE ARTHROPLASTY;  Surgeon: Mcarthur Rossetti, MD;  Location: [place];  Service: Orthopedics;  Laterality: Left;    There were no vitals filed for this visit.      Subjective Assessment - 09/27/15 1153    Subjective I'm doing good, not having any pain at the moment.    Pertinent History Hip replacement surgery: 09/08/14, 11/02/14, Lt TKA 08/10/15   Limitations Standing;Walking   How long can you stand comfortably? 10 minutes max   How long can you walk comfortably? with cane-5 minutes   Patient Stated Goals reduce pain, improve mobility, increase strength   Currently in Pain? No/denies                         [facility] Adult PT Treatment/Exercise - 09/27/15 0001      Knee/Hip Exercises: Aerobic   Nustep L2 11 min     Knee/Hip Exercises: Standing   Hip Abduction Stengthening;Both;2 sets;10 reps  VC for posture   Hip Extension Stengthening;Both;2  sets;10 reps  VC for posture and technique   Lateral Step Up Both;1 set;10 reps;Step Height: 6"   Forward Step Up Both;1 set;10 reps;Hand Hold: 2;Step Height: 6"   Rocker Board 3 minutes   Rebounder weight shifting  1 min each; bil front-back and side-side; alt march     Knee/Hip Exercises: Seated   Long Arc Quad Strengthening;Left;1 set;20 reps   Long Arc Quad Weight --  2.5 lbs     Vasopneumatic   Number Minutes Vasopneumatic  15 minutes   Vasopnuematic Location  Knee  Lt   Vasopneumatic Pressure High   Vasopneumatic Temperature  3 snowflakes                  PT Short Term Goals - 09/20/15 1229      PT SHORT TERM GOAL #1   Title be independent in initial HEP   Time 4   Period Weeks   Status Achieved     PT SHORT TERM GOAL  #2   Title report < or = to 4/10 Lt knee pain with standing activity   Time 4   Period Weeks   Status On-going     PT SHORT TERM GOAL #3   Title stand for 15 minutes for home tasks without need to rest   Time 4   Period Weeks   Status On-going     PT SHORT TERM GOAL #4   Title improve strength to ascend steps with step-over-step gait > or = to 50% of the time   Time 4   Period Weeks   Status On-going     PT SHORT TERM GOAL #5   Title improve strength to wean from cane 50% of the time in the home and community.     Time 4   Period Weeks   Status On-going           PT Long Term Goals - 09/22/15 1205      PT LONG TERM GOAL #1   Title be independent in advanced HEP   Time 8   Period Weeks   Status On-going     PT LONG TERM GOAL #2   Title reduce FOTO to < or = to 53% limitation   Status Achieved     PT LONG TERM GOAL #3   Title demonstrate Lt knee AROM flexion to > or = to 100 degrees to improve steps and squatting   Status Achieved     PT LONG TERM GOAL #4   Title improve strength to ascend steps with step-over-step > or = to 75% of the time   Time 8   Period Weeks   Status On-going     PT LONG TERM GOAL #5   Title wean from cane > or = to 75% of the time for home and community distances   Status Achieved     PT LONG TERM GOAL #6   Title reduce Lt knee pain to < or = to 2/10 with standing tasks   Time 8   Period Weeks   Status On-going               Plan - 09/27/15 1228    Clinical Impression Statement Pt continues to note improvement of Lt knee pain and limited mostly now by Rt hip bursitis. Though is still limited functionally with strength and A/ROM of Lt knee and will benefit from continuing therapy to address these deficits.    Rehab Potential Good  PT Frequency 2x / week   PT Duration 8 weeks   PT Treatment/Interventions ADLs/Self Care Home Management;Cryotherapy;Electrical Stimulation;Functional mobility training;Stair training;Gait  training;Ultrasound;Therapeutic activities;Therapeutic exercise;Balance training;Neuromuscular re-education;Patient/family education;Manual techniques;Passive range of motion;Vasopneumatic Device;Taping   PT Next Visit Plan Standing strength, edema management, Lt knee flexibilty   Consulted and Agree with Plan of Care Patient      Patient will benefit from skilled therapeutic intervention in order to improve the following deficits and impairments:  Abnormal gait, Pain, Increased edema, Decreased strength, Decreased mobility, Decreased scar mobility, Impaired flexibility, Decreased activity tolerance, Decreased endurance, Decreased range of motion, Difficulty walking  Visit Diagnosis: Pain in left knee  Localized edema  Other abnormalities of gait and mobility  Muscle weakness (generalized)     Problem List Patient Active Problem List   Diagnosis Date Noted  . Osteoarthritis of left knee 08/10/2015  . Status post total left knee replacement 08/10/2015  . Osteoarthritis of left hip 11/02/2014  . Status post total replacement of left hip 11/02/2014  . Osteoarthritis of right hip 09/08/2014  . Status post total replacement of right hip 09/08/2014  . PMB (postmenopausal bleeding) 02/28/2013  . Morbid obesity ([place]) 02/28/2013  . Pure hypercholesterolemia 02/28/2013  . Essential hypertension, benign 02/28/2013    [name] Ann,PTA 09/27/2015, 12:33 PM  [facility] [address], [place], [place], [address] Phone: [phone]   Fax:  [phone]  Name: [name] MRN: [id number] Date of Birth: [birth year]

## 2015-09-29 ENCOUNTER — Ambulatory Visit: Payer: Medicare Other

## 2015-09-29 DIAGNOSIS — R2689 Other abnormalities of gait and mobility: Secondary | ICD-10-CM | POA: Diagnosis not present

## 2015-09-29 DIAGNOSIS — M6281 Muscle weakness (generalized): Secondary | ICD-10-CM | POA: Diagnosis not present

## 2015-09-29 DIAGNOSIS — M25562 Pain in left knee: Secondary | ICD-10-CM

## 2015-09-29 DIAGNOSIS — M25551 Pain in right hip: Secondary | ICD-10-CM

## 2015-09-29 DIAGNOSIS — R600 Localized edema: Secondary | ICD-10-CM

## 2015-09-29 NOTE — Patient Instructions (Addendum)
  Piriformis Stretch - Supine    Pull uninvolved knee across body toward opposite shoulder. Hold slight stretch for _20__ seconds.   3 Reps. Repeat with involved leg. Repeat ___ times. Do _3__ times per day.  Copyright  VHI. All rights reserved.   HIP: Hamstrings - Short Sitting    Rest leg on raised surface. Keep knee straight. Lift chest. Hold _20__ seconds. _3__ reps per set, _3__ sets per day  Knee to Chest    Lying supine, bend involved knee to chest .  Hold 20 seconds, repeat _3__ times. Repeat with other leg. Do _3__ times per day.  Copyright  VHI. All rights reserved.    Copyright  VHI. All rights reserved.  [facility] [address] [place], [place] [address] Phone # [phone] Fax [phone]

## 2015-09-29 NOTE — Therapy (Signed)
[facility] [address], [place], [place], [address] Phone: [phone]   Fax:  [phone]  Physical Therapy Evaluation  Patient Details  Name: [name] MRN: [id number] Date of Birth: [birth year] Referring Provider: Jean Rosenthal, MD  Encounter Date: 09/29/2015      PT End of Session - 09/29/15 1224    Visit Number 11   Number of Visits 19   Date for PT Re-Evaluation 10/18/15   PT Start Time [id number]   PT Stop Time 1240   PT Time Calculation (min) 55 min   Activity Tolerance Patient tolerated treatment well   Behavior During Therapy [facility] for tasks assessed/performed      Past Medical History:  Diagnosis Date  . Abnormal Pap smear of cervix 03/2009   Colpo Biopsy CIN I with HPV  . Arthritis   . Asthma   . Bilateral edema of lower extremity    Takes lasix if needed  . Bronchitis   . Disc disorder    bulging disc in thoracic area  . GERD (gastroesophageal reflux disease)    occ  . Hypercholesteremia   . Hypertension since 1990's  . Lumbar herniated disc   . Obesity   . Pneumonia    hx  . Scoliosis   . Spinal stenosis   . Teeth grinding     Past Surgical History:  Procedure Laterality Date  . APPENDECTOMY  1965  . BACK SURGERY  1993   Thoracic and Lumbar  . BREAST BIOPSY Right 1990   benign cyst  . CERVICAL [place] SURGERY  2007  . COLONOSCOPY W/ POLYPECTOMY    . [place], as child   bilateral inguinal -1 during pregnancy.  Umbilical repair as child  . KNEE ARTHROSCOPY Right 1990  . [place] SURGERY  2006  . SHOULDER ARTHROSCOPY Left 1994   arthritis, hips,fingers  . TONSILLECTOMY AND ADENOIDECTOMY    . TOTAL HIP ARTHROPLASTY Right 09/08/2014   Procedure: RIGHT TOTAL HIP ARTHROPLASTY ANTERIOR APPROACH;  Surgeon: Mcarthur Rossetti, MD;  Location: [place];  Service: Orthopedics;  Laterality: Right;  . TOTAL HIP ARTHROPLASTY Left 11/02/2014   Procedure: LEFT TOTAL HIP  ARTHROPLASTY ANTERIOR APPROACH;  Surgeon: Mcarthur Rossetti, MD;  Location: [place];  Service: Orthopedics;  Laterality: Left;  . TOTAL KNEE ARTHROPLASTY Right 2009  . TOTAL KNEE ARTHROPLASTY Left 08/10/2015  . TOTAL KNEE ARTHROPLASTY Left 08/10/2015   Procedure: LEFT TOTAL KNEE ARTHROPLASTY;  Surgeon: Mcarthur Rossetti, MD;  Location: [place];  Service: Orthopedics;  Laterality: Left;    There were no vitals filed for this visit.       Subjective Assessment - 09/29/15 1146    Subjective Pt with new order for Rt hip pain (trochanteric bursisits).  Rt hip was severe a few weeks ago.  No incident or injury.     Pertinent History Hip replacement surgery: 09/08/14, 11/02/14, Lt TKA 08/10/15.  Cortizone injection into Rt hip.   Currently in Pain? Yes   Pain Score 2    Pain Location Knee   Pain Orientation Left   Pain Descriptors / Indicators Aching;Tightness   Pain Type Surgical pain   Pain Onset More than a month ago   Pain Frequency Constant   Aggravating Factors  standing, walking, bending   Pain Relieving Factors ice, rest, elevation   Multiple Pain Sites Yes   Pain Score 4   Pain Location Hip   Pain Orientation Right   Pain  Descriptors / Indicators Sore;Shooting   Pain Type Chronic pain   Pain Onset More than a month ago   Pain Frequency Intermittent   Aggravating Factors  stretching, "certain movements", laying on Rt side   Pain Relieving Factors sitting down to ice it, [place]            [facility] PT Assessment - 09/29/15 0001      Assessment   Medical Diagnosis s/p Lt TKA, Rt hip trochanteric bursitis   Referring Provider Jean Rosenthal, MD   Next MD Visit 10/21/15     Precautions   Precautions None     Balance Screen   Has the patient fallen in the past 6 months No   Has the patient had a decrease in activity level because of a fear of falling?  No   Is the patient reluctant to leave their home because of a fear of falling?  No     Home Environment    Living Environment Private residence   Type of [place] Access Stairs to enter   Entrance Stairs-Number of Steps 7   Home Layout Two level     Prior Function   Level of Independence Independent     Observation/Other Assessments   Focus on Therapeutic Outcomes (FOTO)  52% limitation     AROM   Overall AROM  Deficits   AROM Assessment Site Knee   Right/Left Knee Left   Left Knee Extension 2   Left Knee Flexion 107     Strength   Overall Strength Deficits   Overall Strength Comments Rt hip and knee 4+/5, Lt hip 4+/5  Rt hip cramp with all testing resisted testing   Left Knee Flexion 4+/5   Left Knee Extension 4+/5     Palpation   Palpation comment Palpable tenderness over Rt gluteals, ITB and groin                   [facility] Adult PT Treatment/Exercise - 09/29/15 0001      Exercises   Exercises Lumbar     Lumbar Exercises: Stretches   Single Knee to Chest Stretch 3 reps;20 seconds   Active Hamstring Stretch 3 reps;20 seconds   Piriformis Stretch 3 reps;Right;20 seconds   Piriformis Stretch Limitations diagonal knee to chest 3x20 seconds     Knee/Hip Exercises: Aerobic   Nustep L2 11 min  PT present to discuss Rt hip pain     Knee/Hip Exercises: Standing   Rebounder weight shifting  1 min each; bil front-back and side-side; alt march     Knee/Hip Exercises: Seated   Long Arc Quad Strengthening;Left;3 sets;10 reps   Long Arc Quad Weight --  2.5 #     Vasopneumatic   Number Minutes Vasopneumatic  15 minutes   Vasopnuematic Location  Knee   Vasopneumatic Pressure High   Vasopneumatic Temperature  3 snowflakes                PT Education - 09/29/15 1223    Education provided Yes   Education Details Rt hip flexiblity   Person(s) Educated Patient   Methods Explanation;Demonstration;Handout   Comprehension Verbalized understanding          PT Short Term Goals - 09/20/15 1229      PT SHORT TERM GOAL #1   Title be independent in  initial HEP   Time 4   Period Weeks   Status Achieved     PT SHORT TERM GOAL #2  Title report < or = to 4/10 Lt knee pain with standing activity   Time 4   Period Weeks   Status On-going     PT SHORT TERM GOAL #3   Title stand for 15 minutes for home tasks without need to rest   Time 4   Period Weeks   Status On-going     PT SHORT TERM GOAL #4   Title improve strength to ascend steps with step-over-step gait > or = to 50% of the time   Time 4   Period Weeks   Status On-going     PT SHORT TERM GOAL #5   Title improve strength to wean from cane 50% of the time in the home and community.     Time 4   Period Weeks   Status On-going           PT Long Term Goals - 09/29/15 1148      PT LONG TERM GOAL #1   Title be independent in advanced HEP   Time 8   Period Weeks   Status On-going     PT LONG TERM GOAL #2   Title reduce FOTO to < or = to 53% limitation   Status Achieved     PT LONG TERM GOAL #3   Title demonstrate Lt knee AROM flexion to > or = to 105 degrees to improve steps and squatting   Time 8   Period Weeks   Status Revised     PT LONG TERM GOAL #4   Title improve strength to ascend steps with step-over-step > or = to 75% of the time   Time 8   Period Weeks   Status On-going     PT LONG TERM GOAL #5   Title wean from cane > or = to 75% of the time for home and community distances     Additional Long Term Goals   Additional Long Term Goals Yes     PT LONG TERM GOAL #6   Title reduce Lt knee pain to < or = to 2/10 with standing tasks   Time 8   Period Weeks   Status On-going     PT LONG TERM GOAL #7   Title report < or = to 3/10 Rt hip pain with standing and walking   Time 8   Period Weeks   Status New               Plan - 09/29/15 1209    Clinical Impression Statement Pt is making steady progress with rehab of Lt knee after knee replacement surgery.  Pt with new onset of Rt hip pain and was diagnosed with bursitis.  Pt had  injection into Rt trochanteric bursa and has been doing stretches for Rt hip.  Pt with palpable tenderness over Rt gluteals, hip flexor and ITB.  Pt is of moderate complexity due to multiple medical conditions including chronic LBP, joint replacements and multiple body parts/evolving condition.  Pt will continue to benefit from skilled PT for Lt knee flexiblity, strength and endurance and Rt hip flexibility and strength.  Manual and modalities as needed.     Rehab Potential Good   PT Frequency 2x / week   PT Duration 8 weeks   PT Treatment/Interventions ADLs/Self Care Home Management;Cryotherapy;Electrical Stimulation;Functional mobility training;Stair training;Gait training;Ultrasound;Therapeutic activities;Therapeutic exercise;Balance training;Neuromuscular re-education;Patient/family education;Manual techniques;Passive range of motion;Vasopneumatic Device;Taping   PT Next Visit Plan Standing strength, edema management, Lt knee flexibilty.  Rt hip strength,  flexibility and endurance.  Manual to Rt hip as needed.     Consulted and Agree with Plan of Care Patient      Patient will benefit from skilled therapeutic intervention in order to improve the following deficits and impairments:  Abnormal gait, Pain, Increased edema, Decreased strength, Decreased mobility, Decreased scar mobility, Impaired flexibility, Decreased activity tolerance, Decreased endurance, Decreased range of motion, Difficulty walking  Visit Diagnosis: Pain in left knee - Plan: PT plan of care cert/re-cert  Localized edema - Plan: PT plan of care cert/re-cert  Other abnormalities of gait and mobility - Plan: PT plan of care cert/re-cert  Muscle weakness (generalized) - Plan: PT plan of care cert/re-cert  Pain in right hip - Plan: PT plan of care cert/re-cert      G-Codes - [id number]    Functional Assessment Tool Used Clinical judegment   Functional Limitation Other PT primary   Other PT Primary Current Status  [id number]) At least 40 percent but less than 60 percent impaired, limited or restricted   Other PT Primary Goal Status [id number]) At least 40 percent but less than 60 percent impaired, limited or restricted   Other PT Primary Discharge Status [id number]) At least 40 percent but less than 60 percent impaired, limited or restricted       Problem List Patient Active Problem List   Diagnosis Date Noted  . Osteoarthritis of left knee 08/10/2015  . Status post total left knee replacement 08/10/2015  . Osteoarthritis of left hip 11/02/2014  . Status post total replacement of left hip 11/02/2014  . Osteoarthritis of right hip 09/08/2014  . Status post total replacement of right hip 09/08/2014  . PMB (postmenopausal bleeding) 02/28/2013  . Morbid obesity ([place]) 02/28/2013  . Pure hypercholesterolemia 02/28/2013  . Essential hypertension, benign 02/28/2013     Sigurd Sos, PT 09/29/15 12:28 PM  [facility] [address] [place], [place], [address] Phone: [phone]   Fax:  [phone]  Name: [name] MRN: [id number] Date of Birth: August 30, 1950

## 2015-10-04 ENCOUNTER — Ambulatory Visit: Payer: Medicare Other | Attending: Orthopaedic Surgery | Admitting: Physical Therapy

## 2015-10-04 DIAGNOSIS — M6281 Muscle weakness (generalized): Secondary | ICD-10-CM | POA: Diagnosis not present

## 2015-10-04 DIAGNOSIS — R600 Localized edema: Secondary | ICD-10-CM | POA: Diagnosis not present

## 2015-10-04 DIAGNOSIS — M25562 Pain in left knee: Secondary | ICD-10-CM | POA: Insufficient documentation

## 2015-10-04 DIAGNOSIS — M25551 Pain in right hip: Secondary | ICD-10-CM | POA: Diagnosis not present

## 2015-10-04 DIAGNOSIS — R2689 Other abnormalities of gait and mobility: Secondary | ICD-10-CM | POA: Diagnosis not present

## 2015-10-04 NOTE — Therapy (Signed)
[facility] [address], [address] [place], [place], [address] Phone: [phone]   Fax:  [phone]  Physical Therapy Treatment  Patient Details  Name: [name] MRN: [id number] Date of Birth: 01-07-1950 Referring Provider: Doneen PoissonBlackman, Christopher, MD  Encounter Date: 10/04/2015      PT End of Session - 10/04/15 1417    Visit Number 12   Number of Visits 19   Date for PT Re-Evaluation 11/24/15   PT Start Time 1410   PT Stop Time 1450   PT Time Calculation (min) 40 min   Activity Tolerance Patient tolerated treatment well   Behavior [facility] for tasks assessed/performed      Past Medical History:  Diagnosis Date  . Abnormal Pap smear of cervix 03/2009   Colpo Biopsy CIN I with HPV  . Arthritis   . Asthma   . Bilateral edema of lower extremity    Takes lasix if needed  . Bronchitis   . Disc disorder    bulging disc in thoracic area  . GERD (gastroesophageal reflux disease)    occ  . Hypercholesteremia   . Hypertension since 1990's  . Lumbar herniated disc   . Obesity   . Pneumonia    hx  . Scoliosis   . Spinal stenosis   . Teeth grinding     Past Surgical History:  Procedure Laterality Date  . APPENDECTOMY  1965  . BACK SURGERY  1993   Thoracic and Lumbar  . BREAST BIOPSY Right 1990   benign cyst  . CERVICAL [place] SURGERY  2007  . COLONOSCOPY W/ POLYPECTOMY    . HERNIA REPAIR  1977, as child   bilateral inguinal -1 during pregnancy.  Umbilical repair as child  . KNEE ARTHROSCOPY Right 1990  . LUMBAR DISC SURGERY  2006  . SHOULDER ARTHROSCOPY Left 1994   arthritis, hips,fingers  . TONSILLECTOMY AND ADENOIDECTOMY    . TOTAL HIP ARTHROPLASTY Right 09/08/2014   Procedure: RIGHT TOTAL HIP ARTHROPLASTY ANTERIOR APPROACH;  Surgeon: Kathryne Hitchhristopher Y Blackman, MD;  [facility];  Service: Orthopedics;  Laterality: Right;  . TOTAL HIP ARTHROPLASTY Left 11/02/2014   Procedure: LEFT TOTAL HIP  ARTHROPLASTY ANTERIOR APPROACH;  Surgeon: Kathryne Hitchhristopher Y Blackman, MD;  [facility];  Service: Orthopedics;  Laterality: Left;  . TOTAL KNEE ARTHROPLASTY Right 2009  . TOTAL KNEE ARTHROPLASTY Left 08/10/2015  . TOTAL KNEE ARTHROPLASTY Left 08/10/2015   Procedure: LEFT TOTAL KNEE ARTHROPLASTY;  Surgeon: Kathryne Hitchhristopher Y Blackman, MD;  [facility];  Service: Orthopedics;  Laterality: Left;    There were no vitals filed for this visit.      Subjective Assessment - 10/04/15 1412    Subjective Pt arriving to therapy complaining of low back tightness. Reporting R hip pain which is annoying pain. No knee pain reported only stiffness/tightness.    Pertinent History Hip replacement surgery: 09/08/14, 11/02/14, Lt TKA 08/10/15.  Cortizone injection into Rt hip.   Limitations Standing;Walking   How long can you stand comfortably? 10 minutes max   How long can you walk comfortably? with cane-5 minutes   Patient Stated Goals reduce pain, improve mobility, increase strength   Currently in Pain? Yes   Pain Score 3    Pain Location Hip   Pain Orientation Right   Pain Descriptors / Indicators Aching   Pain Type Chronic pain   Pain Onset More than a month ago   Pain Frequency Intermittent   Multiple Pain Sites Yes  Pain Score 1   Pain Location Knee   Pain Orientation Left   Pain Descriptors / Indicators Aching   Pain Onset More than a month ago   Pain Frequency Intermittent                         [facility] Adult PT Treatment/Exercise - 10/04/15 0001      Lumbar Exercises: Stretches   Single Knee to Chest Stretch 3 reps;20 seconds     Knee/Hip Exercises: Aerobic   Nustep L2 8 minutes  PT present to discuss Rt hip pain     Knee/Hip Exercises: Seated   Long Arc Quad Strengthening;Left;3 sets;10 reps     Knee/Hip Exercises: Supine   Straight Leg Raises 1 set;10 reps   Straight Leg Raises Limitations opposite knee bent   Other Supine Knee/Hip Exercises Bridge: x 10 holdinf 3  seconds. Pt instructed to perform PPT pior to lifting.    Other Supine Knee/Hip Exercises Passive piriformis stretch: x 3 each LE, hip flexor stretch over side of mat table x 2 reps.      Knee/Hip Exercises: Sidelying   Hip ABduction --  attempted, but pt couldn't tolerate the position     Vasopneumatic   Number Minutes Vasopneumatic  15 minutes   Vasopnuematic Location  Knee   Vasopneumatic Pressure High   Vasopneumatic Temperature  3 snowflakes     Manual Therapy   Manual Therapy Soft tissue mobilization   Manual therapy comments R hip and proximal IT band                  PT Short Term Goals - 10/04/15 1420      PT SHORT TERM GOAL #1   Title be independent in initial HEP   Time 4   Period Weeks   Status Achieved     PT SHORT TERM GOAL #2   Title report < or = to 4/10 Lt knee pain with standing activity   Time 4   Period Weeks   Status On-going     PT SHORT TERM GOAL #3   Title stand for 15 minutes for home tasks without need to rest   Time 4   Period Weeks   Status On-going     PT SHORT TERM GOAL #4   Title improve strength to ascend steps with step-over-step gait > or = to 50% of the time   Time 4   Period Weeks   Status On-going     PT SHORT TERM GOAL #5   Title improve strength to wean from cane 50% of the time in the home and community.     Time 4   Period Weeks   Status On-going           PT Long Term Goals - 10/04/15 1421      PT LONG TERM GOAL #1   Title be independent in advanced HEP   Time 8   Period Weeks   Status On-going     PT LONG TERM GOAL #2   Title reduce FOTO to < or = to 53% limitation   Time 8   Period Weeks   Status Achieved     PT LONG TERM GOAL #3   Title demonstrate Lt knee AROM flexion to > or = to 105 degrees to improve steps and squatting   Time 8   Period Weeks   Status Revised     PT LONG TERM GOAL #  4   Title improve strength to ascend steps with step-over-step > or = to 75% of the time   Time 8    Period Weeks   Status On-going     PT LONG TERM GOAL #5   Title wean from cane > or = to 75% of the time for home and community distances   Time 8   Period Weeks   Status Achieved     PT LONG TERM GOAL #6   Title reduce Lt knee pain to < or = to 2/10 with standing tasks   Time 8   Period Weeks   Status On-going     PT LONG TERM GOAL #7   Title report < or = to 3/10 Rt hip pain with standing and walking   Time 8   Period Weeks   Status New             Patient will benefit from skilled therapeutic intervention in order to improve the following deficits and impairments:     Visit Diagnosis: Acute pain of left knee  Localized edema  Other abnormalities of gait and mobility  Muscle weakness (generalized)  Pain in right hip     Problem List Patient Active Problem List   Diagnosis Date Noted  . Osteoarthritis of left knee 08/10/2015  . Status post total left knee replacement 08/10/2015  . Osteoarthritis of left hip 11/02/2014  . Status post total replacement of left hip 11/02/2014  . Osteoarthritis of right hip 09/08/2014  . Status post total replacement of right hip 09/08/2014  . PMB (postmenopausal bleeding) 02/28/2013  . Morbid obesity ([place]) 02/28/2013  . Pure hypercholesterolemia 02/28/2013  . Essential hypertension, benign 02/28/2013    Montez Morita 10/04/2015, 3:09 PM  [facility] [address], [place], [place], [address] Phone: [phone]   Fax:  [phone]  Name: [name] MRN: [id number] Date of Birth: 1950-04-05

## 2015-10-06 ENCOUNTER — Encounter: Payer: Self-pay | Admitting: Physical Therapy

## 2015-10-06 ENCOUNTER — Ambulatory Visit: Payer: Medicare Other | Admitting: Physical Therapy

## 2015-10-06 DIAGNOSIS — M25562 Pain in left knee: Secondary | ICD-10-CM | POA: Diagnosis not present

## 2015-10-06 DIAGNOSIS — M6281 Muscle weakness (generalized): Secondary | ICD-10-CM

## 2015-10-06 DIAGNOSIS — R2689 Other abnormalities of gait and mobility: Secondary | ICD-10-CM | POA: Diagnosis not present

## 2015-10-06 DIAGNOSIS — R600 Localized edema: Secondary | ICD-10-CM | POA: Diagnosis not present

## 2015-10-06 DIAGNOSIS — M25551 Pain in right hip: Secondary | ICD-10-CM | POA: Diagnosis not present

## 2015-10-06 NOTE — Therapy (Signed)
[facility] [address] [place], [place], [address] Phone: [phone]   Fax:  [phone]  Physical Therapy Treatment  Patient Details  Name: [name] MRN: [id number] Date of Birth: 1950/10/24 Referring Provider: Jean Rosenthal, MD  Encounter Date: 10/06/2015      PT End of Session - 10/06/15 1239    Visit Number 13   Number of Visits 19   Date for PT Re-Evaluation 11/24/15   PT Start Time [id number]   PT Stop Time 1245   PT Time Calculation (min) 57 min   Activity Tolerance Patient tolerated treatment well   Behavior During Therapy [facility] for tasks assessed/performed      Past Medical History:  Diagnosis Date  . Abnormal Pap smear of cervix 03/2009   Colpo Biopsy CIN I with HPV  . Arthritis   . Asthma   . Bilateral edema of lower extremity    Takes lasix if needed  . Bronchitis   . Disc disorder    bulging disc in thoracic area  . GERD (gastroesophageal reflux disease)    occ  . Hypercholesteremia   . Hypertension since 1990's  . Lumbar herniated disc   . Obesity   . Pneumonia    hx  . Scoliosis   . Spinal stenosis   . Teeth grinding     Past Surgical History:  Procedure Laterality Date  . APPENDECTOMY  1965  . BACK SURGERY  1993   Thoracic and Lumbar  . BREAST BIOPSY Right 1990   benign cyst  . CERVICAL [place] SURGERY  2007  . COLONOSCOPY W/ POLYPECTOMY    . [place], as child   bilateral inguinal -1 during pregnancy.  Umbilical repair as child  . KNEE ARTHROSCOPY Right 1990  . [place] SURGERY  2006  . SHOULDER ARTHROSCOPY Left 1994   arthritis, hips,fingers  . TONSILLECTOMY AND ADENOIDECTOMY    . TOTAL HIP ARTHROPLASTY Right 09/08/2014   Procedure: RIGHT TOTAL HIP ARTHROPLASTY ANTERIOR APPROACH;  Surgeon: Mcarthur Rossetti, MD;  Location: [place];  Service: Orthopedics;  Laterality: Right;  . TOTAL HIP ARTHROPLASTY Left 11/02/2014   Procedure: LEFT TOTAL HIP  ARTHROPLASTY ANTERIOR APPROACH;  Surgeon: Mcarthur Rossetti, MD;  Location: [place];  Service: Orthopedics;  Laterality: Left;  . TOTAL KNEE ARTHROPLASTY Right 2009  . TOTAL KNEE ARTHROPLASTY Left 08/10/2015  . TOTAL KNEE ARTHROPLASTY Left 08/10/2015   Procedure: LEFT TOTAL KNEE ARTHROPLASTY;  Surgeon: Mcarthur Rossetti, MD;  Location: [place];  Service: Orthopedics;  Laterality: Left;    There were no vitals filed for this visit.      Subjective Assessment - 10/06/15 1151    Subjective Pt repotrs having 10/10 pain yesterday in Rt hip. Feels much better today.    Patient is accompained by: Family member   Pertinent History Hip replacement surgery: 09/08/14, 11/02/14, Lt TKA 08/10/15.  Cortizone injection into Rt hip.   Limitations Standing;Walking   Currently in Pain? Yes   Pain Score 2    Pain Location Knee   Pain Orientation Left   Pain Descriptors / Indicators Tightness   Pain Type Acute pain   Multiple Pain Sites No                         [facility] Adult PT Treatment/Exercise - 10/06/15 0001      Knee/Hip Exercises: Aerobic   Stationary Bike L1 7 minutes  while therapist  assesing pain     Knee/Hip Exercises: Machines for Strengthening   Cybex Knee Extension 3x10 #15   Cybex Knee Flexion 3x10 #15     Knee/Hip Exercises: Standing   Hip Flexion Stengthening;Both;2 sets;10 reps   Hip Extension Stengthening;Both;2 sets;10 reps   Lateral Step Up 1 set;Step Height: 4"  Aggrivated Rt hip   Forward Step Up 10 reps;Hand Hold: 2   Rebounder --  1 min each; bil front-back and side-side; alt march     Knee/Hip Exercises: Supine   Terminal Knee Extension 1 set;20 reps;Theraband   Theraband Level (Terminal Knee Extension) Level 2 (Red)     Vasopneumatic   Number Minutes Vasopneumatic  15 minutes   Vasopnuematic Location  Knee   Vasopneumatic Pressure High   Vasopneumatic Temperature  3 snowflakes                  PT Short Term Goals - 10/04/15 1420       PT SHORT TERM GOAL #1   Title be independent in initial HEP   Time 4   Period Weeks   Status Achieved     PT SHORT TERM GOAL #2   Title report < or = to 4/10 Lt knee pain with standing activity   Time 4   Period Weeks   Status On-going     PT SHORT TERM GOAL #3   Title stand for 15 minutes for home tasks without need to rest   Time 4   Period Weeks   Status On-going     PT SHORT TERM GOAL #4   Title improve strength to ascend steps with step-over-step gait > or = to 50% of the time   Time 4   Period Weeks   Status On-going     PT SHORT TERM GOAL #5   Title improve strength to wean from cane 50% of the time in the home and community.     Time 4   Period Weeks   Status On-going           PT Long Term Goals - 10/04/15 1421      PT LONG TERM GOAL #1   Title be independent in advanced HEP   Time 8   Period Weeks   Status On-going     PT LONG TERM GOAL #2   Title reduce FOTO to < or = to 53% limitation   Time 8   Period Weeks   Status Achieved     PT LONG TERM GOAL #3   Title demonstrate Lt knee AROM flexion to > or = to 105 degrees to improve steps and squatting   Time 8   Period Weeks   Status Revised     PT LONG TERM GOAL #4   Title improve strength to ascend steps with step-over-step > or = to 75% of the time   Time 8   Period Weeks   Status On-going     PT LONG TERM GOAL #5   Title wean from cane > or = to 75% of the time for home and community distances   Time 8   Period Weeks   Status Achieved     PT LONG TERM GOAL #6   Title reduce Lt knee pain to < or = to 2/10 with standing tasks   Time 8   Period Weeks   Status On-going     PT LONG TERM GOAL #7   Title report < or = to 3/10  Rt hip pain with standing and walking   Time 8   Period Weeks   Status New               Plan - 10/06/15 1231    Clinical Impression Statement Pt had increased hip pain after lsat session. Pt reports hip feeling better today but continues to have  some pain with certain exercises. Will continue with plan of care with focus on Lt knee. Pt continues to have some difficulty with decending stairs do to decrease eccentric Lt LE strength. Pt would continue to benefit from skilled therapy for strenghtneing and stabilization for Lt knee.  Therapist monitoring pain and giving cues for good technique during treatment.    Rehab Potential Good   PT Frequency 2x / week   PT Duration 8 weeks   PT Treatment/Interventions ADLs/Self Care Home Management;Cryotherapy;Electrical Stimulation;Functional mobility training;Stair training;Gait training;Ultrasound;Therapeutic activities;Therapeutic exercise;Balance training;Neuromuscular re-education;Patient/family education;Manual techniques;Passive range of motion;Vasopneumatic Device;Taping   PT Next Visit Plan Standing strength, edema management, Lt knee flexibilty.  Rt hip strength, flexibility and endurance.     Consulted and Agree with Plan of Care Patient      Patient will benefit from skilled therapeutic intervention in order to improve the following deficits and impairments:  Abnormal gait, Pain, Increased edema, Decreased strength, Decreased mobility, Decreased scar mobility, Impaired flexibility, Decreased activity tolerance, Decreased endurance, Decreased range of motion, Difficulty walking  Visit Diagnosis: Acute pain of left knee  Localized edema  Other abnormalities of gait and mobility  Muscle weakness (generalized)  Pain in right hip     Problem List Patient Active Problem List   Diagnosis Date Noted  . Osteoarthritis of left knee 08/10/2015  . Status post total left knee replacement 08/10/2015  . Osteoarthritis of left hip 11/02/2014  . Status post total replacement of left hip 11/02/2014  . Osteoarthritis of right hip 09/08/2014  . Status post total replacement of right hip 09/08/2014  . PMB (postmenopausal bleeding) 02/28/2013  . Morbid obesity ([place]) 02/28/2013  . Pure  hypercholesterolemia 02/28/2013  . Essential hypertension, benign 02/28/2013    Mikle Bosworth PTA 10/06/2015, 12:45 PM  [facility] [address] [place], [place], [address] Phone: [phone]   Fax:  [phone]  Name: LIVIAN SANDLIN MRN: [id number] Date of Birth: [birth year]

## 2015-10-11 ENCOUNTER — Ambulatory Visit: Payer: Medicare Other

## 2015-10-11 DIAGNOSIS — R2689 Other abnormalities of gait and mobility: Secondary | ICD-10-CM

## 2015-10-11 DIAGNOSIS — R600 Localized edema: Secondary | ICD-10-CM

## 2015-10-11 DIAGNOSIS — M6281 Muscle weakness (generalized): Secondary | ICD-10-CM | POA: Diagnosis not present

## 2015-10-11 DIAGNOSIS — M25551 Pain in right hip: Secondary | ICD-10-CM

## 2015-10-11 DIAGNOSIS — M25562 Pain in left knee: Secondary | ICD-10-CM

## 2015-10-11 NOTE — Therapy (Signed)
[facility] [address], [place], [place], [address] Phone: [phone]   Fax:  [phone]  Physical Therapy Treatment  Patient Details  Name: [name] MRN: [id number] Date of Birth: 1950-02-20 Referring Provider: Jean Rosenthal, MD  Encounter Date: 10/11/2015      PT End of Session - 10/11/15 1225    Visit Number 14   Number of Visits 19   Date for PT Re-Evaluation 11/24/15   PT Start Time 1143   PT Stop Time 1240   PT Time Calculation (min) 57 min   Activity Tolerance Patient tolerated treatment well   Behavior During Therapy [facility] for tasks assessed/performed      Past Medical History:  Diagnosis Date  . Abnormal Pap smear of cervix 03/2009   Colpo Biopsy CIN I with HPV  . Arthritis   . Asthma   . Bilateral edema of lower extremity    Takes lasix if needed  . Bronchitis   . Disc disorder    bulging disc in thoracic area  . GERD (gastroesophageal reflux disease)    occ  . Hypercholesteremia   . Hypertension since 1990's  . Lumbar herniated disc   . Obesity   . Pneumonia    hx  . Scoliosis   . Spinal stenosis   . Teeth grinding     Past Surgical History:  Procedure Laterality Date  . APPENDECTOMY  1965  . BACK SURGERY  1993   Thoracic and Lumbar  . BREAST BIOPSY Right 1990   benign cyst  . CERVICAL [place] SURGERY  2007  . COLONOSCOPY W/ POLYPECTOMY    . [place], as child   bilateral inguinal -1 during pregnancy.  Umbilical repair as child  . KNEE ARTHROSCOPY Right 1990  . [place] SURGERY  2006  . SHOULDER ARTHROSCOPY Left 1994   arthritis, hips,fingers  . TONSILLECTOMY AND ADENOIDECTOMY    . TOTAL HIP ARTHROPLASTY Right 09/08/2014   Procedure: RIGHT TOTAL HIP ARTHROPLASTY ANTERIOR APPROACH;  Surgeon: Mcarthur Rossetti, MD;  Location: [place];  Service: Orthopedics;  Laterality: Right;  . TOTAL HIP ARTHROPLASTY Left 11/02/2014   Procedure: LEFT TOTAL HIP  ARTHROPLASTY ANTERIOR APPROACH;  Surgeon: Mcarthur Rossetti, MD;  Location: [place];  Service: Orthopedics;  Laterality: Left;  . TOTAL KNEE ARTHROPLASTY Right 2009  . TOTAL KNEE ARTHROPLASTY Left 08/10/2015  . TOTAL KNEE ARTHROPLASTY Left 08/10/2015   Procedure: LEFT TOTAL KNEE ARTHROPLASTY;  Surgeon: Mcarthur Rossetti, MD;  Location: [place];  Service: Orthopedics;  Laterality: Left;    There were no vitals filed for this visit.      Subjective Assessment - 10/11/15 1204    Subjective Rt hip is feeling better.     Pertinent History Hip replacement surgery: 09/08/14, 11/02/14, Lt TKA 08/10/15.  Cortizone injection into Rt hip.   Currently in Pain? Yes   Pain Score 3    Pain Location Knee   Pain Orientation Left   Pain Descriptors / Indicators Tightness   Pain Type Acute pain   Pain Onset More than a month ago   Pain Frequency Intermittent   Aggravating Factors  standing, walking, bending    Pain Relieving Factors ice, rest, elevation   Pain Score 4   Pain Location Hip   Pain Orientation Right   Pain Descriptors / Indicators Aching   Pain Type Chronic pain   Pain Frequency Intermittent  [place] Adult PT Treatment/Exercise - 10/11/15 0001      Lumbar Exercises: Stretches   Active Hamstring Stretch 3 reps;20 seconds     Knee/Hip Exercises: Aerobic   Stationary Bike L1 8 minutes  while therapist assesing pain     Knee/Hip Exercises: Machines for Strengthening   Cybex Knee Extension 3x10 #15   Cybex Knee Flexion 3x10 #15     Knee/Hip Exercises: Standing   Hip Flexion Stengthening;Both;2 sets;10 reps   Hip Extension Stengthening;Both;2 sets;10 reps   Forward Step Up 10 reps;Hand Hold: 2;2 sets;Left;Step Height: 6"   Rocker Board 3 minutes     Vasopneumatic   Number Minutes Vasopneumatic  15 minutes   Vasopnuematic Location  Knee   Vasopneumatic Pressure High   Vasopneumatic Temperature  3 snowflakes                   PT Short Term Goals - 10/11/15 1156      PT SHORT TERM GOAL #3   Title stand for 15 minutes for home tasks without need to rest   Time 4   Period Weeks   Status On-going  8-10 minutes- limited by LBp     PT SHORT TERM GOAL #4   Title improve strength to ascend steps with step-over-step gait > or = to 50% of the time   Status Achieved     PT SHORT TERM GOAL #5   Title improve strength to wean from cane 50% of the time in the home and community.     Status Achieved           PT Long Term Goals - 10/04/15 1421      PT LONG TERM GOAL #1   Title be independent in advanced HEP   Time 8   Period Weeks   Status On-going     PT LONG TERM GOAL #2   Title reduce FOTO to < or = to 53% limitation   Time 8   Period Weeks   Status Achieved     PT LONG TERM GOAL #3   Title demonstrate Lt knee AROM flexion to > or = to 105 degrees to improve steps and squatting   Time 8   Period Weeks   Status Revised     PT LONG TERM GOAL #4   Title improve strength to ascend steps with step-over-step > or = to 75% of the time   Time 8   Period Weeks   Status On-going     PT LONG TERM GOAL #5   Title wean from cane > or = to 75% of the time for home and community distances   Time 8   Period Weeks   Status Achieved     PT LONG TERM GOAL #6   Title reduce Lt knee pain to < or = to 2/10 with standing tasks   Time 8   Period Weeks   Status On-going     PT LONG TERM GOAL #7   Title report < or = to 3/10 Rt hip pain with standing and walking   Time 8   Period Weeks   Status New               Plan - 10/11/15 1159    Clinical Impression Statement Pt is able to stand and walk up to 8-10 minutes.  This is limited by Rt hip pain and LBP most of the time.  Pt has weaned from her cane 100% in the  home and ~50% in the community.  Pt with continued knee weakness and stiffess s/p TKA.  Pt will continue to benefit from skilled PT for Rt knee strength, AROM and edema management.     Rehab  Potential Good   PT Frequency 2x / week   PT Duration 8 weeks   PT Treatment/Interventions ADLs/Self Care Home Management;Cryotherapy;Electrical Stimulation;Functional mobility training;Stair training;Gait training;Ultrasound;Therapeutic activities;Therapeutic exercise;Balance training;Neuromuscular re-education;Patient/family education;Manual techniques;Passive range of motion;Vasopneumatic Device;Taping   PT Next Visit Plan Standing strength, edema management, Lt knee flexibilty.  Rt hip strength, flexibility and endurance.     Consulted and Agree with Plan of Care Patient      Patient will benefit from skilled therapeutic intervention in order to improve the following deficits and impairments:  Abnormal gait, Pain, Increased edema, Decreased strength, Decreased mobility, Decreased scar mobility, Impaired flexibility, Decreased activity tolerance, Decreased endurance, Decreased range of motion, Difficulty walking  Visit Diagnosis: Acute pain of left knee  Localized edema  Other abnormalities of gait and mobility  Muscle weakness (generalized)  Pain in right hip     Problem List Patient Active Problem List   Diagnosis Date Noted  . Osteoarthritis of left knee 08/10/2015  . Status post total left knee replacement 08/10/2015  . Osteoarthritis of left hip 11/02/2014  . Status post total replacement of left hip 11/02/2014  . Osteoarthritis of right hip 09/08/2014  . Status post total replacement of right hip 09/08/2014  . PMB (postmenopausal bleeding) 02/28/2013  . Morbid obesity ([place]) 02/28/2013  . Pure hypercholesterolemia 02/28/2013  . Essential hypertension, benign 02/28/2013     Sigurd Sos, PT 10/11/15 12:28 PM   [facility] [address] [place], [place], [address] Phone: [phone]   Fax:  [phone]  Name: [name] MRN: [id number] Date of Birth: January 27, 1950

## 2015-10-12 DIAGNOSIS — Z23 Encounter for immunization: Secondary | ICD-10-CM | POA: Diagnosis not present

## 2015-10-14 ENCOUNTER — Encounter: Payer: BC Managed Care – PPO | Admitting: Physical Therapy

## 2015-10-14 ENCOUNTER — Ambulatory Visit (INDEPENDENT_AMBULATORY_CARE_PROVIDER_SITE_OTHER): Payer: Medicare Other | Admitting: Orthopaedic Surgery

## 2015-10-14 DIAGNOSIS — M25512 Pain in left shoulder: Secondary | ICD-10-CM | POA: Diagnosis not present

## 2015-10-14 DIAGNOSIS — M25562 Pain in left knee: Secondary | ICD-10-CM | POA: Diagnosis not present

## 2015-10-18 ENCOUNTER — Ambulatory Visit: Payer: Medicare Other | Admitting: Physical Therapy

## 2015-10-18 ENCOUNTER — Encounter: Payer: Self-pay | Admitting: Physical Therapy

## 2015-10-18 DIAGNOSIS — R2689 Other abnormalities of gait and mobility: Secondary | ICD-10-CM

## 2015-10-18 DIAGNOSIS — M25562 Pain in left knee: Secondary | ICD-10-CM | POA: Diagnosis not present

## 2015-10-18 DIAGNOSIS — M6281 Muscle weakness (generalized): Secondary | ICD-10-CM

## 2015-10-18 DIAGNOSIS — R600 Localized edema: Secondary | ICD-10-CM

## 2015-10-18 DIAGNOSIS — M25551 Pain in right hip: Secondary | ICD-10-CM | POA: Diagnosis not present

## 2015-10-18 NOTE — Therapy (Signed)
[facility] [address] [place], [place], [address] Phone: [phone]   Fax:  [phone]  Physical Therapy Treatment  Patient Details  Name: [name] MRN: [id number] Date of Birth: 12/24/1950 Referring Provider: Jean Rosenthal, MD  Encounter Date: 10/18/2015      PT End of Session - 10/18/15 1145    Visit Number 15   Number of Visits 19   Date for PT Re-Evaluation 11/24/15   PT Start Time 3244   PT Stop Time 1240   PT Time Calculation (min) 57 min   Activity Tolerance Patient tolerated treatment well   Behavior During Therapy [facility] for tasks assessed/performed      Past Medical History:  Diagnosis Date  . Abnormal Pap smear of cervix 03/2009   Colpo Biopsy CIN I with HPV  . Arthritis   . Asthma   . Bilateral edema of lower extremity    Takes lasix if needed  . Bronchitis   . Disc disorder    bulging disc in thoracic area  . GERD (gastroesophageal reflux disease)    occ  . Hypercholesteremia   . Hypertension since 1990's  . Lumbar herniated disc   . Obesity   . Pneumonia    hx  . Scoliosis   . Spinal stenosis   . Teeth grinding     Past Surgical History:  Procedure Laterality Date  . APPENDECTOMY  1965  . BACK SURGERY  1993   Thoracic and Lumbar  . BREAST BIOPSY Right 1990   benign cyst  . CERVICAL [place] SURGERY  2007  . COLONOSCOPY W/ POLYPECTOMY    . [place], as child   bilateral inguinal -1 during pregnancy.  Umbilical repair as child  . KNEE ARTHROSCOPY Right 1990  . [place] SURGERY  2006  . SHOULDER ARTHROSCOPY Left 1994   arthritis, hips,fingers  . TONSILLECTOMY AND ADENOIDECTOMY    . TOTAL HIP ARTHROPLASTY Right 09/08/2014   Procedure: RIGHT TOTAL HIP ARTHROPLASTY ANTERIOR APPROACH;  Surgeon: Mcarthur Rossetti, MD;  Location: [place];  Service: Orthopedics;  Laterality: Right;  . TOTAL HIP ARTHROPLASTY Left 11/02/2014   Procedure: LEFT TOTAL HIP  ARTHROPLASTY ANTERIOR APPROACH;  Surgeon: Mcarthur Rossetti, MD;  Location: [place];  Service: Orthopedics;  Laterality: Left;  . TOTAL KNEE ARTHROPLASTY Right 2009  . TOTAL KNEE ARTHROPLASTY Left 08/10/2015  . TOTAL KNEE ARTHROPLASTY Left 08/10/2015   Procedure: LEFT TOTAL KNEE ARTHROPLASTY;  Surgeon: Mcarthur Rossetti, MD;  Location: [place];  Service: Orthopedics;  Laterality: Left;    There were no vitals filed for this visit.      Subjective Assessment - 10/18/15 1144    Subjective Knee is sore from fall, she fell over a brick paver. MD has checked her out already, she is just sore.    Currently in Pain? Yes   Pain Score 3    Pain Location Knee   Pain Orientation Left   Pain Descriptors / Indicators Sore   Aggravating Factors  From fall   Pain Relieving Factors Ice   Multiple Pain Sites No                         [facility] Adult PT Treatment/Exercise - 10/18/15 0001      Knee/Hip Exercises: Aerobic   Stationary Bike L1 x 10 min  Concurrent review of status     Knee/Hip Exercises: Machines for Strengthening   Cybex  Knee Extension 3x10 #15  Eccentrics: up with 2 down with LT only   Cybex Knee Flexion 3x10 #15  Eccentrics     Knee/Hip Exercises: Standing   Lateral Step Up Left;2 sets;10 reps;Hand Hold: 2;Step Height: 6"   Forward Step Up Left;2 sets;10 reps;Hand Hold: 1;Step Height: 6"   Rocker Board 3 minutes     Vasopneumatic   Number Minutes Vasopneumatic  15 minutes   Vasopnuematic Location  Knee   Vasopneumatic Pressure High   Vasopneumatic Temperature  3 snowflakes                  PT Short Term Goals - 10/18/15 1158      PT SHORT TERM GOAL #2   Title report < or = to 4/10 Lt knee pain with standing activity   Time 4   Period Weeks   Status Achieved  1-3/10     PT SHORT TERM GOAL #3   Title stand for 15 minutes for home tasks without need to rest   Time 4   Period Weeks   Status Achieved  In regards to the knee it does  not limit her standing, it is her back  that limits.            PT Long Term Goals - 10/04/15 1421      PT LONG TERM GOAL #1   Title be independent in advanced HEP   Time 8   Period Weeks   Status On-going     PT LONG TERM GOAL #2   Title reduce FOTO to < or = to 53% limitation   Time 8   Period Weeks   Status Achieved     PT LONG TERM GOAL #3   Title demonstrate Lt knee AROM flexion to > or = to 105 degrees to improve steps and squatting   Time 8   Period Weeks   Status Revised     PT LONG TERM GOAL #4   Title improve strength to ascend steps with step-over-step > or = to 75% of the time   Time 8   Period Weeks   Status On-going     PT LONG TERM GOAL #5   Title wean from cane > or = to 75% of the time for home and community distances   Time 8   Period Weeks   Status Achieved     PT LONG TERM GOAL #6   Title reduce Lt knee pain to < or = to 2/10 with standing tasks   Time 8   Period Weeks   Status On-going     PT LONG TERM GOAL #7   Title report < or = to 3/10 Rt hip pain with standing and walking   Time 8   Period Weeks   Status New               Plan - 10/18/15 1146    Clinical Impression Statement All STGs met as of today. Pt did have a stumble last week on her LT side. She has been checked out by her MD with probable just soft tissue injury.    Rehab Potential Good   PT Frequency 2x / week   PT Duration 8 weeks   PT Treatment/Interventions ADLs/Self Care Home Management;Cryotherapy;Electrical Stimulation;Functional mobility training;Stair training;Gait training;Ultrasound;Therapeutic activities;Therapeutic exercise;Balance training;Neuromuscular re-education;Patient/family education;Manual techniques;Passive range of motion;Vasopneumatic Device;Taping   PT Next Visit Plan Standing strength, edema management, Lt knee flexibilty.  Rt hip strength, flexibility and endurance.  Consulted and Agree with Plan of Care Patient      Patient will  benefit from skilled therapeutic intervention in order to improve the following deficits and impairments:  Abnormal gait, Pain, Increased edema, Decreased strength, Decreased mobility, Decreased scar mobility, Impaired flexibility, Decreased activity tolerance, Decreased endurance, Decreased range of motion, Difficulty walking  Visit Diagnosis: Acute pain of left knee  Localized edema  Other abnormalities of gait and mobility  Muscle weakness (generalized)     Problem List Patient Active Problem List   Diagnosis Date Noted  . Osteoarthritis of left knee 08/10/2015  . Status post total left knee replacement 08/10/2015  . Osteoarthritis of left hip 11/02/2014  . Status post total replacement of left hip 11/02/2014  . Osteoarthritis of right hip 09/08/2014  . Status post total replacement of right hip 09/08/2014  . PMB (postmenopausal bleeding) 02/28/2013  . Morbid obesity ([place]) 02/28/2013  . Pure hypercholesterolemia 02/28/2013  . Essential hypertension, benign 02/28/2013    Hiroto Saltzman, PTA 10/18/2015, 12:20 PM  [facility] [address] [place], [place], [address] Phone: [phone]   Fax:  [phone]  Name: ZOIEY CHRISTY MRN: [id number] Date of Birth: Aug 15, 1950

## 2015-10-21 ENCOUNTER — Ambulatory Visit (INDEPENDENT_AMBULATORY_CARE_PROVIDER_SITE_OTHER): Payer: Medicare Other | Admitting: Orthopaedic Surgery

## 2015-10-21 ENCOUNTER — Ambulatory Visit (INDEPENDENT_AMBULATORY_CARE_PROVIDER_SITE_OTHER): Payer: BC Managed Care – PPO | Admitting: Orthopaedic Surgery

## 2015-10-21 ENCOUNTER — Ambulatory Visit: Payer: Medicare Other

## 2015-10-21 DIAGNOSIS — R2689 Other abnormalities of gait and mobility: Secondary | ICD-10-CM | POA: Diagnosis not present

## 2015-10-21 DIAGNOSIS — R600 Localized edema: Secondary | ICD-10-CM

## 2015-10-21 DIAGNOSIS — M6281 Muscle weakness (generalized): Secondary | ICD-10-CM | POA: Diagnosis not present

## 2015-10-21 DIAGNOSIS — M25551 Pain in right hip: Secondary | ICD-10-CM | POA: Diagnosis not present

## 2015-10-21 DIAGNOSIS — M25562 Pain in left knee: Secondary | ICD-10-CM | POA: Diagnosis not present

## 2015-10-21 NOTE — Therapy (Signed)
[facility] [address] [place], [place], [address] Phone: [phone]   Fax:  [phone]  Physical Therapy Treatment  Patient Details  Name: [name] MRN: [id number] Date of Birth: 11-20-1950 Referring Provider: Jean Rosenthal, MD  Encounter Date: 10/21/2015      PT End of Session - 10/21/15 1257    Visit Number 16   Number of Visits 19   Date for PT Re-Evaluation 11/24/15   Authorization Type KX modifier on all visits, G-codes at visit 62   PT Start Time 1223   PT Stop Time 1315   PT Time Calculation (min) 52 min   Activity Tolerance Patient limited by pain   Behavior During Therapy [facility] for tasks assessed/performed      Past Medical History:  Diagnosis Date  . Abnormal Pap smear of cervix 03/2009   Colpo Biopsy CIN I with HPV  . Arthritis   . Asthma   . Bilateral edema of lower extremity    Takes lasix if needed  . Bronchitis   . Disc disorder    bulging disc in thoracic area  . GERD (gastroesophageal reflux disease)    occ  . Hypercholesteremia   . Hypertension since 1990's  . Lumbar herniated disc   . Obesity   . Pneumonia    hx  . Scoliosis   . Spinal stenosis   . Teeth grinding     Past Surgical History:  Procedure Laterality Date  . APPENDECTOMY  1965  . BACK SURGERY  1993   Thoracic and Lumbar  . BREAST BIOPSY Right 1990   benign cyst  . CERVICAL [place] SURGERY  2007  . COLONOSCOPY W/ POLYPECTOMY    . [place], as child   bilateral inguinal -1 during pregnancy.  Umbilical repair as child  . KNEE ARTHROSCOPY Right 1990  . [place] SURGERY  2006  . SHOULDER ARTHROSCOPY Left 1994   arthritis, hips,fingers  . TONSILLECTOMY AND ADENOIDECTOMY    . TOTAL HIP ARTHROPLASTY Right 09/08/2014   Procedure: RIGHT TOTAL HIP ARTHROPLASTY ANTERIOR APPROACH;  Surgeon: Mcarthur Rossetti, MD;  Location: [place];  Service: Orthopedics;  Laterality: Right;  . TOTAL HIP  ARTHROPLASTY Left 11/02/2014   Procedure: LEFT TOTAL HIP ARTHROPLASTY ANTERIOR APPROACH;  Surgeon: Mcarthur Rossetti, MD;  Location: [place];  Service: Orthopedics;  Laterality: Left;  . TOTAL KNEE ARTHROPLASTY Right 2009  . TOTAL KNEE ARTHROPLASTY Left 08/10/2015  . TOTAL KNEE ARTHROPLASTY Left 08/10/2015   Procedure: LEFT TOTAL KNEE ARTHROPLASTY;  Surgeon: Mcarthur Rossetti, MD;  Location: [place];  Service: Orthopedics;  Laterality: Left;    There were no vitals filed for this visit.      Subjective Assessment - 10/21/15 1226    Subjective Lt knee is feeling good.  Rt hip was sore after last session, not sure if therapy was the cause.   Pertinent History Hip replacement surgery: 09/08/14, 11/02/14, Lt TKA 08/10/15.  Cortizone injection into Rt hip.   Currently in Pain? Yes   Pain Score 2    Pain Location Knee   Pain Orientation Left   Pain Descriptors / Indicators Sore   Pain Score 4   Pain Location Hip   Pain Orientation Right   Pain Descriptors / Indicators Aching   Pain Type Chronic pain   Pain Onset More than a month ago   Pain Frequency Intermittent  [place] Adult PT Treatment/Exercise - 10/21/15 0001      Lumbar Exercises: Stretches   Active Hamstring Stretch 3 reps;20 seconds     Knee/Hip Exercises: Aerobic   Stationary Bike L1 x 10 min  Concurrent review of status     Knee/Hip Exercises: Machines for Strengthening   Cybex Knee Extension 3x10 #15  Eccentrics: up with 2 down with LT only   Cybex Knee Flexion 3x10 #15  Eccentrics     Knee/Hip Exercises: Standing   Hip Flexion Stengthening;Both;2 sets;10 reps   Lateral Step Up --   Forward Step Up Left;2 sets;10 reps;Hand Hold: 1;Step Height: 6"   Rocker Board 3 minutes     Vasopneumatic   Number Minutes Vasopneumatic  15 minutes   Vasopnuematic Location  Knee   Vasopneumatic Pressure High   Vasopneumatic Temperature  3 snowflakes                  PT  Short Term Goals - 10/18/15 1158      PT SHORT TERM GOAL #2   Title report < or = to 4/10 Lt knee pain with standing activity   Time 4   Period Weeks   Status Achieved  1-3/10     PT SHORT TERM GOAL #3   Title stand for 15 minutes for home tasks without need to rest   Time 4   Period Weeks   Status Achieved  In regards to the knee it does not limit her standing, it is her back  that limits.            PT Long Term Goals - 10/21/15 1228      PT LONG TERM GOAL #1   Title be independent in advanced HEP   Time 8   Period Weeks   Status On-going     PT LONG TERM GOAL #2   Title reduce FOTO to < or = to 53% limitation   Status Achieved     PT LONG TERM GOAL #3   Title demonstrate Lt knee AROM flexion to > or = to 105 degrees to improve steps and squatting   Time 8   Period Weeks   Status On-going     PT LONG TERM GOAL #4   Title improve strength to ascend steps with step-over-step > or = to 75% of the time   Time 8   Period Weeks   Status On-going               Plan - 10/21/15 1232    Clinical Impression Statement Pt had a fall last week and this has limited progress this week.  Pt continues to ascend steps with step to step gait due to Rt hip pain and safety concerns.  Pt continues to tolerate exercise in the clinic for strenghening.  Pt will continue to benefit from skilled PT for LE strength, endurance, flexiblity and pain and edema management.     Rehab Potential Good   PT Frequency 2x / week   PT Duration 8 weeks   PT Treatment/Interventions ADLs/Self Care Home Management;Cryotherapy;Electrical Stimulation;Functional mobility training;Stair training;Gait training;Ultrasound;Therapeutic activities;Therapeutic exercise;Balance training;Neuromuscular re-education;Patient/family education;Manual techniques;Passive range of motion;Vasopneumatic Device;Taping   PT Next Visit Plan Standing strength, edema management, Lt knee flexibilty.  Rt hip strength,  flexibility and endurance.     Consulted and Agree with Plan of Care Patient      Patient will benefit from skilled therapeutic intervention in order to improve the following deficits and impairments:  Abnormal gait, Pain, Increased edema, Decreased strength, Decreased mobility, Decreased scar mobility, Impaired flexibility, Decreased activity tolerance, Decreased endurance, Decreased range of motion, Difficulty walking  Visit Diagnosis: Acute pain of left knee  Localized edema  Other abnormalities of gait and mobility  Muscle weakness (generalized)  Pain in right hip     Problem List Patient Active Problem List   Diagnosis Date Noted  . Osteoarthritis of left knee 08/10/2015  . Status post total left knee replacement 08/10/2015  . Osteoarthritis of left hip 11/02/2014  . Status post total replacement of left hip 11/02/2014  . Osteoarthritis of right hip 09/08/2014  . Status post total replacement of right hip 09/08/2014  . PMB (postmenopausal bleeding) 02/28/2013  . Morbid obesity ([place]) 02/28/2013  . Pure hypercholesterolemia 02/28/2013  . Essential hypertension, benign 02/28/2013     Sigurd Sos, PT 10/21/15 1:01 PM  [facility] [address] [place], [place], [address] Phone: [phone]   Fax:  [phone]  Name: MARGRETTA GOUCHER MRN: [id number] Date of Birth: 1950/10/22

## 2015-10-25 ENCOUNTER — Ambulatory Visit: Payer: Medicare Other | Admitting: Physical Therapy

## 2015-10-25 ENCOUNTER — Encounter: Payer: Self-pay | Admitting: Physical Therapy

## 2015-10-25 DIAGNOSIS — M25551 Pain in right hip: Secondary | ICD-10-CM

## 2015-10-25 DIAGNOSIS — M25562 Pain in left knee: Secondary | ICD-10-CM | POA: Diagnosis not present

## 2015-10-25 DIAGNOSIS — R2689 Other abnormalities of gait and mobility: Secondary | ICD-10-CM | POA: Diagnosis not present

## 2015-10-25 DIAGNOSIS — M6281 Muscle weakness (generalized): Secondary | ICD-10-CM | POA: Diagnosis not present

## 2015-10-25 DIAGNOSIS — R600 Localized edema: Secondary | ICD-10-CM | POA: Diagnosis not present

## 2015-10-25 NOTE — Therapy (Signed)
[facility] [address], [place], [place], [address] Phone: [phone]   Fax:  [phone]  Physical Therapy Treatment  Patient Details  Name: [name] MRN: [id number] Date of Birth: Jun 28, 1950 Referring Provider: Jean Rosenthal, MD  Encounter Date: 10/25/2015      PT End of Session - 10/25/15 1205    Visit Number 17   Number of Visits 19   Date for PT Re-Evaluation 11/24/15   Authorization Type KX modifier on all visits, G-codes at visit 43   PT Start Time 1146   PT Stop Time 1245   PT Time Calculation (min) 59 min   Activity Tolerance Patient limited by pain   Behavior During Therapy [facility] for tasks assessed/performed      Past Medical History:  Diagnosis Date  . Abnormal Pap smear of cervix 03/2009   Colpo Biopsy CIN I with HPV  . Arthritis   . Asthma   . Bilateral edema of lower extremity    Takes lasix if needed  . Bronchitis   . Disc disorder    bulging disc in thoracic area  . GERD (gastroesophageal reflux disease)    occ  . Hypercholesteremia   . Hypertension since 1990's  . Lumbar herniated disc   . Obesity   . Pneumonia    hx  . Scoliosis   . Spinal stenosis   . Teeth grinding     Past Surgical History:  Procedure Laterality Date  . APPENDECTOMY  1965  . BACK SURGERY  1993   Thoracic and Lumbar  . BREAST BIOPSY Right 1990   benign cyst  . CERVICAL [place] SURGERY  2007  . COLONOSCOPY W/ POLYPECTOMY    . [place], as child   bilateral inguinal -1 during pregnancy.  Umbilical repair as child  . KNEE ARTHROSCOPY Right 1990  . [place] SURGERY  2006  . SHOULDER ARTHROSCOPY Left 1994   arthritis, hips,fingers  . TONSILLECTOMY AND ADENOIDECTOMY    . TOTAL HIP ARTHROPLASTY Right 09/08/2014   Procedure: RIGHT TOTAL HIP ARTHROPLASTY ANTERIOR APPROACH;  Surgeon: Mcarthur Rossetti, MD;  Location: [place];  Service: Orthopedics;  Laterality: Right;  . TOTAL HIP  ARTHROPLASTY Left 11/02/2014   Procedure: LEFT TOTAL HIP ARTHROPLASTY ANTERIOR APPROACH;  Surgeon: Mcarthur Rossetti, MD;  Location: [place];  Service: Orthopedics;  Laterality: Left;  . TOTAL KNEE ARTHROPLASTY Right 2009  . TOTAL KNEE ARTHROPLASTY Left 08/10/2015  . TOTAL KNEE ARTHROPLASTY Left 08/10/2015   Procedure: LEFT TOTAL KNEE ARTHROPLASTY;  Surgeon: Mcarthur Rossetti, MD;  Location: [place];  Service: Orthopedics;  Laterality: Left;    There were no vitals filed for this visit.      Subjective Assessment - 10/25/15 1150    Subjective Lt knee sorenss today. Rt hip pain as well. "Feels like there's a load in my knee."    Patient is accompained by: Family member   Pertinent History Hip replacement surgery: 09/08/14, 11/02/14, Lt TKA 08/10/15.  Cortizone injection into Rt hip.   Limitations Standing;Walking   Currently in Pain? Yes   Pain Score 5    Pain Location Knee   Pain Orientation Left   Pain Descriptors / Indicators Sore   Pain Type Acute pain   Multiple Pain Sites No                         [facility] Adult PT Treatment/Exercise - 10/25/15 0001  Lumbar Exercises: Stretches   Active Hamstring Stretch 3 reps;20 seconds     Knee/Hip Exercises: Aerobic   Stationary Bike L1 x 10 min  Concurrent review of status     Knee/Hip Exercises: Machines for Strengthening   Cybex Knee Extension 3x10 #15  Eccentrics: up with 2 down with LT only   Cybex Knee Flexion 3x10 #15  Eccentrics     Knee/Hip Exercises: Standing   Forward Step Up Left;2 sets;10 reps;Hand Hold: 1;Step Height: 6"     Vasopneumatic   Number Minutes Vasopneumatic  15 minutes   Vasopnuematic Location  Knee   Vasopneumatic Pressure High   Vasopneumatic Temperature  3 snowflakes     Manual Therapy   Manual Therapy Myofascial release   Manual therapy comments To Rt hip flexor   Myofascial Release at proximal Rt quad insertion                  PT Short Term Goals - 10/25/15  1206      PT SHORT TERM GOAL #4   Title improve strength to ascend steps with step-over-step gait > or = to 50% of the time   Time 4   Period Weeks   Status Achieved     PT SHORT TERM GOAL #5   Title improve strength to wean from cane 50% of the time in the home and community.     Time 4   Period Weeks   Status Achieved           PT Long Term Goals - 10/25/15 1206      PT LONG TERM GOAL #1   Title be independent in advanced HEP   Time 8   Period Weeks   Status On-going     PT LONG TERM GOAL #2   Title reduce FOTO to < or = to 53% limitation   Time 8   Period Weeks   Status Achieved     PT LONG TERM GOAL #3   Title demonstrate Lt knee AROM flexion to > or = to 105 degrees to improve steps and squatting   Period Weeks   Status On-going     PT LONG TERM GOAL #4   Title improve strength to ascend steps with step-over-step > or = to 75% of the time   Time 8   Period Weeks   Status On-going     PT LONG TERM GOAL #5   Title wean from cane > or = to 75% of the time for home and community distances   Time 8   Period Weeks   Status Achieved     PT LONG TERM GOAL #6   Title reduce Lt knee pain to < or = to 2/10 with standing tasks   Time 8   Period Weeks   Status On-going     PT LONG TERM GOAL #7   Title report < or = to 3/10 Rt hip pain with standing and walking   Time 8   Period Weeks   Status On-going               Plan - 10/25/15 1249    Clinical Impression Statement Pt presents with Rt hip pain and Lt knee sorenss. Knee tightness decreased some with bicycle. Increased Lt knee pain when attempted single leg stance exercises. Able to tolerate all seated and supine exercises well. Pt will continue to benefit from skilled therapy for Bil LE strengthening and stability.    Rehab Potential  Good   PT Frequency 2x / week   PT Duration 8 weeks   PT Treatment/Interventions ADLs/Self Care Home Management;Cryotherapy;Electrical Stimulation;Functional mobility  training;Stair training;Gait training;Ultrasound;Therapeutic activities;Therapeutic exercise;Balance training;Neuromuscular re-education;Patient/family education;Manual techniques;Passive range of motion;Vasopneumatic Device;Taping   PT Next Visit Plan Standing strength, edema management, Lt knee flexibilty.  Rt hip strength, flexibility and endurance.     Consulted and Agree with Plan of Care Patient      Patient will benefit from skilled therapeutic intervention in order to improve the following deficits and impairments:  Abnormal gait, Pain, Increased edema, Decreased strength, Decreased mobility, Decreased scar mobility, Impaired flexibility, Decreased activity tolerance, Decreased endurance, Decreased range of motion, Difficulty walking  Visit Diagnosis: Acute pain of left knee  Localized edema  Other abnormalities of gait and mobility  Muscle weakness (generalized)  Pain in right hip     Problem List Patient Active Problem List   Diagnosis Date Noted  . Osteoarthritis of left knee 08/10/2015  . Status post total left knee replacement 08/10/2015  . Osteoarthritis of left hip 11/02/2014  . Status post total replacement of left hip 11/02/2014  . Osteoarthritis of right hip 09/08/2014  . Status post total replacement of right hip 09/08/2014  . PMB (postmenopausal bleeding) 02/28/2013  . Morbid obesity ([place]) 02/28/2013  . Pure hypercholesterolemia 02/28/2013  . Essential hypertension, benign 02/28/2013    Mikle Bosworth PTA 10/25/2015, 1:17 PM  [facility] [address], [place], [place], [address] Phone: [phone]   Fax:  [phone]  Name: WINSOME PROEFROCK MRN: [id number] Date of Birth: 14-Jun-1950

## 2015-10-28 ENCOUNTER — Ambulatory Visit: Payer: Medicare Other

## 2015-10-28 DIAGNOSIS — M25551 Pain in right hip: Secondary | ICD-10-CM | POA: Diagnosis not present

## 2015-10-28 DIAGNOSIS — M25562 Pain in left knee: Secondary | ICD-10-CM

## 2015-10-28 DIAGNOSIS — R600 Localized edema: Secondary | ICD-10-CM

## 2015-10-28 DIAGNOSIS — M6281 Muscle weakness (generalized): Secondary | ICD-10-CM | POA: Diagnosis not present

## 2015-10-28 DIAGNOSIS — R2689 Other abnormalities of gait and mobility: Secondary | ICD-10-CM | POA: Diagnosis not present

## 2015-10-28 NOTE — Therapy (Signed)
[facility] [address] [place], [place], [address] Phone: [phone]   Fax:  [phone]  Physical Therapy Treatment  Patient Details  Name: [name] MRN: [id number] Date of Birth: 07-02-1950 Referring Provider: Jean Rosenthal, MD  Encounter Date: 10/28/2015      PT End of Session - 10/28/15 1219    Visit Number 18   Authorization Type KX modifier on all visits, G-codes at visit 19   PT Start Time 1145   PT Stop Time 1235   PT Time Calculation (min) 50 min   Activity Tolerance Patient limited by pain   Behavior During Therapy [facility] for tasks assessed/performed      Past Medical History:  Diagnosis Date  . Abnormal Pap smear of cervix 03/2009   Colpo Biopsy CIN I with HPV  . Arthritis   . Asthma   . Bilateral edema of lower extremity    Takes lasix if needed  . Bronchitis   . Disc disorder    bulging disc in thoracic area  . GERD (gastroesophageal reflux disease)    occ  . Hypercholesteremia   . Hypertension since 1990's  . Lumbar herniated disc   . Obesity   . Pneumonia    hx  . Scoliosis   . Spinal stenosis   . Teeth grinding     Past Surgical History:  Procedure Laterality Date  . APPENDECTOMY  1965  . BACK SURGERY  1993   Thoracic and Lumbar  . BREAST BIOPSY Right 1990   benign cyst  . CERVICAL [place] SURGERY  2007  . COLONOSCOPY W/ POLYPECTOMY    . [place], as child   bilateral inguinal -1 during pregnancy.  Umbilical repair as child  . KNEE ARTHROSCOPY Right 1990  . [place] SURGERY  2006  . SHOULDER ARTHROSCOPY Left 1994   arthritis, hips,fingers  . TONSILLECTOMY AND ADENOIDECTOMY    . TOTAL HIP ARTHROPLASTY Right 09/08/2014   Procedure: RIGHT TOTAL HIP ARTHROPLASTY ANTERIOR APPROACH;  Surgeon: Mcarthur Rossetti, MD;  Location: [place];  Service: Orthopedics;  Laterality: Right;  . TOTAL HIP ARTHROPLASTY Left 11/02/2014   Procedure: LEFT TOTAL HIP  ARTHROPLASTY ANTERIOR APPROACH;  Surgeon: Mcarthur Rossetti, MD;  Location: [place];  Service: Orthopedics;  Laterality: Left;  . TOTAL KNEE ARTHROPLASTY Right 2009  . TOTAL KNEE ARTHROPLASTY Left 08/10/2015  . TOTAL KNEE ARTHROPLASTY Left 08/10/2015   Procedure: LEFT TOTAL KNEE ARTHROPLASTY;  Surgeon: Mcarthur Rossetti, MD;  Location: [place];  Service: Orthopedics;  Laterality: Left;    There were no vitals filed for this visit.      Subjective Assessment - 10/28/15 1150    Subjective Pt is ready for discharge to HEP.  "I know I am going to have to live with pain"   Pertinent History Hip replacement surgery: 09/08/14, 11/02/14, Lt TKA 08/10/15.  Cortizone injection into Rt hip.   Currently in Pain? Yes   Pain Score 2    Pain Location Knee   Pain Orientation Left   Pain Descriptors / Indicators Sore   Pain Type Surgical pain   Pain Onset More than a month ago   Pain Frequency Intermittent   Pain Score 3   Pain Location Hip   Pain Orientation Right   Pain Descriptors / Indicators Aching   Pain Type Chronic pain   Pain Onset More than a month ago   Pain Frequency Intermittent   Aggravating Factors  standing,  activity   Pain Relieving Factors sitting down with ice, [place]            [facility] PT Assessment - 10/28/15 0001      Assessment   Medical Diagnosis s/p Lt TKA, Rt hip trochanteric bursitis   Next MD Visit 01/2016     Prior Function   Level of Independence Independent     Observation/Other Assessments   Focus on Therapeutic Outcomes (FOTO)  20% limitation  knee     AROM   Overall AROM  Deficits   AROM Assessment Site Knee   Right/Left Knee Left   Left Knee Extension 2   Left Knee Flexion 105     Strength   Overall Strength Deficits   Left Knee Flexion 4+/5   Left Knee Extension 4+/5                     [facility] Adult PT Treatment/Exercise - 10/28/15 0001      Lumbar Exercises: Stretches   Active Hamstring Stretch 3 reps;20 seconds      Knee/Hip Exercises: Aerobic   Stationary Bike L1 x 10 min  Concurrent review of status     Knee/Hip Exercises: Machines for Strengthening   Cybex Knee Extension 3x10 #15  Eccentrics: up with 2 down with LT only   Cybex Knee Flexion 3x10 #15  Eccentrics     Knee/Hip Exercises: Standing   Forward Step Up Left;2 sets;10 reps;Hand Hold: 1;Step Height: 6"   Rocker Board 3 minutes     Vasopneumatic   Number Minutes Vasopneumatic  15 minutes   Vasopnuematic Location  Knee   Vasopneumatic Pressure High   Vasopneumatic Temperature  3 snowflakes                  PT Short Term Goals - 10/25/15 1206      PT SHORT TERM GOAL #4   Title improve strength to ascend steps with step-over-step gait > or = to 50% of the time   Time 4   Period Weeks   Status Achieved     PT SHORT TERM GOAL #5   Title improve strength to wean from cane 50% of the time in the home and community.     Time 4   Period Weeks   Status Achieved           PT Long Term Goals - 10/28/15 1154      PT LONG TERM GOAL #1   Title be independent in advanced HEP   Status Achieved     PT LONG TERM GOAL #2   Title reduce FOTO to < or = to 53% limitation   Status Achieved     PT LONG TERM GOAL #3   Title demonstrate Lt knee AROM flexion to > or = to 105 degrees to improve steps and squatting   Status Achieved     PT LONG TERM GOAL #4   Title improve strength to ascend steps with step-over-step > or = to 75% of the time   Status Partially Met     PT LONG TERM GOAL #5   Title wean from cane > or = to 75% of the time for home and community distances   Status Achieved     PT LONG TERM GOAL #6   Title reduce Lt knee pain to < or = to 2/10 with standing tasks   Status Achieved     PT LONG TERM GOAL #7   Title report <  or = to 3/10 Rt hip pain with standing and walking   Status Partially Met               Plan - 11-03-2015 1158    Clinical Impression Statement Pt will discharge to HEP.  Pt  reports that her Rt hip pain is intermittent and often related to activity.  Lt knee is improved and most limitations are due to LBP and hip pain.  Pt has HEP in place for strength and flexiblity and will continue with HEP for strength and flexibility progression.   Rehab Potential Good   PT Frequency 2x / week   PT Duration 8 weeks   PT Treatment/Interventions ADLs/Self Care Home Management;Cryotherapy;Electrical Stimulation;Functional mobility training;Stair training;Gait training;Ultrasound;Therapeutic activities;Therapeutic exercise;Balance training;Neuromuscular re-education;Patient/family education;Manual techniques;Passive range of motion;Vasopneumatic Device;Taping   PT Next Visit Plan D/C PT to HEP   Consulted and Agree with Plan of Care Patient      Patient will benefit from skilled therapeutic intervention in order to improve the following deficits and impairments:  Abnormal gait, Pain, Increased edema, Decreased strength, Decreased mobility, Decreased scar mobility, Impaired flexibility, Decreased activity tolerance, Decreased endurance, Decreased range of motion, Difficulty walking  Visit Diagnosis: Acute pain of left knee  Localized edema  Other abnormalities of gait and mobility  Muscle weakness (generalized)  Pain in right hip       G-Codes - 11/03/15 1151    Functional Assessment Tool Used FOTO: 20%    Functional Limitation Mobility: Walking and moving around   Mobility: Walking and Moving Around Goal Status [phone]) At least 40 percent but less than 60 percent impaired, limited or restricted   Mobility: Walking and Moving Around Discharge Status [phone]) At least 20 percent but less than 40 percent impaired, limited or restricted      Problem List Patient Active Problem List   Diagnosis Date Noted  . Osteoarthritis of left knee 08/10/2015  . Status post total left knee replacement 08/10/2015  . Osteoarthritis of left hip 11/02/2014  . Status post total  replacement of left hip 11/02/2014  . Osteoarthritis of right hip 09/08/2014  . Status post total replacement of right hip 09/08/2014  . PMB (postmenopausal bleeding) 02/28/2013  . Morbid obesity ([place]) 02/28/2013  . Pure hypercholesterolemia 02/28/2013  . Essential hypertension, benign 02/28/2013   PHYSICAL THERAPY DISCHARGE SUMMARY  Visits from Start of Care: 18  Current functional level related to goals / functional outcomes: See above for current status   Remaining deficits: Pt with chronic low back and hip pain that limits function.  Lt knee is improved.  Pt will continue to HEP for continued gains.     Education / Equipment: HEP, posture/body mechanics Plan: Patient agrees to discharge.  Patient goals were partially met. Patient is being discharged due to being pleased with the current functional level.  ?????         Sigurd Sos, PT November 03, 2015 12:22 PM  [facility] [address] [place], [place], [address] Phone: [phone]   Fax:  [phone]  Name: [name] MRN: [id number] Date of Birth: 11/24/1950

## 2015-11-15 ENCOUNTER — Telehealth: Payer: Self-pay | Admitting: Nurse Practitioner

## 2015-11-15 NOTE — Telephone Encounter (Signed)
Spoke with patient. Patient states she has been doing Provera every 6 months. Patient states last Provera was started "around the end of October 2017" for 10 days. Last provera in April 2017. Patient states today she has some pink on toilet paper when wiping this morning. Patient states she was told to call for any vaginal bleeding. Patient denies any pain or urinary complaints. Recommended OV for further evaluation of PMP bleeding. Advised patient Kem Boroughs, NP out of the office, could schedule with covering provider. Patient scheduled with Melvia Heaps, CNM 11/16/15 at 12:45 pm. Patient verbalizes understanding and is agreeable.   Routing to provider for final review. Patient is agreeable to disposition. Will close encounter.    Cc: Melvia Heaps, CNM; Kem Boroughs, NP

## 2015-11-15 NOTE — Telephone Encounter (Signed)
Patient is having some spotting while using progestrone.

## 2015-11-16 ENCOUNTER — Encounter: Payer: Self-pay | Admitting: Certified Nurse Midwife

## 2015-11-16 ENCOUNTER — Ambulatory Visit (INDEPENDENT_AMBULATORY_CARE_PROVIDER_SITE_OTHER): Payer: Medicare Other | Admitting: Certified Nurse Midwife

## 2015-11-16 VITALS — BP 124/68 | HR 74 | Resp 16 | Ht 62.5 in | Wt 252.0 lb

## 2015-11-16 DIAGNOSIS — N950 Postmenopausal bleeding: Secondary | ICD-10-CM

## 2015-11-16 DIAGNOSIS — N898 Other specified noninflammatory disorders of vagina: Secondary | ICD-10-CM

## 2015-11-16 DIAGNOSIS — N760 Acute vaginitis: Secondary | ICD-10-CM | POA: Diagnosis not present

## 2015-11-16 NOTE — Progress Notes (Signed)
65 y.o. w Caucasian female G1P1001 here for evaluation of PMB, after completion of Provera she is using for prevention of Hyperplasia. She had previously taken Provera every 3 months, until 4/17 and then was told to go to every 6 months. Patient was seen for similar occurrence in 3/16 and was evaluated with SHGM, PUS, and endometrial biopsy. All normal. Patient describes very tiny amount of spotting, had to look to see if actually was on tissue paper and was very light, one occurrence. This occurred after completing 2 weeks after completing withdrawal bleeding from Provera use. No cramping or other issues. No bleeding since. No vaginal itching or discharge issues. Occasional vaginal dryness. No other health issues.   O: Healthy WD,WN female Affect: normal Skin:warm and dry Abdomen:soft, non tender Pelvic exam:EXTERNAL GENITALIA: normal appearing vulva with no masses, tenderness or lesions VAGINA: no abnormal discharge or lesions and no blood noted on visual or with Q-tip sweep. Affirm taken CERVIX: no lesions or cervical motion tenderness and no blood noted from cervical os UTERUS: normal, non tender, no masses ADNEXA: no masses palpable and nontender  A:PMB bleeding vs withdrawal bleeding with Provera residue Normal pelvic exam Morbid obesity R/O vaginal infection    P: Discussed findings of normal pelvic exam and no blood noted in vaginal vault or from cervix. Will discuss with MD and decide if PUS or endometrial biopsy needed again. Patient due to use Provera again in 6 months. Questions addressed. Will treat affirm if indicated. Patient will advise if any bleeding occurs again.  Labs  Affirm  RV prn, as above

## 2015-11-16 NOTE — Patient Instructions (Signed)
Postmenopausal Bleeding Postmenopausal bleeding is any bleeding after menopause. Menopause is when a woman's period stops. Any type of bleeding after menopause is concerning. It should be checked by your doctor. Any treatment will depend on the cause. Follow these instructions at home: Watch your condition for any changes.  Avoid the use of tampons and douches as told by your doctor.  Change your pads often.  Get regular pelvic exams and Pap tests.  Keep all appointments for tests as told by your doctor.  Contact a doctor if:  Your bleeding lasts for more than 1 week.  You have belly (abdominal) pain.  You have bleeding after sex (intercourse). Get help right away if:  You have a fever, chills, a headache, dizziness, muscle aches, and bleeding.  You have strong pain with bleeding.  You have clumps of blood (blood clots) coming from your vagina.  You have bleeding and need more than 1 pad an hour.  You feel like you are going to pass out (faint). This information is not intended to replace advice given to you by your health care provider. Make sure you discuss any questions you have with your health care provider. Document Released: 09/28/2007 Document Revised: 05/27/2015 Document Reviewed: 07/18/2012 Elsevier Interactive Patient Education  2017 Elsevier Inc.  

## 2015-11-17 ENCOUNTER — Telehealth: Payer: Self-pay | Admitting: Certified Nurse Midwife

## 2015-11-17 DIAGNOSIS — N950 Postmenopausal bleeding: Secondary | ICD-10-CM

## 2015-11-17 LAB — WET PREP BY MOLECULAR PROBE
Candida species: NEGATIVE
GARDNERELLA VAGINALIS: NEGATIVE
Trichomonas vaginosis: NEGATIVE

## 2015-11-17 NOTE — Telephone Encounter (Signed)
-----   Message from Regina Eck, CNM sent at 11/17/2015  1:26 PM EST ----- Notify patient that I spoke with Dr. Talbert Nan and she feels she should go forward with Endo Bx and [place]. Orders placed.

## 2015-11-17 NOTE — Telephone Encounter (Signed)
Spoke with patient. Advised of message as seen below from [place]. Patient is agreeable and verbalizes understanding. PUS and EMB scheduled for tomorrow at 4 pm with 4:15 pm consult with Dr.Jertson. Patient is agreeable to date and time. Orders changed for patient to see Dr.Jertson as these were placed by Melvia Heaps CNM. Pre procedure instructions given.  Motrin instructions given. Motrin=Advil=Ibuprofen, 800 mg one hour before appointment. Eat a meal and hydrate well before appointment. Patient is agreeable.  Cc: Melvia Heaps CNM [place] to provider for final review. Patient agreeable to disposition. Will close encounter.

## 2015-11-18 ENCOUNTER — Encounter: Payer: Self-pay | Admitting: Obstetrics and Gynecology

## 2015-11-18 ENCOUNTER — Ambulatory Visit (INDEPENDENT_AMBULATORY_CARE_PROVIDER_SITE_OTHER): Payer: Medicare Other | Admitting: Obstetrics and Gynecology

## 2015-11-18 ENCOUNTER — Ambulatory Visit (INDEPENDENT_AMBULATORY_CARE_PROVIDER_SITE_OTHER): Payer: Medicare Other

## 2015-11-18 ENCOUNTER — Telehealth: Payer: Self-pay

## 2015-11-18 DIAGNOSIS — N950 Postmenopausal bleeding: Secondary | ICD-10-CM

## 2015-11-18 NOTE — Progress Notes (Signed)
GYNECOLOGY  VISIT   HPI: 65 y.o.   Widowed  Caucasian  female   G1P1001 with Patient's last menstrual period was 01/14/2014 (exact date).   here for   Ultrasound/EMB She stopped having her cycles at 78, no bleeding for years. Then she was evaluated by Dr Joan Flores about 6 years ago and has been on cyclic provera since then. She was taking it every 3 months, this last year she was spaced out to 6 months. When she was taking it every 3 months, she would see brown after or nothing. She doesn't think she bleed in 2015, was taking the provera every 3 months. Last year in 3/16 she had a negative sonohysterogram and an endometrial biopsy that showed atrophic endometrium. After that biopsy she was spaced to 6 months and doesn't think she bleed 6 months ago. This last time she took the provera, she had light bleeding x 1 day, then 2 days later one other day of minimal spotting. She thinks she went over a year without bleeding prior to taking this provera.   GYNECOLOGIC HISTORY: Patient's last menstrual period was 01/14/2014 (exact date). Contraception:Postmenopausal Menopausal hormone therapy: Provera        OB History    Gravida Para Term Preterm AB Living   1 1 1  0 0 1   SAB TAB Ectopic Multiple Live Births   0 0 0 0 1         Patient Active Problem List   Diagnosis Date Noted  . Osteoarthritis of left knee 08/10/2015  . Status post total left knee replacement 08/10/2015  . Osteoarthritis of left hip 11/02/2014  . Status post total replacement of left hip 11/02/2014  . Osteoarthritis of right hip 09/08/2014  . Status post total replacement of right hip 09/08/2014  . PMB (postmenopausal bleeding) 02/28/2013  . Morbid obesity ([place]) 02/28/2013  . Pure hypercholesterolemia 02/28/2013  . Essential hypertension, benign 02/28/2013    Past Medical History:  Diagnosis Date  . Abnormal Pap smear of cervix 03/2009   Colpo Biopsy CIN I with HPV  . Arthritis   . Asthma   . Bilateral edema  of lower extremity    Takes lasix if needed  . Bronchitis   . Disc disorder    bulging disc in thoracic area  . GERD (gastroesophageal reflux disease)    occ  . Hypercholesteremia   . Hypertension since 1990's  . Lumbar herniated disc   . Obesity   . Pneumonia    hx  . Scoliosis   . Spinal stenosis   . Teeth grinding     Past Surgical History:  Procedure Laterality Date  . APPENDECTOMY  1965  . BACK SURGERY  1993   Thoracic and Lumbar  . BREAST BIOPSY Right 1990   benign cyst  . CERVICAL [place] SURGERY  2007  . COLONOSCOPY W/ POLYPECTOMY    . [place], as child   bilateral inguinal -1 during pregnancy.  Umbilical repair as child  . KNEE ARTHROSCOPY Right 1990  . [place] SURGERY  2006  . SHOULDER ARTHROSCOPY Left 1994   arthritis, hips,fingers  . TONSILLECTOMY AND ADENOIDECTOMY    . TOTAL HIP ARTHROPLASTY Right 09/08/2014   Procedure: RIGHT TOTAL HIP ARTHROPLASTY ANTERIOR APPROACH;  Surgeon: Mcarthur Rossetti, MD;  Location: [facility];  Service: Orthopedics;  Laterality: Right;  . TOTAL HIP ARTHROPLASTY Left 11/02/2014   Procedure: LEFT TOTAL HIP ARTHROPLASTY ANTERIOR APPROACH;  Surgeon: Lind Guest  Ninfa Linden, MD;  Location: [facility];  Service: Orthopedics;  Laterality: Left;  . TOTAL KNEE ARTHROPLASTY Right 2009  . TOTAL KNEE ARTHROPLASTY Left 08/10/2015  . TOTAL KNEE ARTHROPLASTY Left 08/10/2015   Procedure: LEFT TOTAL KNEE ARTHROPLASTY;  Surgeon: Mcarthur Rossetti, MD;  Location: [facility];  Service: Orthopedics;  Laterality: Left;    Current Outpatient Prescriptions  Medication Sig Dispense Refill  . albuterol (ACCUNEB) 0.63 MG/3ML nebulizer solution Take 1 ampule by nebulization every 6 (six) hours as needed for wheezing or shortness of breath.     Marland Kitchen albuterol (PROAIR HFA) 108 (90 Base) MCG/ACT inhaler Inhale 2 puffs into the lungs every 6 (six) hours as needed for wheezing or shortness of breath.    . budesonide (PULMICORT) 1 MG/2ML nebulizer solution  Take 1 mg by nebulization daily as needed (for wheezing or shortness of breath).     . cholecalciferol (VITAMIN D) 1000 UNITS tablet Take 1,000 Units by mouth 2 (two) times daily.    . Cinnamon 500 MG capsule Take 500 mg by mouth 2 (two) times daily.    . Coenzyme Q10 (COQ10) 200 MG CAPS Take 200 mg by mouth at bedtime.    . cyclobenzaprine (FLEXERIL) 10 MG tablet Take 1 tablet (10 mg total) by mouth 2 (two) times daily as needed for muscle spasms. 30 tablet 0  . diclofenac (VOLTAREN) 75 MG EC tablet Take 1 tablet (75 mg total) by mouth 2 (two) times daily as needed for mild pain. 60 tablet 1  . docusate sodium (COLACE) 100 MG capsule Take 200 mg by mouth at bedtime.    . felodipine (PLENDIL) 10 MG 24 hr tablet Take 10 mg by mouth daily.    Marland Kitchen FLAXSEED, LINSEED, PO Take 1,300 mg by mouth daily.     . furosemide (LASIX) 20 MG tablet Take 20-30 mg by mouth daily as needed (for leg swelling).     . Garlic 123XX123 MG CAPS Take 1,000 mg by mouth daily.    . Grape Seed 50 MG TABS Take 50 mg by mouth daily.    Marland Kitchen ipratropium-albuterol (DUONEB) 0.5-2.5 (3) MG/3ML SOLN Take 3 mLs by nebulization every 6 (six) hours as needed (shortness of breath).     . irbesartan (AVAPRO) 300 MG tablet Take 300 mg by mouth daily.    Marland Kitchen labetalol (NORMODYNE) 200 MG tablet Take 400 mg by mouth 2 (two) times daily.     Marland Kitchen lactobacillus acidophilus (BACID) TABS tablet Take 2 tablets by mouth every morning.    . Lecithin 1200 MG CAPS Take 1,200 mg by mouth daily.    Marland Kitchen lidocaine-hydrocortisone (LIDAZONE HC) 3-0.5 % CREA Place 1 Applicatorful rectally as needed (hemorrhoids).     . Lutein 20 MG TABS Take 20 mg by mouth daily.     . Multiple Vitamin (MULTIVITAMIN WITH MINERALS) TABS tablet Take 0.5 tablets by mouth 2 (two) times daily.     . Omega 3 1200 MG CAPS Take 1,200 mg by mouth 2 (two) times daily.    . simvastatin (ZOCOR) 20 MG tablet Take 20 mg by mouth at bedtime.     . vitamin C (ASCORBIC ACID) 500 MG tablet Take 500 mg by  mouth daily.     No current facility-administered medications for this visit.      ALLERGIES: Adhesive [tape] and Lortab [hydrocodone-acetaminophen]  Family History  Problem Relation Age of Onset  . Breast cancer Sister 28    bilateral mastectomy- Breast cancer   . Hypertension Mother   .  Heart disease Mother   . Stroke Mother   . Heart disease Father   . Depression Father   . Alcohol abuse Father   . Stroke Maternal Grandmother   . Emphysema Paternal Grandmother   . Breast cancer Maternal Aunt     Both aunts in 43 -54's  . Heart failure Paternal Grandfather   . Alcohol abuse Paternal Grandfather   . Depression Daughter     Social History   Social History  . Marital status: Widowed    Spouse name: N/A  . Number of children: N/A  . Years of education: N/A   Occupational History  . Not on file.   Social History Main Topics  . Smoking status: Never Smoker  . Smokeless tobacco: Never Used  . Alcohol use No  . Drug use: No  . Sexual activity: No   Other Topics Concern  . Not on file   Social History Narrative   Husband age 67 died April 06, 2013.    ROS  PHYSICAL EXAMINATION:    BP (!) 160/88 (BP Location: Right Arm, Patient Position: Sitting, Cuff Size: Large)   Pulse 80   Resp 16   Ht 5' 2.5" (1.588 m)   Wt 252 lb (114.3 kg)   LMP 01/14/2014 (Exact Date) Comment: spotting 11-15-15  BMI 45.36 kg/m     General appearance: alert, cooperative and appears stated age  Pelvic: External genitalia:  no lesions              Urethra:  normal appearing urethra with no masses, tenderness or lesions              Bartholins and Skenes: normal                 Vagina: normal appearing vagina with normal color and discharge, no lesions, vaginal polyp on the right vaginal side wall, not friable              Cervix: no lesions  The risks of endometrial biopsy were reviewed and a consent was obtained.  A speculum was placed in the vagina and the cervix was cleansed with  betadine.The mini-pipelle was placed into the endometrial cavity. The uterus sounded to 7 cm. The endometrial biopsy was performed, minimal tissue was obtained. The tenaculum and speculum were removed. There were no complications.   Chaperone was present for exam.  ASSESSMENT Postmenopausal bleeding, she has been on cyclic provera for years, most recently every 6 months. She went a year without bleeding, then bleed after provera recently. Very light bleeding a few days apart. She had an atrophic biopsy and negative sonohysterogram in 3/16.    PLAN U/S with thin endometrial stripe Endometrial biopsy done If this biopsy is negative, would consider stopping the provera (reviewed with Dr Sabra Heck who agreed with the plan) I've requested her old records to review   An After Visit Summary was printed and given to the patient.  15 minutes face to face time of which over 50% was spent in counseling.

## 2015-11-18 NOTE — Telephone Encounter (Signed)
Spoke with patient. Advised RN has spoken with Dr.Jertson and Amoxicillin does not need to be taken before PUS and EMB today. She will need to take 800 mg of Ibuprofen/Motrin 1 hour before with food. Patient is agreeable and verbalizes understanding.  Dr.Jertson, do you agree with recommendations for patient?

## 2015-11-18 NOTE — Patient Instructions (Signed)

## 2015-11-18 NOTE — Telephone Encounter (Addendum)
Spoke with patient at time of incoming call. Patient states that she usually has to take 2,000 mg of Amoxicillin 1 hours before procedures due to having multiple artifical joints. States she has the Amoxicillin on hand. Asking if she will need to take this one hour prior to her PUS and EMB today with Dr.Jertson. Advised I will speak with Dr.Jertson and return call with further recommendations. Patient is agreeable.

## 2015-11-19 NOTE — Progress Notes (Signed)
Encounter reviewed and discussed with Ms [name]. Recommendation for further evaluation.  [name], MD

## 2015-11-22 DIAGNOSIS — N950 Postmenopausal bleeding: Secondary | ICD-10-CM | POA: Diagnosis not present

## 2015-11-22 NOTE — Telephone Encounter (Signed)
Patient has some questions regarding bleeding after a procedure. She states it was heavy the day after but is a light pink color now. She wants to know if this is normal?

## 2015-11-22 NOTE — Addendum Note (Signed)
Addended by: Dorothy Spark on: 11/22/2015 03:02 PM   Modules accepted: Orders

## 2015-11-22 NOTE — Telephone Encounter (Signed)
Spoke with patient. Patient states that she had a EMB on 11/18/2015 and is having light pink spotting. Reports on 11/19/2015 she had light bleeding. Is now having intermittent light pink spotting. Denies any heavy bleeding, pelvic pain, fever, or foul smelling vaginal discharge. Advised it is normal to have light spotting following EMB. Advised this should slowly start to subside, but can last up to one week following EMB. Advised if she develops any heavy bleeding or any new symptoms will need to contact the office. Patient is agreeable and verbalizes understanding.  Dr.Jertson, anything further for this patient?

## 2015-11-24 ENCOUNTER — Other Ambulatory Visit: Payer: Self-pay | Admitting: Obstetrics and Gynecology

## 2015-11-24 MED ORDER — MEDROXYPROGESTERONE ACETATE 5 MG PO TABS: ORAL_TABLET | ORAL | 1 refills | Status: DC

## 2015-12-03 NOTE — Telephone Encounter (Signed)
Dr.Jertson, okay to close encounter? 

## 2015-12-07 NOTE — Telephone Encounter (Signed)
Will close encounter

## 2016-01-17 ENCOUNTER — Ambulatory Visit (INDEPENDENT_AMBULATORY_CARE_PROVIDER_SITE_OTHER): Payer: Medicare Other | Admitting: Orthopaedic Surgery

## 2016-01-17 DIAGNOSIS — Z96641 Presence of right artificial hip joint: Secondary | ICD-10-CM | POA: Diagnosis not present

## 2016-01-17 DIAGNOSIS — Z96652 Presence of left artificial knee joint: Secondary | ICD-10-CM | POA: Diagnosis not present

## 2016-01-17 DIAGNOSIS — Z96642 Presence of left artificial hip joint: Secondary | ICD-10-CM

## 2016-01-17 NOTE — Progress Notes (Signed)
The patient is now 5 months status post a left total knee arthroplasty, 14 months status post a left total hip arthroplasty, and 16 months as was her right total hip arthroplasty. She says all of these joints are doing well. She has remote history of a right total knee arthroplasty was done elsewhere. She says this only needed gives her problems is more of a grinding and soft tissue problem. She is somewhat is moderately obese individual. She's been very active and doing very well since she's had all her joints replaced. She says she still is a slight bruise on her left knee and she is doing with bursitis that does flareup from time to time on both her hips.  On exam she exam he is ambulating without an assistive device. Both hips have fluid range of motion. Her left hip has normal motion. The right hip has good motion. Neither has a blocked mobility and pain. She has a little bit of pain over the trochanteric area of both hips. Her left knee has excellent range of motion patella tracks well there is no grinding. The right knee has good range of motion but there is deathly crepitation at that IT band and the quad and the patellofemoral joint.  At this point she is still work on weight loss and quad strengthening exercises and work on stretching for bursitis. I'll see her back in 6 months at that visit I would like a low AP pelvis and then AP and lateral of both knees.

## 2016-02-28 DIAGNOSIS — H8110 Benign paroxysmal vertigo, unspecified ear: Secondary | ICD-10-CM | POA: Diagnosis not present

## 2016-03-10 ENCOUNTER — Ambulatory Visit: Payer: BC Managed Care – PPO | Admitting: Nurse Practitioner

## 2016-03-13 ENCOUNTER — Other Ambulatory Visit: Payer: Self-pay | Admitting: Family Medicine

## 2016-03-13 DIAGNOSIS — Z1231 Encounter for screening mammogram for malignant neoplasm of breast: Secondary | ICD-10-CM

## 2016-03-17 DIAGNOSIS — R7301 Impaired fasting glucose: Secondary | ICD-10-CM | POA: Diagnosis not present

## 2016-03-17 DIAGNOSIS — F3340 Major depressive disorder, recurrent, in remission, unspecified: Secondary | ICD-10-CM | POA: Diagnosis not present

## 2016-03-17 DIAGNOSIS — I10 Essential (primary) hypertension: Secondary | ICD-10-CM | POA: Diagnosis not present

## 2016-03-17 DIAGNOSIS — M5136 Other intervertebral disc degeneration, lumbar region: Secondary | ICD-10-CM | POA: Diagnosis not present

## 2016-03-17 DIAGNOSIS — Z23 Encounter for immunization: Secondary | ICD-10-CM | POA: Diagnosis not present

## 2016-03-17 DIAGNOSIS — J4530 Mild persistent asthma, uncomplicated: Secondary | ICD-10-CM | POA: Diagnosis not present

## 2016-03-17 DIAGNOSIS — Z0000 Encounter for general adult medical examination without abnormal findings: Secondary | ICD-10-CM | POA: Diagnosis not present

## 2016-03-17 DIAGNOSIS — E785 Hyperlipidemia, unspecified: Secondary | ICD-10-CM | POA: Diagnosis not present

## 2016-03-17 DIAGNOSIS — E559 Vitamin D deficiency, unspecified: Secondary | ICD-10-CM | POA: Diagnosis not present

## 2016-03-17 DIAGNOSIS — R600 Localized edema: Secondary | ICD-10-CM | POA: Diagnosis not present

## 2016-03-17 DIAGNOSIS — Z6841 Body Mass Index (BMI) 40.0 and over, adult: Secondary | ICD-10-CM | POA: Diagnosis not present

## 2016-03-20 ENCOUNTER — Encounter: Payer: Self-pay | Admitting: Nurse Practitioner

## 2016-03-20 ENCOUNTER — Ambulatory Visit (INDEPENDENT_AMBULATORY_CARE_PROVIDER_SITE_OTHER): Payer: Medicare Other | Admitting: Nurse Practitioner

## 2016-03-20 VITALS — BP 132/78 | HR 88 | Ht 62.5 in | Wt 255.0 lb

## 2016-03-20 DIAGNOSIS — Z124 Encounter for screening for malignant neoplasm of cervix: Secondary | ICD-10-CM | POA: Diagnosis not present

## 2016-03-20 DIAGNOSIS — Z01411 Encounter for gynecological examination (general) (routine) with abnormal findings: Secondary | ICD-10-CM

## 2016-03-20 DIAGNOSIS — N8500 Endometrial hyperplasia, unspecified: Secondary | ICD-10-CM

## 2016-03-20 DIAGNOSIS — Z0000 Encounter for general adult medical examination without abnormal findings: Secondary | ICD-10-CM

## 2016-03-20 DIAGNOSIS — M159 Polyosteoarthritis, unspecified: Secondary | ICD-10-CM

## 2016-03-20 MED ORDER — MEDROXYPROGESTERONE ACETATE 5 MG PO TABS: ORAL_TABLET | ORAL | 12 refills | Status: DC

## 2016-03-20 NOTE — Progress Notes (Signed)
66 y.o. G5P1001 Widowed  Caucasian Fe here for annual exam. Recent HGB AIC 5.6. No vaginal bleeding with Provera 5 mg for 5 days every 3 months. Left knee replacement 08/10/15.  Now with both hip and both knee replacements.  Patient's last menstrual period was 11/15/2015.          Sexually active: No.  The current method of family planning is post menopausal status.    Exercising: Yes.    walking Smoker:  no  Health Maintenance: Pap: 03/06/14, Negative with neg HR HPV  02/28/13, Negative with neg HR HPV MMG:05/06/15, 3D, Bi-Rads 1: Negative Colonoscopy: 08/12/13, polyp, repeat in 5 years (Dr. Michail Sermon) BMD:05/04/14 T Score, 1.6 Left 1/3 Radius / -1.2 Left Femur Neck, spine not tested due to prior surgery TDaP:PCP Shingles: PCP Pneumonia: had in late 50's due to asthmas and bronchitis; Prevnar-13, 03/17/16, will have booster in one year Hep C: done last yr at PCP Labs: done PCP   reports that she has never smoked. She has never used smokeless tobacco. She reports that she does not drink alcohol or use drugs.  Past Medical History:  Diagnosis Date  . Abnormal Pap smear of cervix 03/2009   Colpo Biopsy CIN I with HPV  . Arthritis   . Asthma   . Bilateral edema of lower extremity    Takes lasix if needed  . Bronchitis   . Disc disorder    bulging disc in thoracic area  . GERD (gastroesophageal reflux disease)    occ  . Hypercholesteremia   . Hypertension since 1990's  . Lumbar herniated disc   . Obesity   . Pneumonia    hx  . Scoliosis   . Spinal stenosis   . Teeth grinding     Past Surgical History:  Procedure Laterality Date  . APPENDECTOMY  1965  . BACK SURGERY  1993   Thoracic and Lumbar  . BREAST BIOPSY Right 1990   benign cyst  . CERVICAL [place] SURGERY  2007  . COLONOSCOPY W/ POLYPECTOMY    . [place], as child   bilateral inguinal -1 during pregnancy.  Umbilical repair as child  . KNEE ARTHROSCOPY Right 1990  . [place] SURGERY  2006  . SHOULDER  ARTHROSCOPY Left 1994   arthritis, hips,fingers  . TONSILLECTOMY AND ADENOIDECTOMY    . TOTAL HIP ARTHROPLASTY Right 09/08/2014   Procedure: RIGHT TOTAL HIP ARTHROPLASTY ANTERIOR APPROACH;  Surgeon: Mcarthur Rossetti, MD;  Location: [facility];  Service: Orthopedics;  Laterality: Right;  . TOTAL HIP ARTHROPLASTY Left 11/02/2014   Procedure: LEFT TOTAL HIP ARTHROPLASTY ANTERIOR APPROACH;  Surgeon: Mcarthur Rossetti, MD;  Location: [facility];  Service: Orthopedics;  Laterality: Left;  . TOTAL KNEE ARTHROPLASTY Right 2009  . TOTAL KNEE ARTHROPLASTY Left 08/10/2015   Procedure: LEFT TOTAL KNEE ARTHROPLASTY;  Surgeon: Mcarthur Rossetti, MD;  Location: [facility];  Service: Orthopedics;  Laterality: Left;    Current Outpatient Prescriptions  Medication Sig Dispense Refill  . albuterol (ACCUNEB) 0.63 MG/3ML nebulizer solution Take 1 ampule by nebulization every 6 (six) hours as needed for wheezing or shortness of breath.     Marland Kitchen albuterol (PROAIR HFA) 108 (90 Base) MCG/ACT inhaler Inhale 2 puffs into the lungs every 6 (six) hours as needed for wheezing or shortness of breath.    . budesonide (PULMICORT) 1 MG/2ML nebulizer solution Take 1 mg by nebulization daily as needed (for wheezing or shortness of breath).     . cholecalciferol (VITAMIN  D) 1000 UNITS tablet Take 1,000 Units by mouth 2 (two) times daily.    . Cinnamon 500 MG capsule Take 500 mg by mouth 2 (two) times daily.    . Coenzyme Q10 (COQ10) 200 MG CAPS Take 200 mg by mouth at bedtime.    . cyclobenzaprine (FLEXERIL) 10 MG tablet Take 1 tablet (10 mg total) by mouth 2 (two) times daily as needed for muscle spasms. 30 tablet 0  . diclofenac (VOLTAREN) 75 MG EC tablet Take 1 tablet (75 mg total) by mouth 2 (two) times daily as needed for mild pain. 60 tablet 1  . docusate sodium (COLACE) 100 MG capsule Take 200 mg by mouth at bedtime.    . felodipine (PLENDIL) 10 MG 24 hr tablet Take 10 mg by mouth daily.    Marland Kitchen FLAXSEED, LINSEED, PO Take 1,300 mg  by mouth daily.     . furosemide (LASIX) 20 MG tablet Take 20-30 mg by mouth daily as needed (for leg swelling).     . Garlic 6283 MG CAPS Take 1,000 mg by mouth daily.    . Grape Seed 50 MG TABS Take 50 mg by mouth daily.    Marland Kitchen ipratropium-albuterol (DUONEB) 0.5-2.5 (3) MG/3ML SOLN Take 3 mLs by nebulization every 6 (six) hours as needed (shortness of breath).     . irbesartan (AVAPRO) 300 MG tablet Take 300 mg by mouth daily.    Marland Kitchen labetalol (NORMODYNE) 200 MG tablet Take 400 mg by mouth 2 (two) times daily.     Marland Kitchen lactobacillus acidophilus (BACID) TABS tablet Take 2 tablets by mouth every morning.    . Lecithin 1200 MG CAPS Take 1,200 mg by mouth daily.    Marland Kitchen lidocaine-hydrocortisone (LIDAZONE HC) 3-0.5 % CREA Place 1 Applicatorful rectally as needed (hemorrhoids).     . Lutein 20 MG TABS Take 20 mg by mouth daily.     . medroxyPROGESTERone (PROVERA) 5 MG tablet 1 tablet po qd x 5 days every 3 months 10 tablet 1  . Multiple Vitamin (MULTIVITAMIN WITH MINERALS) TABS tablet Take 0.5 tablets by mouth 2 (two) times daily.     . Omega 3 1200 MG CAPS Take 1,200 mg by mouth 2 (two) times daily.    . simvastatin (ZOCOR) 20 MG tablet Take 20 mg by mouth at bedtime.     . vitamin C (ASCORBIC ACID) 500 MG tablet Take 500 mg by mouth daily.     No current facility-administered medications for this visit.     Family History  Problem Relation Age of Onset  . Breast cancer Sister 75    bilateral mastectomy- Breast cancer   . Hypertension Mother   . Heart disease Mother   . Stroke Mother   . Heart disease Father   . Depression Father   . Alcohol abuse Father   . Stroke Maternal Grandmother   . Emphysema Paternal Grandmother   . Breast cancer Maternal Aunt     Both aunts in 58 -34's  . Heart failure Paternal Grandfather   . Alcohol abuse Paternal Grandfather   . Depression Daughter     ROS:  Pertinent items are noted in HPI.  Otherwise, a comprehensive ROS was negative.  Exam:   BP 132/78 (BP  Location: Right Arm, Patient Position: Sitting, Cuff Size: Large)   Pulse 88   Ht 5' 2.5" (1.588 m)   Wt 255 lb (115.7 kg)   LMP 11/15/2015 Comment: spotting 11-15-15  BMI 45.90 kg/m  Height: 5' 2.5" (  158.8 cm) Ht Readings from Last 3 Encounters:  03/20/16 5' 2.5" (1.588 m)  11/18/15 5' 2.5" (1.588 m)  11/16/15 5' 2.5" (1.588 m)    General appearance: alert, cooperative and appears stated age Head: Normocephalic, without obvious abnormality, atraumatic Neck: no adenopathy, supple, symmetrical, trachea midline and thyroid normal to inspection and palpation Lungs: clear to auscultation bilaterally Breasts: normal appearance, no masses or tenderness Heart: regular rate and rhythm Abdomen: soft, non-tender; no masses,  no organomegaly Extremities: extremities normal, atraumatic, no cyanosis or edema Skin: Skin color, texture, turgor normal. No rashes or lesions Lymph nodes: Cervical, supraclavicular, and axillary nodes normal. No abnormal inguinal nodes palpated Neurologic: Grossly normal   Pelvic: External genitalia:  no lesions              Urethra:  normal appearing urethra with no masses, tenderness or lesions              Bartholin's and Skene's: normal                 Vagina: normal appearing vagina with normal color and discharge, no lesions              Cervix: anteverted              Pap taken: No. Bimanual Exam:  Uterus:  normal size, contour, position, consistency, mobility, non-tender              Adnexa: no mass, fullness, tenderness               Rectovaginal: Confirms               Anus:  normal sphincter tone, no lesions  Chaperone present: yes  A:  Well Woman with normal exam  Postmenopausal At risk for endo hyperplasia and on Provera 5mg  for 5 days every 3 months at this time Previous endo biopsy 10/28/2007 fragmented Proliferative endo             Last endo biopsy 03/26/14                         Endometrium, biopsy, routine                          - ABUNDANT MUCUS AND SCANT ATROPHIC ENDOMETRIAL GLANDS.                         - NO ATYPIA OR MALIGNANCY. Morbid obesity             OA multiple joints, multiple back surgery             S/P bilateral hip replacements 2016             S/P right knee replacement 2009             History of asthma  S/P left knee replacement 08/10/2015               P:   Reviewed health and wellness pertinent to exam  Pap smear not done  Mammogram is due 05/2016  Refill on Provera 5 mg daily X 5 days every 3 months - if any bleeding to CB  Counseled on breast self exam, mammography screening, use and side effects of HRT, adequate intake of calcium and vitamin D, diet and exercise, Kegel's exercises return annually or prn  An After Visit Summary was printed and  given to the patient.

## 2016-03-20 NOTE — Patient Instructions (Signed)

## 2016-03-25 NOTE — Progress Notes (Signed)
Encounter reviewed by Dr. Aundria Rud. Last evaluation for postmenopausal bleeding was 11/18/15 with Dr. Talbert Nan.  Pelvic ultrasound showed uterus with EMS 2.72 mm.  EMB showed benign atrophic endometrium.  Plan at that time is to continue Provera every 3 months and call if she has heavy bleeding or bleeding outside of taking the Provera.

## 2016-04-14 DIAGNOSIS — H43391 Other vitreous opacities, right eye: Secondary | ICD-10-CM | POA: Diagnosis not present

## 2016-04-22 DIAGNOSIS — R05 Cough: Secondary | ICD-10-CM | POA: Diagnosis not present

## 2016-04-28 ENCOUNTER — Ambulatory Visit
Admission: RE | Admit: 2016-04-28 | Discharge: 2016-04-28 | Disposition: A | Discharge status: Home / Self Care | Payer: Medicare Other | Source: Ambulatory Visit | Attending: Family Medicine | Admitting: Family Medicine

## 2016-04-28 ENCOUNTER — Other Ambulatory Visit: Payer: Self-pay | Admitting: Family Medicine

## 2016-04-28 DIAGNOSIS — R059 Cough, unspecified: Secondary | ICD-10-CM

## 2016-04-28 DIAGNOSIS — R05 Cough: Secondary | ICD-10-CM | POA: Diagnosis not present

## 2016-05-08 ENCOUNTER — Ambulatory Visit
Admission: RE | Admit: 2016-05-08 | Discharge: 2016-05-08 | Disposition: A | Discharge status: Home / Self Care | Payer: Medicare Other | Source: Ambulatory Visit | Attending: Family Medicine | Admitting: Family Medicine

## 2016-05-08 DIAGNOSIS — Z1231 Encounter for screening mammogram for malignant neoplasm of breast: Secondary | ICD-10-CM | POA: Diagnosis not present

## 2016-05-16 DIAGNOSIS — R05 Cough: Secondary | ICD-10-CM | POA: Diagnosis not present

## 2016-05-18 DIAGNOSIS — B349 Viral infection, unspecified: Secondary | ICD-10-CM | POA: Diagnosis not present

## 2016-07-03 DIAGNOSIS — R05 Cough: Secondary | ICD-10-CM | POA: Diagnosis not present

## 2016-07-17 ENCOUNTER — Ambulatory Visit (INDEPENDENT_AMBULATORY_CARE_PROVIDER_SITE_OTHER): Payer: Medicare Other

## 2016-07-17 ENCOUNTER — Ambulatory Visit (INDEPENDENT_AMBULATORY_CARE_PROVIDER_SITE_OTHER): Payer: Medicare Other | Admitting: Orthopaedic Surgery

## 2016-07-17 ENCOUNTER — Encounter (INDEPENDENT_AMBULATORY_CARE_PROVIDER_SITE_OTHER): Payer: Self-pay | Admitting: Orthopaedic Surgery

## 2016-07-17 DIAGNOSIS — M25561 Pain in right knee: Secondary | ICD-10-CM

## 2016-07-17 DIAGNOSIS — Z96642 Presence of left artificial hip joint: Secondary | ICD-10-CM

## 2016-07-17 DIAGNOSIS — Z96652 Presence of left artificial knee joint: Secondary | ICD-10-CM

## 2016-07-17 DIAGNOSIS — G8929 Other chronic pain: Secondary | ICD-10-CM

## 2016-07-17 DIAGNOSIS — Z96641 Presence of right artificial hip joint: Secondary | ICD-10-CM

## 2016-07-17 NOTE — Progress Notes (Signed)
The patient is well-known to me. We have performed both right and left total hip arthroplasties as well as a left total knee arthroplasty. She says all 3 of those joints are doing exceptionally well. She still concerned about her right total knee arthritis was done elsewhere inserted just how her knee and feels. At this point she is not using assistive device. She's actually lost between 20 and 30 pounds of weight since all of her surgeries. She looks the best I've seen her look and she seems to be doing well overall. She is walking with no limp.  Examination of either hip shows some pain over trochanteric areas a fluid range of motion of the hips with no, getting features. Examination of both knees show range of motion of both knees. Neither knee feels unstable.  X-rays of both hips and both knees show well-seated implant with no, getting features.  At this point we'll see her back in 6 months to see how she doing overall but no x-rays are needed. No other potentially recommend for her right total knee is upsizing her polyethylene liner if it becomes unstable to her that right now does not seem to be the case.

## 2016-08-02 ENCOUNTER — Telehealth: Payer: Self-pay | Admitting: Obstetrics and Gynecology

## 2016-08-02 NOTE — Telephone Encounter (Signed)
Left patient a message to call back to reschedule a future appointment that was cancelled by the provider. °

## 2016-09-23 DIAGNOSIS — F331 Major depressive disorder, recurrent, moderate: Secondary | ICD-10-CM | POA: Diagnosis not present

## 2016-10-09 DIAGNOSIS — Z23 Encounter for immunization: Secondary | ICD-10-CM | POA: Diagnosis not present

## 2017-01-17 ENCOUNTER — Encounter (INDEPENDENT_AMBULATORY_CARE_PROVIDER_SITE_OTHER): Payer: Self-pay | Admitting: Orthopaedic Surgery

## 2017-01-17 ENCOUNTER — Ambulatory Visit (INDEPENDENT_AMBULATORY_CARE_PROVIDER_SITE_OTHER): Payer: Medicare Other | Admitting: Orthopaedic Surgery

## 2017-01-17 DIAGNOSIS — Z96642 Presence of left artificial hip joint: Secondary | ICD-10-CM | POA: Diagnosis not present

## 2017-01-17 DIAGNOSIS — Z96641 Presence of right artificial hip joint: Secondary | ICD-10-CM

## 2017-01-17 DIAGNOSIS — Z96652 Presence of left artificial knee joint: Secondary | ICD-10-CM

## 2017-01-17 NOTE — Progress Notes (Signed)
The patient has a history of bilateral total hip arthroplasties by me as well as a left total knee arthroplasty.  She had a right total knee arthroplasty done elsewhere.  She says everything is doing well.  The right knee is only thing that bothers her but not enough to have anything else done at this point.  She says she is getting her life back together doing well overall.  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  She is very active.  I can easily put both hips and both knees through full range of motion and there is only a little bit of discomfort with her hips is minimal a little bit of discomfort with her right knee.  Her left knee moves fluidly and full.  Both knees feel grossly stable.  At this point all questions concerns were answered and addressed.  She will follow-up as needed.  She understands that any of the joints are bothering her in any way and it stays sustained pain she should give [place] a call and come to be seen.

## 2017-02-11 IMAGING — CR DG HIP (WITH OR WITHOUT PELVIS) 1V PORT*L*
2 series · 2 of 2 positions shown · non-contrast
Comparison: C-arm radiographs obtained earlier today.

CLINICAL DATA: Left hip replacement.

EXAM:
DG HIP (WITH OR WITHOUT PELVIS) 1V PORT LEFT

[AP]
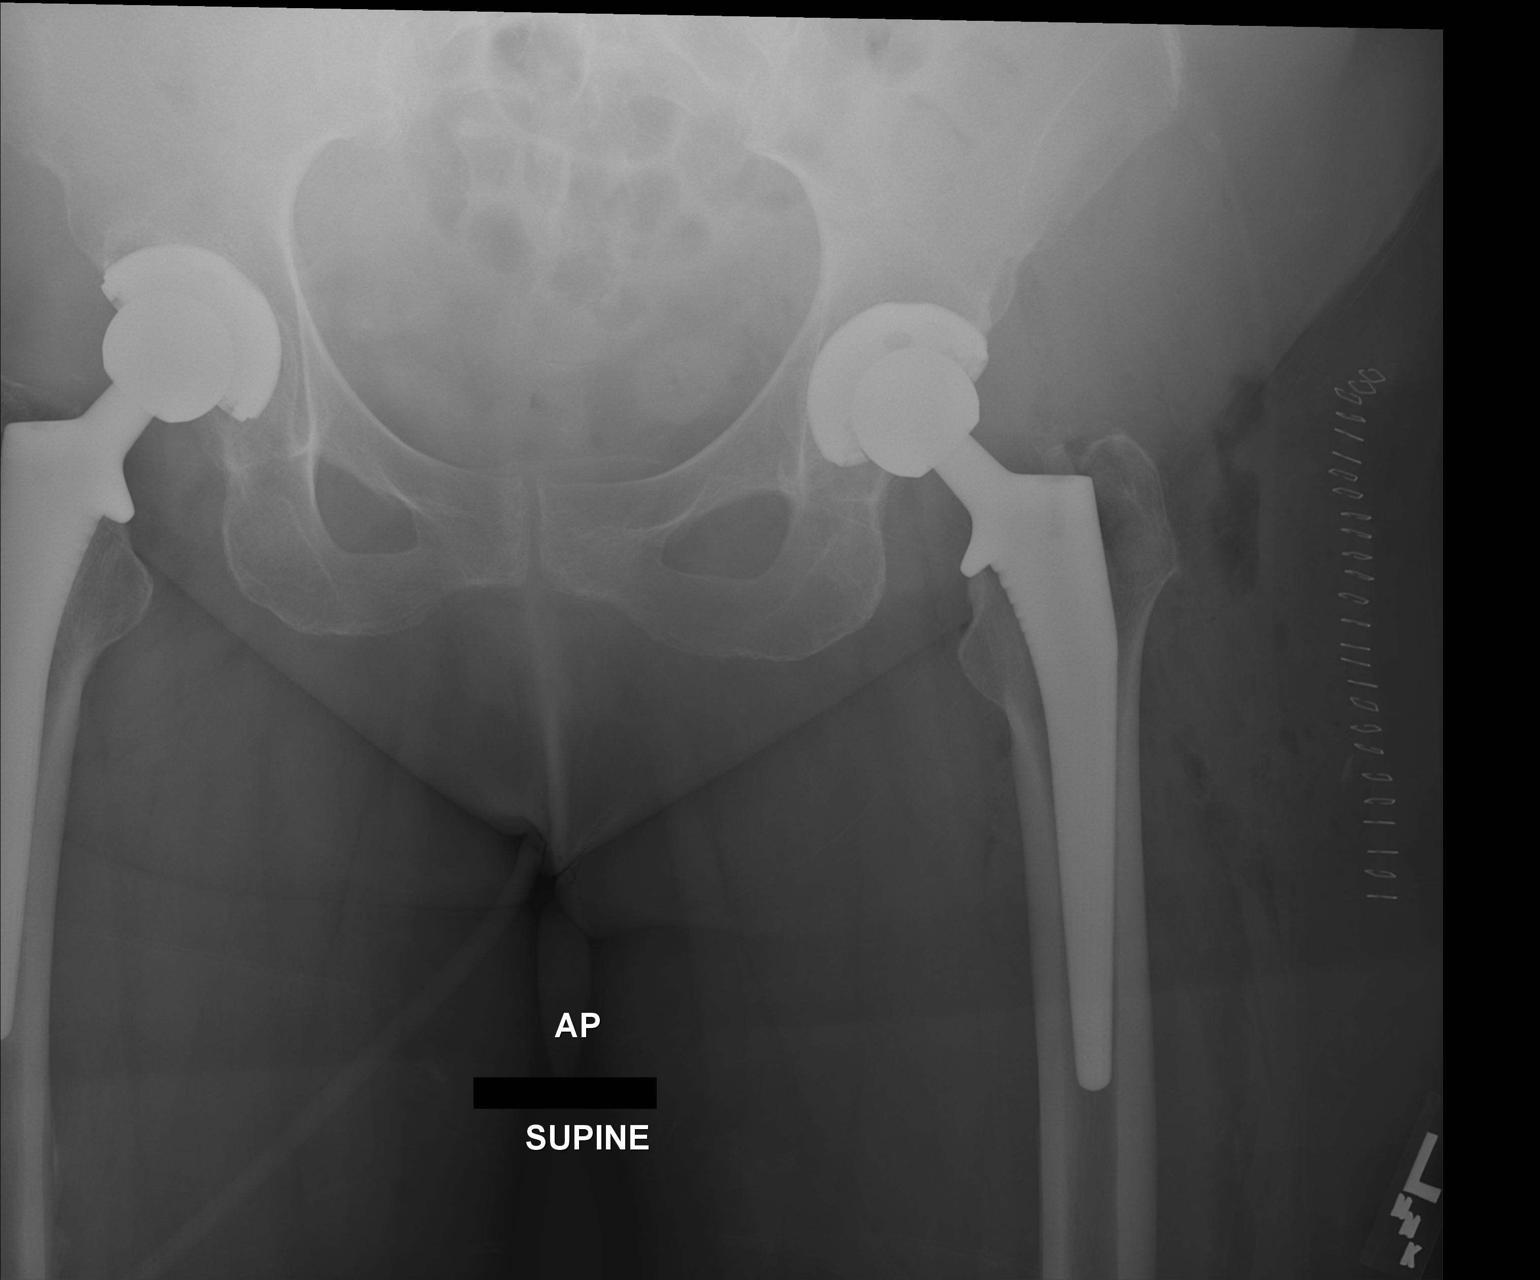

[xtable lateral]
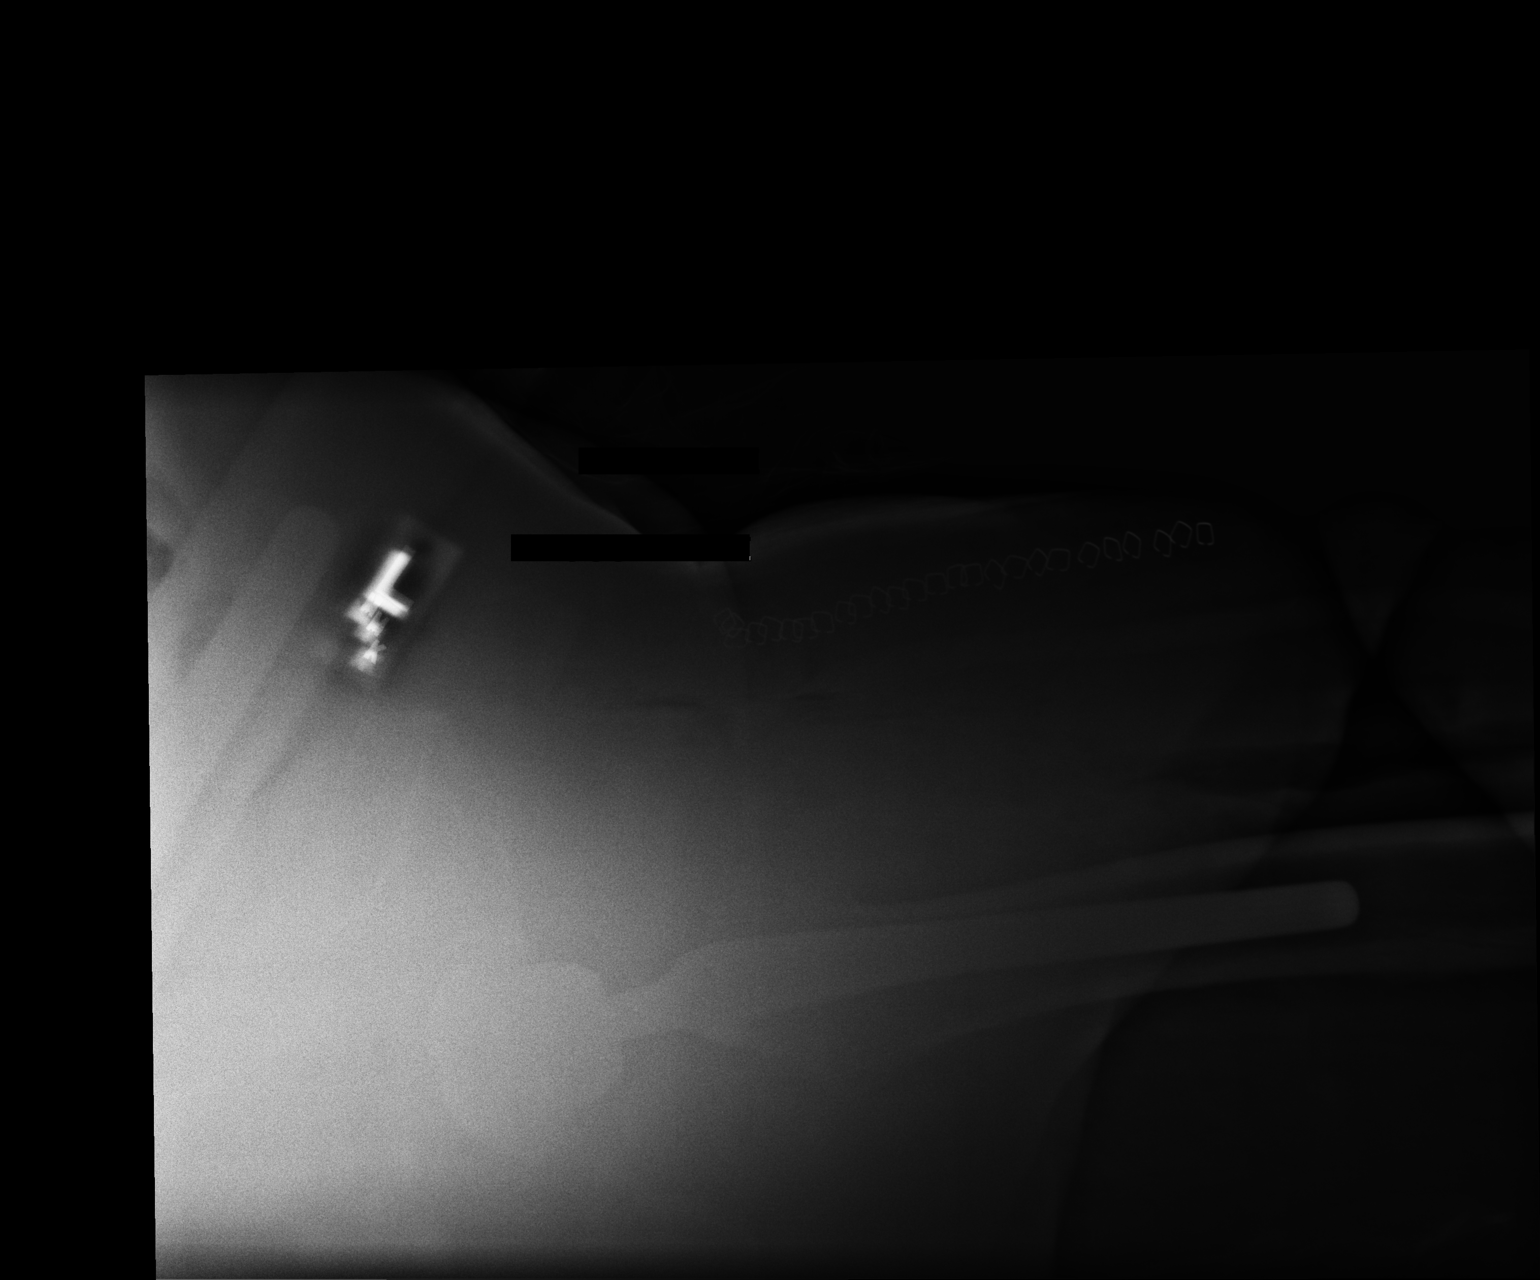

[2 of 2 positions shown; findings below may reference images not displayed]

FINDINGS: Bilateral hip prostheses. Skin clips and postoperative soft tissue
air on the left. The prosthetic components are in satisfactory
position and alignment. No fracture or dislocation seen.
IMPRESSION: Satisfactory postoperative appearance of a left hip prosthesis.

## 2017-02-28 ENCOUNTER — Other Ambulatory Visit: Payer: Self-pay | Admitting: Family Medicine

## 2017-02-28 DIAGNOSIS — Z1231 Encounter for screening mammogram for malignant neoplasm of breast: Secondary | ICD-10-CM

## 2017-03-19 DIAGNOSIS — R7301 Impaired fasting glucose: Secondary | ICD-10-CM | POA: Diagnosis not present

## 2017-03-19 DIAGNOSIS — Z0000 Encounter for general adult medical examination without abnormal findings: Secondary | ICD-10-CM | POA: Diagnosis not present

## 2017-03-19 DIAGNOSIS — Z23 Encounter for immunization: Secondary | ICD-10-CM | POA: Diagnosis not present

## 2017-03-19 DIAGNOSIS — M5136 Other intervertebral disc degeneration, lumbar region: Secondary | ICD-10-CM | POA: Diagnosis not present

## 2017-03-19 DIAGNOSIS — Z136 Encounter for screening for cardiovascular disorders: Secondary | ICD-10-CM | POA: Diagnosis not present

## 2017-03-19 DIAGNOSIS — I10 Essential (primary) hypertension: Secondary | ICD-10-CM | POA: Diagnosis not present

## 2017-03-19 DIAGNOSIS — E785 Hyperlipidemia, unspecified: Secondary | ICD-10-CM | POA: Diagnosis not present

## 2017-03-23 ENCOUNTER — Ambulatory Visit: Payer: Medicare Other | Admitting: Nurse Practitioner

## 2017-03-26 ENCOUNTER — Other Ambulatory Visit: Payer: Self-pay

## 2017-03-26 ENCOUNTER — Ambulatory Visit (INDEPENDENT_AMBULATORY_CARE_PROVIDER_SITE_OTHER): Payer: Medicare Other | Admitting: Obstetrics and Gynecology

## 2017-03-26 ENCOUNTER — Encounter: Payer: Self-pay | Admitting: Obstetrics and Gynecology

## 2017-03-26 VITALS — BP 118/70 | HR 80 | Resp 14 | Ht 62.25 in | Wt 252.0 lb

## 2017-03-26 DIAGNOSIS — Z124 Encounter for screening for malignant neoplasm of cervix: Secondary | ICD-10-CM | POA: Diagnosis not present

## 2017-03-26 DIAGNOSIS — Z01419 Encounter for gynecological examination (general) (routine) without abnormal findings: Secondary | ICD-10-CM | POA: Diagnosis not present

## 2017-03-26 NOTE — Patient Instructions (Signed)

## 2017-03-26 NOTE — Progress Notes (Signed)
67 y.o. G1P1001 WidowedCaucasianF here for annual exam.  She has been evaluated for PMP bleeding, thin stripe and atrophic endometrium. She has been taking cyclic provera for years, no bleeding in the last year using it every 3 months.  The vulvar tissue is thin. At times can get irritated from wiping.  She has been using replens vaginal moisturizer.  No vaginal bleeding. Just occasional GSI with a bad cold or with a very full bladder.   She is a [place] mother for the first time, having so much fun.  Husband died in 79 suddenly.   She has chronic back pain with slipped disc's in her back.     Patient's last menstrual period was 11/15/2015.          Sexually active: No.  The current method of family planning is post menopausal status.    Exercising: Yes.    walking Smoker:  no  Health Maintenance: Pap:  03-06-14 WNL NEG HR HPV 02-28-13 WNL NEG HR HPV  History of abnormal Pap:  One abnormal, told HPV, no surgery on her cervix.  MMG:  05-08-16 WNL  Colonoscopy:  08-12-13 polyp repeat in 5 yrs  BMD:   05-04-14 low bone mass, T score of -1.2 in her left hip. She has had 2 hip replacements since then. Normal in her forearm  TDaP:  Up t odate  Gardasil: N/A    reports that she has never smoked. She has never used smokeless tobacco. She reports that she drinks alcohol. She reports that she does not use drugs. Her 60 year old daughter had her first baby in 12/18, baby girl. Lives one block away.   Past Medical History:  Diagnosis Date  . Abnormal Pap smear of cervix 03/2009   Colpo Biopsy CIN I with HPV  . Arthritis   . Asthma   . Bilateral edema of lower extremity    Takes lasix if needed  . Bronchitis   . Disc disorder    bulging disc in thoracic area  . GERD (gastroesophageal reflux disease)    occ  . Hypercholesteremia   . Hypertension since 1990's  . Lumbar herniated disc   . Obesity   . Pneumonia    hx  . Scoliosis   . Spinal stenosis   . Teeth grinding     Past Surgical  History:  Procedure Laterality Date  . APPENDECTOMY  1965  . BACK SURGERY  1993   Thoracic and Lumbar  . BREAST BIOPSY Right 1990   benign cyst  . BREAST EXCISIONAL BIOPSY Right 1990   benign  . [place] SURGERY  2007  . COLONOSCOPY W/ POLYPECTOMY    . [place], as child   bilateral inguinal -1 during pregnancy.  Umbilical repair as child  . KNEE ARTHROSCOPY Right 1990  . [place] SURGERY  2006  . SHOULDER ARTHROSCOPY Left 1994   arthritis, hips,fingers  . TONSILLECTOMY AND ADENOIDECTOMY    . TOTAL HIP ARTHROPLASTY Right 09/08/2014   Procedure: RIGHT TOTAL HIP ARTHROPLASTY ANTERIOR APPROACH;  Surgeon: Mcarthur Rossetti, MD;  Location: [facility];  Service: Orthopedics;  Laterality: Right;  . TOTAL HIP ARTHROPLASTY Left 11/02/2014   Procedure: LEFT TOTAL HIP ARTHROPLASTY ANTERIOR APPROACH;  Surgeon: Mcarthur Rossetti, MD;  Location: [facility];  Service: Orthopedics;  Laterality: Left;  . TOTAL KNEE ARTHROPLASTY Right 2009  . TOTAL KNEE ARTHROPLASTY Left 08/10/2015   Procedure: LEFT TOTAL KNEE ARTHROPLASTY;  Surgeon: Mcarthur Rossetti, MD;  Location:  [facility];  Service: Orthopedics;  Laterality: Left;    Current Outpatient Medications  Medication Sig Dispense Refill  . albuterol (ACCUNEB) 0.63 MG/3ML nebulizer solution Take 1 ampule by nebulization every 6 (six) hours as needed for wheezing or shortness of breath.     Marland Kitchen albuterol (PROAIR HFA) 108 (90 Base) MCG/ACT inhaler Inhale 2 puffs into the lungs every 6 (six) hours as needed for wheezing or shortness of breath.    . cholecalciferol (VITAMIN D) 1000 UNITS tablet Take 1,000 Units by mouth 2 (two) times daily.    . Cinnamon 500 MG capsule Take 500 mg by mouth 2 (two) times daily.    . Coenzyme Q10 (COQ10) 200 MG CAPS Take 200 mg by mouth at bedtime.    . cyclobenzaprine (FLEXERIL) 10 MG tablet Take 1 tablet (10 mg total) by mouth 2 (two) times daily as needed for muscle spasms. 30 tablet 0  . diclofenac  (VOLTAREN) 75 MG EC tablet Take 1 tablet (75 mg total) by mouth 2 (two) times daily as needed for mild pain. 60 tablet 1  . docusate sodium (COLACE) 100 MG capsule Take 200 mg by mouth at bedtime.    . felodipine (PLENDIL) 10 MG 24 hr tablet Take 10 mg by mouth daily.    Marland Kitchen FLAXSEED, LINSEED, PO Take 1,300 mg by mouth daily.     . furosemide (LASIX) 20 MG tablet Take 20-30 mg by mouth daily as needed (for leg swelling).     . Garlic 0258 MG CAPS Take 1,000 mg by mouth daily.    . Grape Seed 50 MG TABS Take 50 mg by mouth daily.    Marland Kitchen ipratropium-albuterol (DUONEB) 0.5-2.5 (3) MG/3ML SOLN Take 3 mLs by nebulization every 6 (six) hours as needed (shortness of breath).     . irbesartan (AVAPRO) 300 MG tablet Take 300 mg by mouth daily.    Marland Kitchen labetalol (NORMODYNE) 200 MG tablet Take 400 mg by mouth 2 (two) times daily.     Marland Kitchen lactobacillus acidophilus (BACID) TABS tablet Take 2 tablets by mouth every morning.    . Lecithin 1200 MG CAPS Take 1,200 mg by mouth daily.    Marland Kitchen lidocaine-hydrocortisone (LIDAZONE HC) 3-0.5 % CREA Place 1 Applicatorful rectally as needed (hemorrhoids).     . Lutein 20 MG TABS Take 20 mg by mouth daily.     . medroxyPROGESTERone (PROVERA) 5 MG tablet 1 tablet po qd x 5 days every 3 months 10 tablet 12  . Multiple Vitamin (MULTIVITAMIN WITH MINERALS) TABS tablet Take 0.5 tablets by mouth 2 (two) times daily.     . Omega 3 1200 MG CAPS Take 1,200 mg by mouth 2 (two) times daily.    . simvastatin (ZOCOR) 20 MG tablet Take 20 mg by mouth at bedtime.     . vitamin C (ASCORBIC ACID) 500 MG tablet Take 500 mg by mouth daily.     No current facility-administered medications for this visit.     Family History  Problem Relation Age of Onset  . Breast cancer Sister 69       bilateral mastectomy- Breast cancer   . Hypertension Mother   . Heart disease Mother   . Stroke Mother   . Heart disease Father   . Depression Father   . Alcohol abuse Father   . Stroke Maternal Grandmother    . Emphysema Paternal Grandmother   . Breast cancer Maternal Aunt        Both aunts in 54 -  60's  . Heart failure Paternal Grandfather   . Alcohol abuse Paternal Grandfather   . Depression Daughter     Review of Systems  Constitutional: Negative.   HENT: Negative.   Eyes: Negative.   Respiratory: Negative.   Cardiovascular: Positive for leg swelling.  Gastrointestinal: Negative.   Endocrine: Negative.   Genitourinary: Negative.   Musculoskeletal: Negative.   Skin: Positive for rash.       Itchy rash on legs   Allergic/Immunologic: Negative.   Neurological: Negative.   Psychiatric/Behavioral: Negative.     Exam:   BP 118/70 (BP Location: Right Arm, Patient Position: Sitting, Cuff Size: Normal)   Pulse 80   Resp 14   Ht 5' 2.25" (1.581 m)   Wt 252 lb (114.3 kg)   LMP 11/15/2015 Comment: spotting 11-15-15  BMI 45.72 kg/m   Weight change: @WEIGHTCHANGE @ Height:   Height: 5' 2.25" (158.1 cm)  Ht Readings from Last 3 Encounters:  03/26/17 5' 2.25" (1.581 m)  03/20/16 5' 2.5" (1.588 m)  11/18/15 5' 2.5" (1.588 m)    General appearance: alert, cooperative and appears stated age Head: Normocephalic, without obvious abnormality, atraumatic Neck: no adenopathy, supple, symmetrical, trachea midline and thyroid normal to inspection and palpation Lungs: clear to auscultation bilaterally Cardiovascular: regular rate and rhythm Breasts: normal appearance, no masses or tenderness Abdomen: soft, non-tender; non distended,  no masses,  no organomegaly Extremities: extremities normal, atraumatic, no cyanosis or edema Skin: Skin color, texture, turgor normal. No rashes or lesions Lymph nodes: Cervical, supraclavicular, and axillary nodes normal. No abnormal inguinal nodes palpated Neurologic: Grossly normal   Pelvic: External genitalia:  no lesions              Urethra:  normal appearing urethra with no masses, tenderness or lesions              Bartholins and Skenes: normal                  Vagina: normal appearing vagina with normal color and discharge, no lesions              Cervix: no lesions               Bimanual Exam:  Uterus:  no masses, not tender              Adnexa: no mass, fullness, tenderness               Rectovaginal: Confirms               Anus:  normal sphincter tone, no lesions  Chaperone was present for exam.  A:  Well Woman with normal exam  P:   Pap with reflex hpv  Mammogram in 5/19  Colonoscopy next year  DEXA with mild osteopenia in the hip, s/p replacement  Discussed breast self exam  Discussed calcium and vit D intake  Told her to stop the cyclic provera  Discussed weight loss  Labs with primary

## 2017-03-28 ENCOUNTER — Other Ambulatory Visit (HOSPITAL_COMMUNITY)
Admission: RE | Admit: 2017-03-28 | Discharge: 2017-03-28 | Disposition: A | Discharge status: Home / Self Care | Payer: Medicare Other | Source: Ambulatory Visit | Attending: Obstetrics and Gynecology | Admitting: Obstetrics and Gynecology

## 2017-03-28 DIAGNOSIS — Z124 Encounter for screening for malignant neoplasm of cervix: Secondary | ICD-10-CM | POA: Insufficient documentation

## 2017-03-28 NOTE — Addendum Note (Signed)
Addended by: Dorothy Spark on: 03/28/2017 05:38 PM   Modules accepted: Orders

## 2017-04-02 LAB — CYTOLOGY - PAP: DIAGNOSIS: NEGATIVE

## 2017-04-04 DIAGNOSIS — H2513 Age-related nuclear cataract, bilateral: Secondary | ICD-10-CM | POA: Diagnosis not present

## 2017-04-12 ENCOUNTER — Telehealth: Payer: Self-pay | Admitting: Obstetrics and Gynecology

## 2017-04-12 NOTE — Telephone Encounter (Signed)
Patient stated that she had a procedure on March 25, and was wondering if there were results.

## 2017-04-12 NOTE — Telephone Encounter (Signed)
Spoke with patient, advised of pap results as seen below per Dr. Talbert Nan. Patient states she request call regarding labs in future, no MyChart result messages. Advised patient I will f/u with options for noting this for future labs. Patient thankful for return call. Will close encounter.    Notes recorded by Salvadore Dom, MD on 04/03/2017 at 5:49 PM EDT 02 recall Results and any recommendations were sent via [facility].   Hi [name], Your pap was normal. Have a great week! [name]

## 2017-05-09 ENCOUNTER — Ambulatory Visit
Admission: RE | Admit: 2017-05-09 | Discharge: 2017-05-09 | Disposition: A | Discharge status: Home / Self Care | Payer: Medicare Other | Source: Ambulatory Visit | Attending: Family Medicine | Admitting: Family Medicine

## 2017-05-09 DIAGNOSIS — Z1231 Encounter for screening mammogram for malignant neoplasm of breast: Secondary | ICD-10-CM | POA: Diagnosis not present

## 2017-05-10 ENCOUNTER — Other Ambulatory Visit: Payer: Self-pay | Admitting: Family Medicine

## 2017-05-10 DIAGNOSIS — R928 Other abnormal and inconclusive findings on diagnostic imaging of breast: Secondary | ICD-10-CM

## 2017-05-14 ENCOUNTER — Ambulatory Visit: Payer: Medicare Other

## 2017-05-14 ENCOUNTER — Ambulatory Visit
Admission: RE | Admit: 2017-05-14 | Discharge: 2017-05-14 | Disposition: A | Discharge status: Home / Self Care | Payer: Medicare Other | Source: Ambulatory Visit | Attending: Family Medicine | Admitting: Family Medicine

## 2017-05-14 ENCOUNTER — Other Ambulatory Visit: Payer: Self-pay | Admitting: Family Medicine

## 2017-05-14 DIAGNOSIS — R921 Mammographic calcification found on diagnostic imaging of breast: Secondary | ICD-10-CM | POA: Diagnosis not present

## 2017-05-14 DIAGNOSIS — R928 Other abnormal and inconclusive findings on diagnostic imaging of breast: Secondary | ICD-10-CM

## 2017-06-14 ENCOUNTER — Telehealth: Payer: Self-pay | Admitting: Obstetrics and Gynecology

## 2017-06-14 NOTE — Telephone Encounter (Signed)
Call returned to patient, OV scheduled for 6/18 at 1:30pm with Dr. Talbert Nan. Advised patient should new symptoms develop, return call to office. Patient verbalizes understanding.   Routing to provider for final review. Patient is agreeable to disposition. Will close encounter.

## 2017-06-14 NOTE — Telephone Encounter (Signed)
Patient called and stated that she believes she has a growth on her vagina. She stated that she has had polyps in the past, but is unsure what this is.

## 2017-06-14 NOTE — Telephone Encounter (Signed)
Spoke with patient. Reports soft, movable "growth" just inside vaginal canal. Noticed last night while placing Replens vaginally for dryness. Patient noticed small spot of "pink tinged"  blood after. Reports pelvic pain on 6/12 that resolved with po OTC anti inflammatory.   Denies pain or bleeding currently, urinary symptoms, vag d/c/odor.   Recommended OV for further evaluation. Patient declined first available OV for 6/25. Advised will review schedule with Dr. Talbert Nan and return call, patient agreeable.

## 2017-06-19 ENCOUNTER — Other Ambulatory Visit: Payer: Self-pay

## 2017-06-19 ENCOUNTER — Encounter: Payer: Self-pay | Admitting: Obstetrics and Gynecology

## 2017-06-19 ENCOUNTER — Ambulatory Visit (INDEPENDENT_AMBULATORY_CARE_PROVIDER_SITE_OTHER): Payer: Medicare Other | Admitting: Obstetrics and Gynecology

## 2017-06-19 VITALS — BP 170/90 | HR 76 | Resp 18 | Wt 254.0 lb

## 2017-06-19 DIAGNOSIS — N842 Polyp of vagina: Secondary | ICD-10-CM

## 2017-06-19 NOTE — Progress Notes (Signed)
GYNECOLOGY  VISIT   HPI: 67 y.o.   Widowed  Caucasian  female   [id number] with Patient's last menstrual period was 11/15/2015.   here c/o a vaginal "growth" that she noticed toward the back of her vagina. She was feeling vaginal dryness, used vaginal moisturized with her finger. She noticed something in the back of the vagina, it felt like a uvula up inside her vagina. No pain, no bleeding.      GYNECOLOGIC HISTORY: Patient's last menstrual period was 11/15/2015. Contraception:postmenopause  Menopausal hormone therapy: none         OB History    Gravida  1   Para  1   Term  1   Preterm  0   AB  0   Living  1     SAB  0   TAB  0   Ectopic  0   Multiple  0   Live Births  1              Patient Active Problem List   Diagnosis Date Noted  . Osteoarthritis of left knee 08/10/2015  . Status post total left knee replacement 08/10/2015  . Osteoarthritis of left hip 11/02/2014  . Status post total replacement of left hip 11/02/2014  . Osteoarthritis of right hip 09/08/2014  . Status post total replacement of right hip 09/08/2014  . PMB (postmenopausal bleeding) 02/28/2013  . Morbid obesity ([place]) 02/28/2013  . Pure hypercholesterolemia 02/28/2013  . Essential hypertension, benign 02/28/2013    Past Medical History:  Diagnosis Date  . Abnormal Pap smear of cervix 03/2009   Colpo Biopsy CIN I with HPV  . Arthritis   . Asthma   . Bilateral edema of lower extremity    Takes lasix if needed  . Bronchitis   . Disc disorder    bulging disc in thoracic area  . GERD (gastroesophageal reflux disease)    occ  . Hypercholesteremia   . Hypertension since 1990's  . Lumbar herniated disc   . Obesity   . Pneumonia    hx  . Scoliosis   . Spinal stenosis   . Teeth grinding     Past Surgical History:  Procedure Laterality Date  . APPENDECTOMY  1965  . BACK SURGERY  1993   Thoracic and Lumbar  . BREAST BIOPSY Right 1990   benign cyst  . BREAST EXCISIONAL BIOPSY  Right 1990   benign  . [place] SURGERY  2007  . COLONOSCOPY W/ POLYPECTOMY    . [place], as child   bilateral inguinal -1 during pregnancy.  Umbilical repair as child  . KNEE ARTHROSCOPY Right 1990  . [place] SURGERY  2006  . SHOULDER ARTHROSCOPY Left 1994   arthritis, hips,fingers  . TONSILLECTOMY AND ADENOIDECTOMY    . TOTAL HIP ARTHROPLASTY Right 09/08/2014   Procedure: RIGHT TOTAL HIP ARTHROPLASTY ANTERIOR APPROACH;  Surgeon: Mcarthur Rossetti, MD;  Location: [facility];  Service: Orthopedics;  Laterality: Right;  . TOTAL HIP ARTHROPLASTY Left 11/02/2014   Procedure: LEFT TOTAL HIP ARTHROPLASTY ANTERIOR APPROACH;  Surgeon: Mcarthur Rossetti, MD;  Location: [facility];  Service: Orthopedics;  Laterality: Left;  . TOTAL KNEE ARTHROPLASTY Right 2009  . TOTAL KNEE ARTHROPLASTY Left 08/10/2015   Procedure: LEFT TOTAL KNEE ARTHROPLASTY;  Surgeon: Mcarthur Rossetti, MD;  Location: [facility];  Service: Orthopedics;  Laterality: Left;    Current Outpatient Medications  Medication Sig Dispense Refill  . albuterol (ACCUNEB) 0.63 MG/3ML nebulizer  solution Take 1 ampule by nebulization every 6 (six) hours as needed for wheezing or shortness of breath.     Marland Kitchen albuterol (PROAIR HFA) 108 (90 Base) MCG/ACT inhaler Inhale 2 puffs into the lungs every 6 (six) hours as needed for wheezing or shortness of breath.    . cholecalciferol (VITAMIN D) 1000 UNITS tablet Take 1,000 Units by mouth 2 (two) times daily.    . Cinnamon 500 MG capsule Take 500 mg by mouth 2 (two) times daily.    . Coenzyme Q10 (COQ10) 200 MG CAPS Take 200 mg by mouth at bedtime.    . cyclobenzaprine (FLEXERIL) 10 MG tablet Take 1 tablet (10 mg total) by mouth 2 (two) times daily as needed for muscle spasms. 30 tablet 0  . diclofenac (VOLTAREN) 75 MG EC tablet Take 1 tablet (75 mg total) by mouth 2 (two) times daily as needed for mild pain. 60 tablet 1  . docusate sodium (COLACE) 100 MG capsule Take 200 mg by mouth at  bedtime.    . felodipine (PLENDIL) 10 MG 24 hr tablet Take 10 mg by mouth daily.    Marland Kitchen FLAXSEED, LINSEED, PO Take 1,300 mg by mouth daily.     . furosemide (LASIX) 20 MG tablet Take 20-30 mg by mouth daily as needed (for leg swelling).     . Garlic 7672 MG CAPS Take 1,000 mg by mouth daily.    . Grape Seed 50 MG TABS Take 50 mg by mouth daily.    Marland Kitchen ipratropium-albuterol (DUONEB) 0.5-2.5 (3) MG/3ML SOLN Take 3 mLs by nebulization every 6 (six) hours as needed (shortness of breath).     . irbesartan (AVAPRO) 300 MG tablet Take 300 mg by mouth daily.    Marland Kitchen labetalol (NORMODYNE) 200 MG tablet Take 400 mg by mouth 2 (two) times daily.     Marland Kitchen lactobacillus acidophilus (BACID) TABS tablet Take 2 tablets by mouth every morning.    . Lecithin 1200 MG CAPS Take 1,200 mg by mouth daily.    Marland Kitchen lidocaine-hydrocortisone (LIDAZONE HC) 3-0.5 % CREA Place 1 Applicatorful rectally as needed (hemorrhoids).     . Lutein 20 MG TABS Take 20 mg by mouth daily.     . Multiple Vitamin (MULTIVITAMIN WITH MINERALS) TABS tablet Take 0.5 tablets by mouth 2 (two) times daily.     . Omega 3 1200 MG CAPS Take 1,200 mg by mouth 2 (two) times daily.    . simvastatin (ZOCOR) 20 MG tablet Take 20 mg by mouth at bedtime.     . vitamin C (ASCORBIC ACID) 500 MG tablet Take 500 mg by mouth daily.     No current facility-administered medications for this visit.      ALLERGIES: Adhesive [tape] and Lortab [hydrocodone-acetaminophen]  Family History  Problem Relation Age of Onset  . Breast cancer Sister 67       bilateral mastectomy- Breast cancer   . Hypertension Mother   . Heart disease Mother   . Stroke Mother   . Heart disease Father   . Depression Father   . Alcohol abuse Father   . Stroke Maternal Grandmother   . Emphysema Paternal Grandmother   . Breast cancer Maternal Aunt        Both aunts in 74 -39's  . Heart failure Paternal Grandfather   . Alcohol abuse Paternal Grandfather   . Depression Daughter     Social  History   Socioeconomic History  . Marital status: Widowed    Spouse  name: Not on file  . Number of children: Not on file  . Years of education: Not on file  . Highest education level: Not on file  Occupational History  . Not on file  Social Needs  . Financial resource strain: Not on file  . Food insecurity:    Worry: Not on file    Inability: Not on file  . Transportation needs:    Medical: Not on file    Non-medical: Not on file  Tobacco Use  . Smoking status: Never Smoker  . Smokeless tobacco: Never Used  Substance and Sexual Activity  . Alcohol use: Yes    Comment: occasionally   . Drug use: No  . Sexual activity: Not Currently    Partners: Male    Birth control/protection: None  Lifestyle  . Physical activity:    Days per week: Not on file    Minutes per session: Not on file  . Stress: Not on file  Relationships  . Social connections:    Talks on phone: Not on file    Gets together: Not on file    Attends religious service: Not on file    Active member of club or organization: Not on file    Attends meetings of clubs or organizations: Not on file    Relationship status: Not on file  . Intimate partner violence:    Fear of current or ex partner: Not on file    Emotionally abused: Not on file    Physically abused: Not on file    Forced sexual activity: Not on file  Other Topics Concern  . Not on file  Social History Narrative   Husband age 32 died 03-Apr-2013.    Review of Systems  Constitutional: Negative.   HENT: Negative.   Eyes: Negative.   Respiratory: Negative.   Cardiovascular: Negative.   Gastrointestinal: Negative.   Genitourinary:       Vaginal "growth"   Musculoskeletal: Negative.   Skin: Negative.   Neurological: Negative.   Endo/Heme/Allergies: Negative.   Psychiatric/Behavioral: Negative.     PHYSICAL EXAMINATION:    BP (!) 170/90 (BP Location: Right Arm, Patient Position: Sitting, Cuff Size: Normal)   Pulse 76   Resp 18   Wt  254 lb (115.2 kg)   LMP 11/15/2015 Comment: spotting 11-15-15  BMI 46.08 kg/m     General appearance: alert, cooperative and appears stated age   Pelvic: External genitalia:  no lesions              Urethra:  normal appearing urethra with no masses, tenderness or lesions              Bartholins and Skenes: normal                 Vagina: atrophic appearing vagina with normal color no discharge, there is a 1.5 cm vaginal polyp posteriorly a few cm inside the vagina              Cervix: no lesions              Bimanual Exam:  Uterus:  normal size, contour, position, consistency, mobility, non-tender              Adnexa: no mass, fullness, tenderness                Chaperone was present for exam.  ASSESSMENT Vaginal polyp, benign, not bothersome    PLAN No treatment needed Call with any concerns  An After Visit Summary was printed and given to the patient.

## 2017-10-05 DIAGNOSIS — Z23 Encounter for immunization: Secondary | ICD-10-CM | POA: Diagnosis not present

## 2017-11-01 DIAGNOSIS — K645 Perianal venous thrombosis: Secondary | ICD-10-CM | POA: Diagnosis not present

## 2017-11-16 ENCOUNTER — Ambulatory Visit
Admission: RE | Admit: 2017-11-16 | Discharge: 2017-11-16 | Disposition: A | Discharge status: Home / Self Care | Payer: Medicare Other | Source: Ambulatory Visit | Attending: Family Medicine | Admitting: Family Medicine

## 2017-11-16 ENCOUNTER — Other Ambulatory Visit: Payer: Self-pay | Admitting: Family Medicine

## 2017-11-16 DIAGNOSIS — R921 Mammographic calcification found on diagnostic imaging of breast: Secondary | ICD-10-CM

## 2017-11-19 IMAGING — CR DG KNEE 1-2V PORT*L*
2 series · 2 of 2 positions shown · non-contrast
Comparison: April 26, 2015

CLINICAL DATA: Status post knee replacement.

EXAM:
PORTABLE LEFT KNEE - 1-2 VIEW

[AP]
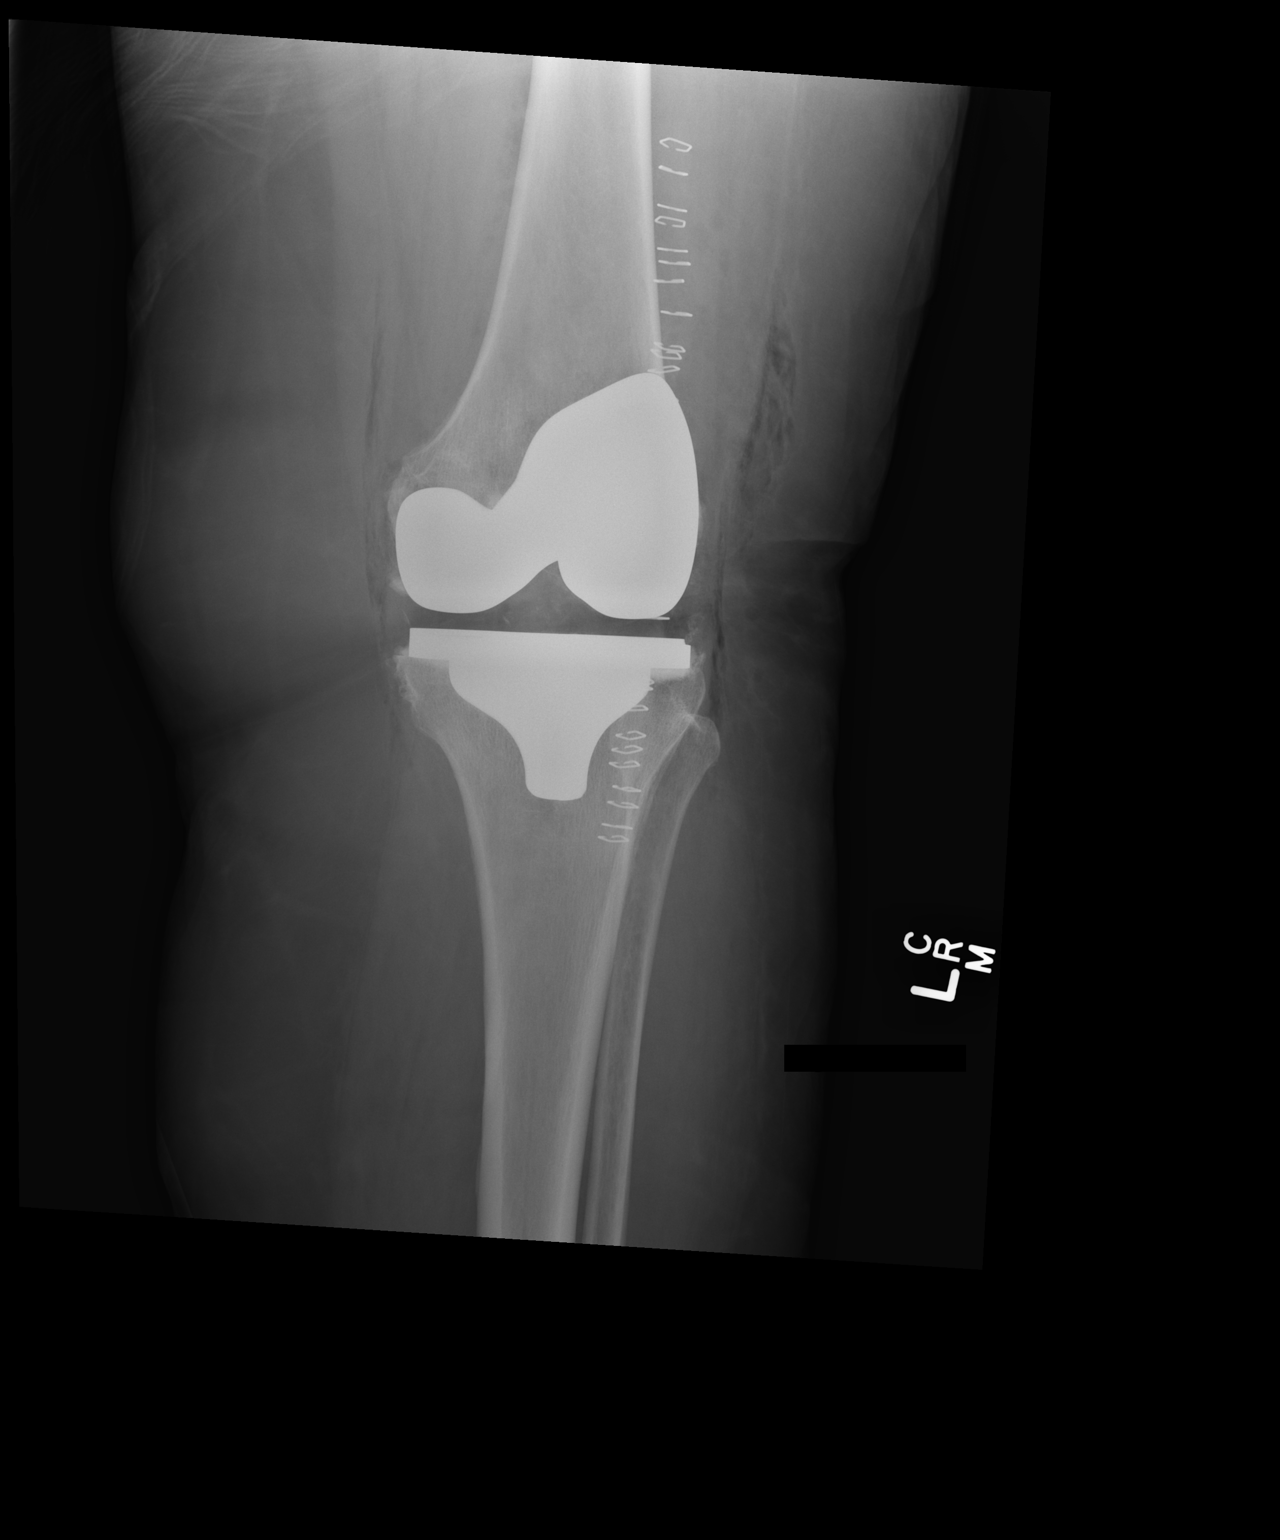

[xtable lateral]
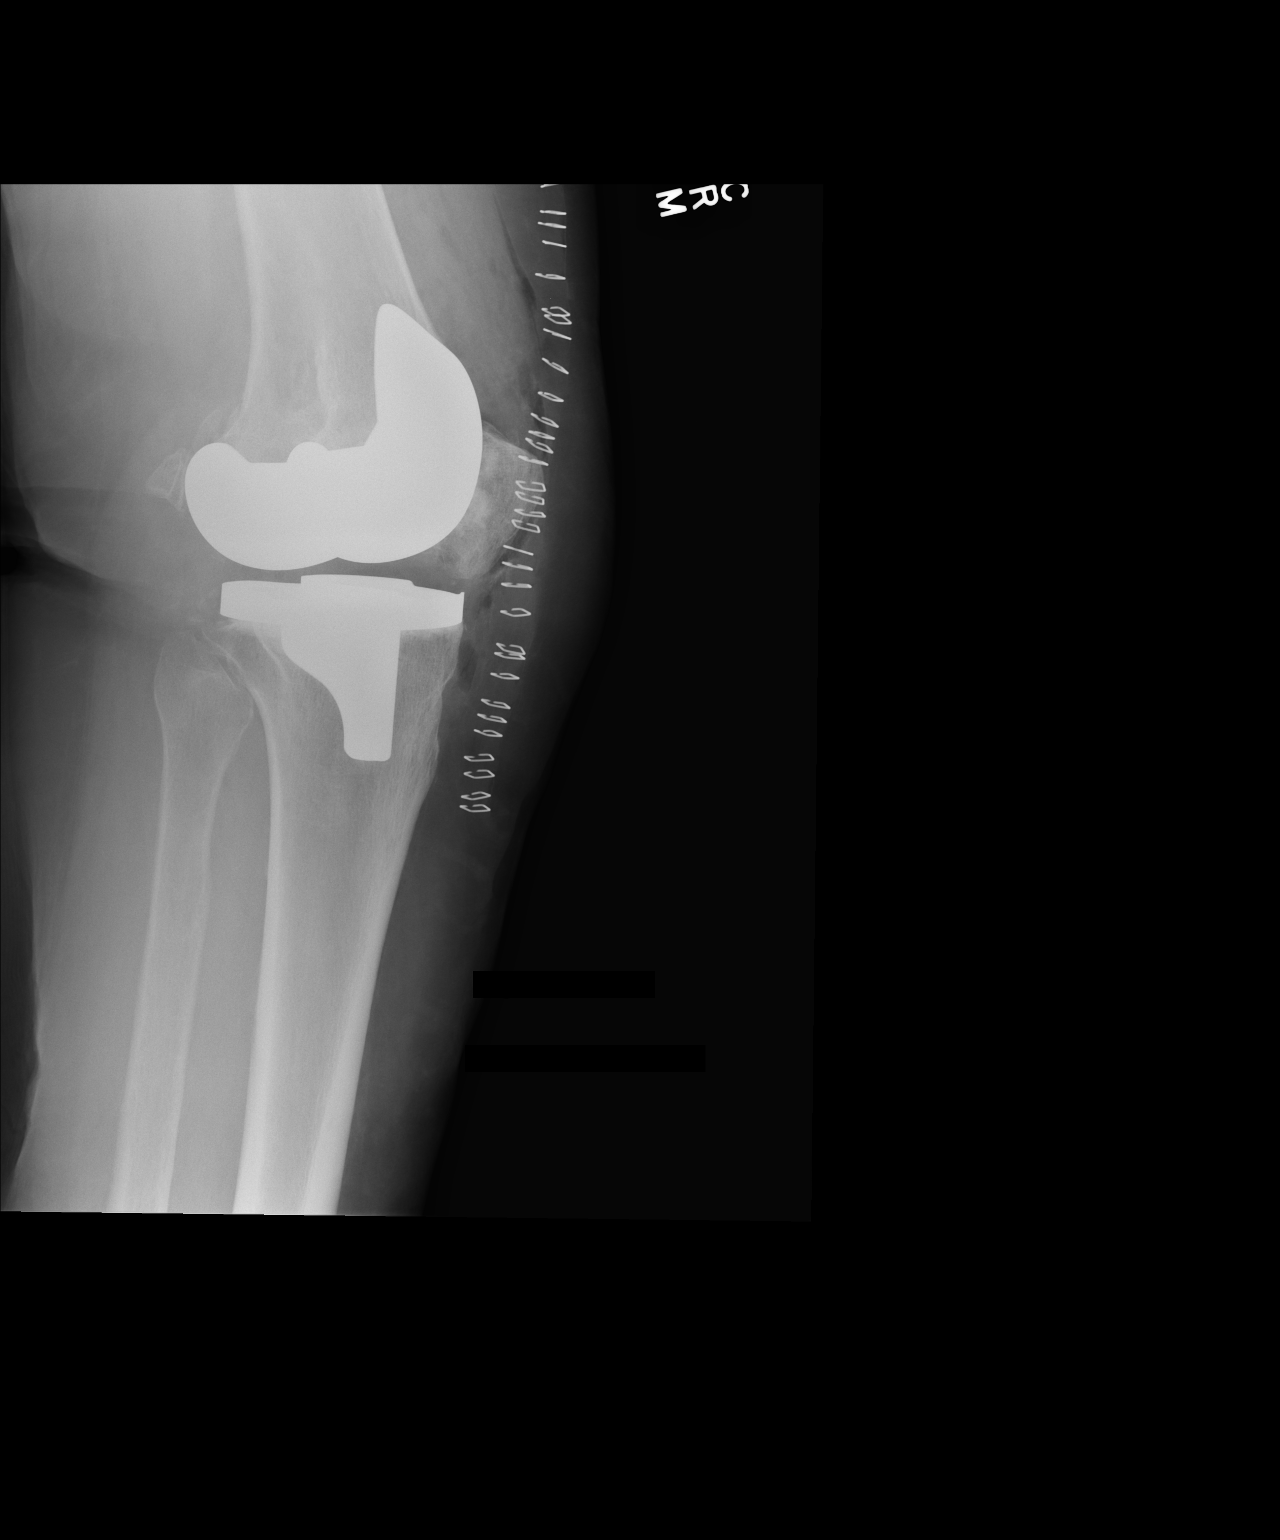

[2 of 2 positions shown; findings below may reference images not displayed]

FINDINGS: The patient is status post left knee replacement. Hardware is in
good position. Postoperative air and swelling identified. No acute
abnormalities.
IMPRESSION: No acute abnormalities after knee replacement.

## 2017-12-25 DIAGNOSIS — J441 Chronic obstructive pulmonary disease with (acute) exacerbation: Secondary | ICD-10-CM | POA: Diagnosis not present

## 2018-04-09 DIAGNOSIS — J45901 Unspecified asthma with (acute) exacerbation: Secondary | ICD-10-CM | POA: Diagnosis not present

## 2018-04-17 ENCOUNTER — Ambulatory Visit: Payer: Medicare Other | Admitting: Obstetrics and Gynecology

## 2018-06-05 DIAGNOSIS — E559 Vitamin D deficiency, unspecified: Secondary | ICD-10-CM | POA: Diagnosis not present

## 2018-06-05 DIAGNOSIS — I10 Essential (primary) hypertension: Secondary | ICD-10-CM | POA: Diagnosis not present

## 2018-06-05 DIAGNOSIS — R7301 Impaired fasting glucose: Secondary | ICD-10-CM | POA: Diagnosis not present

## 2018-06-05 DIAGNOSIS — I700 Atherosclerosis of aorta: Secondary | ICD-10-CM | POA: Diagnosis not present

## 2018-06-05 DIAGNOSIS — Z0000 Encounter for general adult medical examination without abnormal findings: Secondary | ICD-10-CM | POA: Diagnosis not present

## 2018-06-05 DIAGNOSIS — J4530 Mild persistent asthma, uncomplicated: Secondary | ICD-10-CM | POA: Diagnosis not present

## 2018-06-05 DIAGNOSIS — E785 Hyperlipidemia, unspecified: Secondary | ICD-10-CM | POA: Diagnosis not present

## 2018-06-05 DIAGNOSIS — Z6841 Body Mass Index (BMI) 40.0 and over, adult: Secondary | ICD-10-CM | POA: Diagnosis not present

## 2018-06-05 DIAGNOSIS — Z1211 Encounter for screening for malignant neoplasm of colon: Secondary | ICD-10-CM | POA: Diagnosis not present

## 2018-06-05 DIAGNOSIS — F3340 Major depressive disorder, recurrent, in remission, unspecified: Secondary | ICD-10-CM | POA: Diagnosis not present

## 2018-06-06 DIAGNOSIS — Z1211 Encounter for screening for malignant neoplasm of colon: Secondary | ICD-10-CM | POA: Diagnosis not present

## 2018-07-03 ENCOUNTER — Ambulatory Visit
Admission: RE | Admit: 2018-07-03 | Discharge: 2018-07-03 | Disposition: A | Discharge status: Home / Self Care | Payer: Medicare Other | Source: Ambulatory Visit | Attending: Family Medicine | Admitting: Family Medicine

## 2018-07-03 ENCOUNTER — Other Ambulatory Visit: Payer: Self-pay

## 2018-07-03 DIAGNOSIS — R921 Mammographic calcification found on diagnostic imaging of breast: Secondary | ICD-10-CM

## 2018-07-09 DIAGNOSIS — H2513 Age-related nuclear cataract, bilateral: Secondary | ICD-10-CM | POA: Diagnosis not present

## 2018-07-11 DIAGNOSIS — K625 Hemorrhage of anus and rectum: Secondary | ICD-10-CM | POA: Diagnosis not present

## 2018-07-11 DIAGNOSIS — Z86010 Personal history of colonic polyps: Secondary | ICD-10-CM | POA: Diagnosis not present

## 2018-08-09 DIAGNOSIS — K640 First degree hemorrhoids: Secondary | ICD-10-CM | POA: Diagnosis not present

## 2018-08-09 DIAGNOSIS — K625 Hemorrhage of anus and rectum: Secondary | ICD-10-CM | POA: Diagnosis not present

## 2018-08-09 DIAGNOSIS — Z86010 Personal history of colonic polyps: Secondary | ICD-10-CM | POA: Diagnosis not present

## 2018-10-29 DIAGNOSIS — Z23 Encounter for immunization: Secondary | ICD-10-CM | POA: Diagnosis not present

## 2018-11-11 ENCOUNTER — Telehealth: Payer: Self-pay | Admitting: Obstetrics and Gynecology

## 2018-11-11 NOTE — Telephone Encounter (Signed)
Patient has been having some "fullness and post menopausal bleeding". To triage to assess by phone. Patient did schedule for 11/14/18 at 2:30 pm with Dr.Jertson.

## 2018-11-11 NOTE — Telephone Encounter (Signed)
Spoke with patient. Patient was working in her yard on Saturday, reports increased heaviness in LLQ. Went to void later that day and felt that she was unable to stop. States she is sure it was not urine that was coming out, because she was unable to stop the flow. Reports increased vaginal moisture over the weekend. Felt around in the vaginal canal and noticed specs of blood on her finger. Denies urinary symptoms, flank pain, fever/chills, N.V. Reports normal BMs. Hx of vaginal polyp. [id number] prescreen neg, precautions reviewed.   OV rescheduled to 11/11 at 4:30pm with Dr. Talbert Nan.   Routing to provider for final review. Patient is agreeable to disposition. Will close encounter.

## 2018-11-13 ENCOUNTER — Encounter: Payer: Self-pay | Admitting: Obstetrics and Gynecology

## 2018-11-13 ENCOUNTER — Other Ambulatory Visit: Payer: Self-pay

## 2018-11-13 ENCOUNTER — Ambulatory Visit (INDEPENDENT_AMBULATORY_CARE_PROVIDER_SITE_OTHER): Payer: Medicare Other | Admitting: Obstetrics and Gynecology

## 2018-11-13 VITALS — BP 158/80 | HR 90 | Temp 97.1°F | Resp 14 | Ht 62.0 in | Wt 254.2 lb

## 2018-11-13 DIAGNOSIS — R1032 Left lower quadrant pain: Secondary | ICD-10-CM | POA: Diagnosis not present

## 2018-11-13 DIAGNOSIS — R102 Pelvic and perineal pain: Secondary | ICD-10-CM

## 2018-11-13 DIAGNOSIS — N950 Postmenopausal bleeding: Secondary | ICD-10-CM | POA: Diagnosis not present

## 2018-11-13 LAB — POCT URINALYSIS DIPSTICK
Bilirubin, UA: NEGATIVE
Blood, UA: NEGATIVE
Glucose, UA: NEGATIVE
Ketones, UA: NEGATIVE
Leukocytes, UA: NEGATIVE
Nitrite, UA: NEGATIVE
Protein, UA: NEGATIVE
Urobilinogen, UA: 0.2 E.U./dL
pH, UA: 7 (ref 5.0–8.0)

## 2018-11-13 NOTE — Patient Instructions (Signed)
Musculoskeletal Pain Musculoskeletal pain refers to aches and pains in your bones, joints, muscles, and the tissues that surround them. This pain can occur in any part of the body. It can last for a short time (acute) or a long time (chronic). A physical exam, lab tests, and imaging studies may be done to find the cause of your musculoskeletal pain. Follow these instructions at home:  Lifestyle  Try to control or lower your stress levels. Stress increases muscle tension and can worsen musculoskeletal pain. It is important to recognize when you are anxious or stressed and learn ways to manage it. This may include: ? Meditation or yoga. ? Cognitive or behavioral therapy. ? Acupuncture or massage therapy.  You may continue all activities unless the activities cause more pain. When the pain gets better, slowly resume your normal activities. Gradually increase the intensity and duration of your activities or exercise. Managing pain, stiffness, and swelling  Take over-the-counter and prescription medicines only as told by your health care provider.  When your pain is severe, bed rest may be helpful. Lie or sit in any position that is comfortable, but get out of bed and walk around at least every couple of hours.  If directed, apply heat to the affected area as often as told by your health care provider. Use the heat source that your health care provider recommends, such as a moist heat pack or a heating pad. ? Place a towel between your skin and the heat source. ? Leave the heat on for 20-30 minutes. ? Remove the heat if your skin turns bright red. This is especially important if you are unable to feel pain, heat, or cold. You may have a greater risk of getting burned.  If directed, put ice on the painful area. ? Put ice in a plastic bag. ? Place a towel between your skin and the bag. ? Leave the ice on for 20 minutes, 2-3 times a day. General instructions  Your health care provider may  recommend that you see a physical therapist. This person can help you come up with a safe exercise program. Do any exercises as told by your physical therapist.  Keep all follow-up visits, including any physical therapy visits, as told by your health care providers. This is important. Contact a health care provider if:  Your pain gets worse.  Medicines do not help ease your pain.  You cannot use the part of your body that hurts, such as your arm, leg, or neck.  You have trouble sleeping.  You have trouble doing your normal activities. Get help right away if:  You have a new injury and your pain is worse or different.  You feel numb or you have tingling in the painful area. Summary  Musculoskeletal pain refers to aches and pains in your bones, joints, muscles, and the tissues that surround them.  This pain can occur in any part of the body.  Your health care provider may recommend that you see a physical therapist. This person can help you come up with a safe exercise program. Do any exercises as told by your physical therapist.  Lower your stress level. Stress can worsen musculoskeletal pain. Ways to lower stress may include meditation, yoga, cognitive or behavioral therapy, acupuncture, and massage therapy. This information is not intended to replace advice given to you by your health care provider. Make sure you discuss any questions you have with your health care provider. Document Released: 12/19/2004 Document Revised: 12/01/2016 Document Reviewed:   01/19/2016 Elsevier Patient Education  2020 Else[employer] Inc[employer]  

## 2018-11-13 NOTE — Progress Notes (Signed)
GYNECOLOGY  VISIT   HPI: 68 y.o.   Widowed White or Caucasian Not Hispanic or Latino  female   [id number] with Patient's last menstrual period was 11/15/2015.   here for   Left lower quadrant pain  She c/o LLQ fullness for over the last month. She has some disc disease and isn't sure if that is radiating around to her LLQ. The pain is an intermittent, dull, mild pain. She is on metamucil, and is having a normal BM every day. She had a normal colonoscopy in 8/20, other than hemorrhoids.  She had fluid draining after she was done voiding, not sure where it came from. Happened one time earlier this week. It felt better in the LLQ with the drainage. She noticed some mucous, ? From her vagina, she wiped deeply and there was blood, but she was worried if she tore the skin, it was sore.   GYNECOLOGIC HISTORY: Patient's last menstrual period was 11/15/2015. Contraception: post menopausal  Menopausal hormone therapy: none         OB History    Gravida  1   Para  1   Term  1   Preterm  0   AB  0   Living  1     SAB  0   TAB  0   Ectopic  0   Multiple  0   Live Births  1              Patient Active Problem List   Diagnosis Date Noted  . Osteoarthritis of left knee 08/10/2015  . Status post total left knee replacement 08/10/2015  . Osteoarthritis of left hip 11/02/2014  . Status post total replacement of left hip 11/02/2014  . Osteoarthritis of right hip 09/08/2014  . Status post total replacement of right hip 09/08/2014  . PMB (postmenopausal bleeding) 02/28/2013  . Morbid obesity ([place]) 02/28/2013  . Pure hypercholesterolemia 02/28/2013  . Essential hypertension, benign 02/28/2013    Past Medical History:  Diagnosis Date  . Abnormal Pap smear of cervix 03/2009   Colpo Biopsy CIN I with HPV  . Arthritis   . Asthma   . Bilateral edema of lower extremity    Takes lasix if needed  . Bronchitis   . Disc disorder    bulging disc in thoracic area  . GERD  (gastroesophageal reflux disease)    occ  . Hypercholesteremia   . Hypertension since 1990's  . Lumbar herniated disc   . Obesity   . Pneumonia    hx  . Scoliosis   . Spinal stenosis   . Teeth grinding     Past Surgical History:  Procedure Laterality Date  . APPENDECTOMY  1965  . BACK SURGERY  1993   Thoracic and Lumbar  . BREAST BIOPSY Right 1990   benign cyst  . BREAST EXCISIONAL BIOPSY Right 1990   benign  . [place] SURGERY  2007  . COLONOSCOPY W/ POLYPECTOMY    . [place], as child   bilateral inguinal -1 during pregnancy.  Umbilical repair as child  . KNEE ARTHROSCOPY Right 1990  . [place] SURGERY  2006  . SHOULDER ARTHROSCOPY Left 1994   arthritis, hips,fingers  . TONSILLECTOMY AND ADENOIDECTOMY    . TOTAL HIP ARTHROPLASTY Right 09/08/2014   Procedure: RIGHT TOTAL HIP ARTHROPLASTY ANTERIOR APPROACH;  Surgeon: Mcarthur Rossetti, MD;  Location: [facility];  Service: Orthopedics;  Laterality: Right;  . TOTAL HIP ARTHROPLASTY Left 11/02/2014  Procedure: LEFT TOTAL HIP ARTHROPLASTY ANTERIOR APPROACH;  Surgeon: Mcarthur Rossetti, MD;  Location: [facility];  Service: Orthopedics;  Laterality: Left;  . TOTAL KNEE ARTHROPLASTY Right 2009  . TOTAL KNEE ARTHROPLASTY Left 08/10/2015   Procedure: LEFT TOTAL KNEE ARTHROPLASTY;  Surgeon: Mcarthur Rossetti, MD;  Location: [facility];  Service: Orthopedics;  Laterality: Left;    Current Outpatient Medications  Medication Sig Dispense Refill  . albuterol (ACCUNEB) 0.63 MG/3ML nebulizer solution Take 1 ampule by nebulization every 6 (six) hours as needed for wheezing or shortness of breath.     Marland Kitchen albuterol (PROAIR HFA) 108 (90 Base) MCG/ACT inhaler Inhale 2 puffs into the lungs every 6 (six) hours as needed for wheezing or shortness of breath.    . cholecalciferol (VITAMIN D) 1000 UNITS tablet Take 1,000 Units by mouth 2 (two) times daily.    . Cinnamon 500 MG capsule Take 500 mg by mouth 2 (two) times daily.    .  Coenzyme Q10 (COQ10) 200 MG CAPS Take 200 mg by mouth at bedtime.    . cyclobenzaprine (FLEXERIL) 10 MG tablet Take 1 tablet (10 mg total) by mouth 2 (two) times daily as needed for muscle spasms. 30 tablet 0  . diclofenac (VOLTAREN) 75 MG EC tablet Take 1 tablet (75 mg total) by mouth 2 (two) times daily as needed for mild pain. 60 tablet 1  . docusate sodium (COLACE) 100 MG capsule Take 200 mg by mouth at bedtime.    . felodipine (PLENDIL) 10 MG 24 hr tablet Take 10 mg by mouth daily.    Marland Kitchen FLAXSEED, LINSEED, PO Take 1,300 mg by mouth daily.     . Garlic 123XX123 MG CAPS Take 1,000 mg by mouth daily.    . Grape Seed 50 MG TABS Take 50 mg by mouth daily.    Marland Kitchen ipratropium-albuterol (DUONEB) 0.5-2.5 (3) MG/3ML SOLN Take 3 mLs by nebulization every 6 (six) hours as needed (shortness of breath).     . irbesartan (AVAPRO) 300 MG tablet Take 300 mg by mouth daily.    Marland Kitchen labetalol (NORMODYNE) 200 MG tablet Take 400 mg by mouth 2 (two) times daily.     Marland Kitchen lactobacillus acidophilus (BACID) TABS tablet Take 2 tablets by mouth every morning.    . Lecithin 1200 MG CAPS Take 1,200 mg by mouth daily.    Marland Kitchen lidocaine-hydrocortisone (LIDAZONE HC) 3-0.5 % CREA Place 1 Applicatorful rectally as needed (hemorrhoids).     . Lutein 20 MG TABS Take 20 mg by mouth daily.     . Multiple Vitamin (MULTIVITAMIN WITH MINERALS) TABS tablet Take 0.5 tablets by mouth 2 (two) times daily.     . Omega 3 1200 MG CAPS Take 1,200 mg by mouth 2 (two) times daily.    . simvastatin (ZOCOR) 20 MG tablet Take 20 mg by mouth at bedtime.     . vitamin C (ASCORBIC ACID) 500 MG tablet Take 500 mg by mouth daily.     No current facility-administered medications for this visit.      ALLERGIES: Adhesive [tape] and Lortab [hydrocodone-acetaminophen]  Family History  Problem Relation Age of Onset  . Breast cancer Sister 54       bilateral mastectomy- Breast cancer   . Hypertension Mother   . Heart disease Mother   . Stroke Mother   . Heart  disease Father   . Depression Father   . Alcohol abuse Father   . Stroke Maternal Grandmother   . Emphysema Paternal  Grandmother   . Breast cancer Maternal Aunt        Both aunts in 67 -9's  . Heart failure Paternal Grandfather   . Alcohol abuse Paternal Grandfather   . Depression Daughter     Social History   Socioeconomic History  . Marital status: Widowed    Spouse name: Not on file  . Number of children: Not on file  . Years of education: Not on file  . Highest education level: Not on file  Occupational History  . Not on file  Social Needs  . Financial resource strain: Not on file  . Food insecurity    Worry: Not on file    Inability: Not on file  . Transportation needs    Medical: Not on file    Non-medical: Not on file  Tobacco Use  . Smoking status: Never Smoker  . Smokeless tobacco: Never Used  Substance and Sexual Activity  . Alcohol use: Yes    Comment: occasionally   . Drug use: No  . Sexual activity: Not Currently    Partners: Male    Birth control/protection: Post-menopausal  Lifestyle  . Physical activity    Days per week: Not on file    Minutes per session: Not on file  . Stress: Not on file  Relationships  . Social Herbalist on phone: Not on file    Gets together: Not on file    Attends religious service: Not on file    Active member of club or organization: Not on file    Attends meetings of clubs or organizations: Not on file    Relationship status: Not on file  . Intimate partner violence    Fear of current or ex partner: Not on file    Emotionally abused: Not on file    Physically abused: Not on file    Forced sexual activity: Not on file  Other Topics Concern  . Not on file  Social History Narrative   Husband age 70 died 03-31-13.    Review of Systems  Constitutional: Negative.   HENT: Negative.   Eyes: Negative.   Respiratory: Negative.   Cardiovascular: Negative.   Gastrointestinal: Positive for abdominal pain.   Genitourinary: Negative.   Musculoskeletal: Negative.   Skin: Negative.   Neurological: Negative.   Endo/Heme/Allergies: Negative.   Psychiatric/Behavioral: Negative.     PHYSICAL EXAMINATION:    BP (!) 158/80 (BP Location: Right Arm, Patient Position: Sitting, Cuff Size: Large)   Pulse 90   Temp (!) 97.1 F (36.2 C) (Skin)   Resp 14   Ht 5\' 2"  (1.575 m)   Wt 254 lb 4 oz (115.3 kg)   LMP 11/15/2015 Comment: spotting 11-15-15  BMI 46.50 kg/m     General appearance: alert, cooperative and appears stated age Abdomen: soft, mildly tender in the LLQ, lateral to the SP region, just as tender with tensing her abdomen; non distended, no masses,  no organomegaly  Pelvic: External genitalia:  no lesions              Urethra:  normal appearing urethra with no masses, tenderness or lesions              Bartholins and Skenes: normal                 Vagina: normal appearing vagina with normal color and discharge, no lesions              Cervix: no  lesions              Bimanual Exam:  Uterus:  no masses or tenderness              Adnexa: no masses, mildly tender bilaterally              Rectovaginal: Yes.  .  Confirms.              Anus:  normal sphincter tone, no lesions  Chaperone was present for exam.  ASSESSMENT Abdominal/pelvic pain, suspect MS. C/O fullness Possible PMP bleeding, not clear    PLAN Return for gyn ultrasound Possible endometrial biopsy   An After Visit Summary was printed and given to the patient.

## 2018-11-14 ENCOUNTER — Ambulatory Visit: Payer: Medicare Other | Admitting: Obstetrics and Gynecology

## 2018-11-19 ENCOUNTER — Other Ambulatory Visit: Payer: Self-pay

## 2018-11-19 ENCOUNTER — Ambulatory Visit (INDEPENDENT_AMBULATORY_CARE_PROVIDER_SITE_OTHER): Payer: Medicare Other

## 2018-11-19 ENCOUNTER — Encounter: Payer: Self-pay | Admitting: Obstetrics and Gynecology

## 2018-11-19 ENCOUNTER — Ambulatory Visit (INDEPENDENT_AMBULATORY_CARE_PROVIDER_SITE_OTHER): Payer: Medicare Other | Admitting: Obstetrics and Gynecology

## 2018-11-19 DIAGNOSIS — R102 Pelvic and perineal pain: Secondary | ICD-10-CM | POA: Diagnosis not present

## 2018-11-19 DIAGNOSIS — N950 Postmenopausal bleeding: Secondary | ICD-10-CM | POA: Diagnosis not present

## 2018-11-19 DIAGNOSIS — R1032 Left lower quadrant pain: Secondary | ICD-10-CM

## 2018-11-19 NOTE — Progress Notes (Signed)
GYNECOLOGY  VISIT   HPI: 68 y.o.   Widowed White or Caucasian Not Hispanic or Latino  female   [id number] with Patient's last menstrual period was 11/15/2015.   here for evaluation of possible PMP bleeding, as well as abdominal/pelvic pain    GYNECOLOGIC HISTORY: Patient's last menstrual period was 11/15/2015. Contraception:Postmenopausal Menopausal hormone therapy: none        OB History    Gravida  1   Para  1   Term  1   Preterm  0   AB  0   Living  1     SAB  0   TAB  0   Ectopic  0   Multiple  0   Live Births  1              Patient Active Problem List   Diagnosis Date Noted  . Osteoarthritis of left knee 08/10/2015  . Status post total left knee replacement 08/10/2015  . Osteoarthritis of left hip 11/02/2014  . Status post total replacement of left hip 11/02/2014  . Osteoarthritis of right hip 09/08/2014  . Status post total replacement of right hip 09/08/2014  . PMB (postmenopausal bleeding) 02/28/2013  . Morbid obesity ([place]) 02/28/2013  . Pure hypercholesterolemia 02/28/2013  . Essential hypertension, benign 02/28/2013    Past Medical History:  Diagnosis Date  . Abnormal Pap smear of cervix 03/2009   Colpo Biopsy CIN I with HPV  . Arthritis   . Asthma   . Bilateral edema of lower extremity    Takes lasix if needed  . Bronchitis   . Disc disorder    bulging disc in thoracic area  . GERD (gastroesophageal reflux disease)    occ  . Hypercholesteremia   . Hypertension since 1990's  . Lumbar herniated disc   . Obesity   . Pneumonia    hx  . Scoliosis   . Spinal stenosis   . Teeth grinding     Past Surgical History:  Procedure Laterality Date  . APPENDECTOMY  1965  . BACK SURGERY  1993   Thoracic and Lumbar  . BREAST BIOPSY Right 1990   benign cyst  . BREAST EXCISIONAL BIOPSY Right 1990   benign  . [place] SURGERY  2007  . COLONOSCOPY W/ POLYPECTOMY    . [place], as child   bilateral inguinal -1 during  pregnancy.  Umbilical repair as child  . KNEE ARTHROSCOPY Right 1990  . [place] SURGERY  2006  . SHOULDER ARTHROSCOPY Left 1994   arthritis, hips,fingers  . TONSILLECTOMY AND ADENOIDECTOMY    . TOTAL HIP ARTHROPLASTY Right 09/08/2014   Procedure: RIGHT TOTAL HIP ARTHROPLASTY ANTERIOR APPROACH;  Surgeon: Mcarthur Rossetti, MD;  Location: [facility];  Service: Orthopedics;  Laterality: Right;  . TOTAL HIP ARTHROPLASTY Left 11/02/2014   Procedure: LEFT TOTAL HIP ARTHROPLASTY ANTERIOR APPROACH;  Surgeon: Mcarthur Rossetti, MD;  Location: [facility];  Service: Orthopedics;  Laterality: Left;  . TOTAL KNEE ARTHROPLASTY Right 2009  . TOTAL KNEE ARTHROPLASTY Left 08/10/2015   Procedure: LEFT TOTAL KNEE ARTHROPLASTY;  Surgeon: Mcarthur Rossetti, MD;  Location: [facility];  Service: Orthopedics;  Laterality: Left;    Current Outpatient Medications  Medication Sig Dispense Refill  . albuterol (ACCUNEB) 0.63 MG/3ML nebulizer solution Take 1 ampule by nebulization every 6 (six) hours as needed for wheezing or shortness of breath.     Marland Kitchen albuterol (PROAIR HFA) 108 (90 Base) MCG/ACT inhaler Inhale 2 puffs  into the lungs every 6 (six) hours as needed for wheezing or shortness of breath.    . cholecalciferol (VITAMIN D) 1000 UNITS tablet Take 1,000 Units by mouth 2 (two) times daily.    . Cinnamon 500 MG capsule Take 500 mg by mouth 2 (two) times daily.    . Coenzyme Q10 (COQ10) 200 MG CAPS Take 200 mg by mouth at bedtime.    . cyclobenzaprine (FLEXERIL) 10 MG tablet Take 1 tablet (10 mg total) by mouth 2 (two) times daily as needed for muscle spasms. 30 tablet 0  . diclofenac (VOLTAREN) 75 MG EC tablet Take 1 tablet (75 mg total) by mouth 2 (two) times daily as needed for mild pain. 60 tablet 1  . docusate sodium (COLACE) 100 MG capsule Take 200 mg by mouth at bedtime.    . felodipine (PLENDIL) 10 MG 24 hr tablet Take 10 mg by mouth daily.    Marland Kitchen FLAXSEED, LINSEED, PO Take 1,300 mg by mouth daily.     .  Garlic 123XX123 MG CAPS Take 1,000 mg by mouth daily.    . Grape Seed 50 MG TABS Take 50 mg by mouth daily.    . irbesartan (AVAPRO) 300 MG tablet Take 300 mg by mouth daily.    Marland Kitchen labetalol (NORMODYNE) 200 MG tablet Take 400 mg by mouth 2 (two) times daily.     Marland Kitchen lactobacillus acidophilus (BACID) TABS tablet Take 2 tablets by mouth every morning.    . Lecithin 1200 MG CAPS Take 1,200 mg by mouth daily.    . Lutein 20 MG TABS Take 20 mg by mouth daily.     . Multiple Vitamin (MULTIVITAMIN WITH MINERALS) TABS tablet Take 0.5 tablets by mouth 2 (two) times daily.     . Omega 3 1200 MG CAPS Take 1,200 mg by mouth 2 (two) times daily.    . simvastatin (ZOCOR) 20 MG tablet Take 20 mg by mouth at bedtime.     . vitamin C (ASCORBIC ACID) 500 MG tablet Take 500 mg by mouth daily.    Marland Kitchen ipratropium-albuterol (DUONEB) 0.5-2.5 (3) MG/3ML SOLN Take 3 mLs by nebulization every 6 (six) hours as needed (shortness of breath).     . lidocaine-hydrocortisone (LIDAZONE HC) 3-0.5 % CREA Place 1 Applicatorful rectally as needed (hemorrhoids).      No current facility-administered medications for this visit.      ALLERGIES: Adhesive [tape] and Lortab [hydrocodone-acetaminophen]  Family History  Problem Relation Age of Onset  . Breast cancer Sister 49       bilateral mastectomy- Breast cancer   . Hypertension Mother   . Heart disease Mother   . Stroke Mother   . Heart disease Father   . Depression Father   . Alcohol abuse Father   . Stroke Maternal Grandmother   . Emphysema Paternal Grandmother   . Breast cancer Maternal Aunt        Both aunts in 69 -36's  . Heart failure Paternal Grandfather   . Alcohol abuse Paternal Grandfather   . Depression Daughter     Social History   Socioeconomic History  . Marital status: Widowed    Spouse name: Not on file  . Number of children: Not on file  . Years of education: Not on file  . Highest education level: Not on file  Occupational History  . Not on file   Social Needs  . Financial resource strain: Not on file  . Food insecurity    Worry:  Not on file    Inability: Not on file  . Transportation needs    Medical: Not on file    Non-medical: Not on file  Tobacco Use  . Smoking status: Never Smoker  . Smokeless tobacco: Never Used  Substance and Sexual Activity  . Alcohol use: Yes    Comment: occasionally   . Drug use: No  . Sexual activity: Not Currently    Partners: Male    Birth control/protection: Post-menopausal  Lifestyle  . Physical activity    Days per week: Not on file    Minutes per session: Not on file  . Stress: Not on file  Relationships  . Social Herbalist on phone: Not on file    Gets together: Not on file    Attends religious service: Not on file    Active member of club or organization: Not on file    Attends meetings of clubs or organizations: Not on file    Relationship status: Not on file  . Intimate partner violence    Fear of current or ex partner: Not on file    Emotionally abused: Not on file    Physically abused: Not on file    Forced sexual activity: Not on file  Other Topics Concern  . Not on file  Social History Narrative   Husband age 8 died 03-29-13.    Review of Systems  Constitutional: Negative.   HENT: Negative.   Eyes: Negative.   Respiratory: Negative.   Cardiovascular: Negative.   Gastrointestinal: Positive for abdominal pain.  Genitourinary:       Pelvic pain  Musculoskeletal: Negative.   Skin: Negative.   Neurological: Negative.   Endo/Heme/Allergies: Negative.   Psychiatric/Behavioral: Negative.     PHYSICAL EXAMINATION:    BP 130/88 (BP Location: Right Arm, Patient Position: Sitting, Cuff Size: Large)   Pulse 80   Temp 97.8 F (36.6 C) (Skin)   Wt 254 lb (115.2 kg)   LMP 11/15/2015 Comment: spotting 11-15-15  BMI 46.46 kg/m     General appearance: alert, cooperative and appears stated age  Ultrasound images reviewed with the patient. Normal  ultrasound, thin stripe, normal adnexa.   ASSESSMENT Possible PMP bleeding, normal imaging C/O abdominal/pelvic pain, suspect MS pain, normal ultrasound    PLAN Patient reassured Routine F/U   An After Visit Summary was printed and given to the patient.

## 2018-12-04 ENCOUNTER — Telehealth: Payer: Self-pay | Admitting: Obstetrics and Gynecology

## 2018-12-04 NOTE — Telephone Encounter (Signed)
Call to patient. Patient states she had an "incident" last night and is concerned. States she went to the bathroom and starting expelling gas vaginally. Patient states there was "a lot of air and I know it was not coming from my rectum." Patient voiced concerned over the amount of air passed and how it caught her off guard. Also complaining of a thin, water-like fluid that passed with the gas. Knows it wasn't urine. Denies urinary symptoms or odor to fluid. States no pain, but that she just "feels full."  Patient unsure what she needs to do at this point after having a normal gyn PUS and exam. Patient states, "I would just like to get this taken care of."  OV offered. Patient agreeable. Patient scheduled for 12-05-2018 at 1515. Covid prescreening negative.  Routing to provider and will close encounter.

## 2018-12-04 NOTE — Telephone Encounter (Signed)
Patient's daughter Tilda Burrow is calling with concerns for her mother. Deanna states her mother is having "feelings of fullness and a clear discharge". DPR on file to talk with Deanna.

## 2018-12-05 ENCOUNTER — Ambulatory Visit (INDEPENDENT_AMBULATORY_CARE_PROVIDER_SITE_OTHER): Payer: Medicare Other | Admitting: Obstetrics and Gynecology

## 2018-12-05 ENCOUNTER — Encounter: Payer: Self-pay | Admitting: Obstetrics and Gynecology

## 2018-12-05 ENCOUNTER — Other Ambulatory Visit: Payer: Self-pay

## 2018-12-05 VITALS — BP 140/80 | HR 76 | Temp 97.1°F | Wt 251.2 lb

## 2018-12-05 DIAGNOSIS — N842 Polyp of vagina: Secondary | ICD-10-CM

## 2018-12-05 DIAGNOSIS — N898 Other specified noninflammatory disorders of vagina: Secondary | ICD-10-CM

## 2018-12-05 NOTE — Progress Notes (Signed)
GYNECOLOGY  VISIT   HPI: 68 y.o.   Widowed White or Caucasian Not Hispanic or Latino  female   [id number] with Patient's last menstrual period was 11/15/2015.   here for gas that she has been expelling vaginally. Also reports thin, water-like fluid being passed with the gas. No odor or vaginal discharge. Reports wearing a mini pad to ensure she is not leaking urine. Reports mini pad is always dry. Denies any urinary symptoms. Still feels fullness on left side. 2 days ago she had excessive gas coming out of her vagina. Felt pressure in her vagina, sat on the toilet, had 3-4 seconds of gas coming out of her vagina, then some wet fluid (not urine), watery. No vaginal odor. No bleeding. She just had a normal GYN ultrasound last month. She has never passed gas from her vaginal like this before Normal bowel movements.  GYNECOLOGIC HISTORY: Patient's last menstrual period was 11/15/2015. Contraception: Postmenopausal Menopausal hormone therapy: None        OB History    Gravida  1   Para  1   Term  1   Preterm  0   AB  0   Living  1     SAB  0   TAB  0   Ectopic  0   Multiple  0   Live Births  1              Patient Active Problem List   Diagnosis Date Noted  . Osteoarthritis of left knee 08/10/2015  . Status post total left knee replacement 08/10/2015  . Osteoarthritis of left hip 11/02/2014  . Status post total replacement of left hip 11/02/2014  . Osteoarthritis of right hip 09/08/2014  . Status post total replacement of right hip 09/08/2014  . PMB (postmenopausal bleeding) 02/28/2013  . Morbid obesity ([place]) 02/28/2013  . Pure hypercholesterolemia 02/28/2013  . Essential hypertension, benign 02/28/2013    Past Medical History:  Diagnosis Date  . Abnormal Pap smear of cervix 03/2009   Colpo Biopsy CIN I with HPV  . Arthritis   . Asthma   . Bilateral edema of lower extremity    Takes lasix if needed  . Bronchitis   . Disc disorder    bulging disc in thoracic  area  . GERD (gastroesophageal reflux disease)    occ  . Hypercholesteremia   . Hypertension since 1990's  . Lumbar herniated disc   . Obesity   . Pneumonia    hx  . Scoliosis   . Spinal stenosis   . Teeth grinding     Past Surgical History:  Procedure Laterality Date  . APPENDECTOMY  1965  . BACK SURGERY  1993   Thoracic and Lumbar  . BREAST BIOPSY Right 1990   benign cyst  . BREAST EXCISIONAL BIOPSY Right 1990   benign  . [place] SURGERY  2007  . COLONOSCOPY W/ POLYPECTOMY    . [place], as child   bilateral inguinal -1 during pregnancy.  Umbilical repair as child  . KNEE ARTHROSCOPY Right 1990  . [place] SURGERY  2006  . SHOULDER ARTHROSCOPY Left 1994   arthritis, hips,fingers  . TONSILLECTOMY AND ADENOIDECTOMY    . TOTAL HIP ARTHROPLASTY Right 09/08/2014   Procedure: RIGHT TOTAL HIP ARTHROPLASTY ANTERIOR APPROACH;  Surgeon: Mcarthur Rossetti, MD;  Location: [facility];  Service: Orthopedics;  Laterality: Right;  . TOTAL HIP ARTHROPLASTY Left 11/02/2014   Procedure: LEFT TOTAL HIP ARTHROPLASTY ANTERIOR APPROACH;  Surgeon: Mcarthur Rossetti, MD;  Location: [facility];  Service: Orthopedics;  Laterality: Left;  . TOTAL KNEE ARTHROPLASTY Right 2009  . TOTAL KNEE ARTHROPLASTY Left 08/10/2015   Procedure: LEFT TOTAL KNEE ARTHROPLASTY;  Surgeon: Mcarthur Rossetti, MD;  Location: [facility];  Service: Orthopedics;  Laterality: Left;    Current Outpatient Medications  Medication Sig Dispense Refill  . albuterol (ACCUNEB) 0.63 MG/3ML nebulizer solution Take 1 ampule by nebulization every 6 (six) hours as needed for wheezing or shortness of breath.     Marland Kitchen albuterol (PROAIR HFA) 108 (90 Base) MCG/ACT inhaler Inhale 2 puffs into the lungs every 6 (six) hours as needed for wheezing or shortness of breath.    . cholecalciferol (VITAMIN D) 1000 UNITS tablet Take 1,000 Units by mouth 2 (two) times daily.    . Cinnamon 500 MG capsule Take 500 mg by mouth 2 (two) times  daily.    . Coenzyme Q10 (COQ10) 200 MG CAPS Take 200 mg by mouth at bedtime.    . cyclobenzaprine (FLEXERIL) 10 MG tablet Take 1 tablet (10 mg total) by mouth 2 (two) times daily as needed for muscle spasms. 30 tablet 0  . diclofenac (VOLTAREN) 75 MG EC tablet Take 1 tablet (75 mg total) by mouth 2 (two) times daily as needed for mild pain. 60 tablet 1  . docusate sodium (COLACE) 100 MG capsule Take 200 mg by mouth at bedtime.    . felodipine (PLENDIL) 10 MG 24 hr tablet Take 10 mg by mouth daily.    Marland Kitchen FLAXSEED, LINSEED, PO Take 1,300 mg by mouth daily.     . Garlic 123XX123 MG CAPS Take 1,000 mg by mouth daily.    . Grape Seed 50 MG TABS Take 50 mg by mouth daily.    Marland Kitchen ipratropium-albuterol (DUONEB) 0.5-2.5 (3) MG/3ML SOLN Take 3 mLs by nebulization every 6 (six) hours as needed (shortness of breath).     . irbesartan (AVAPRO) 300 MG tablet Take 300 mg by mouth daily.    Marland Kitchen labetalol (NORMODYNE) 200 MG tablet Take 400 mg by mouth 2 (two) times daily.     Marland Kitchen lactobacillus acidophilus (BACID) TABS tablet Take 2 tablets by mouth every morning.    . Lecithin 1200 MG CAPS Take 1,200 mg by mouth daily.    Marland Kitchen lidocaine-hydrocortisone (LIDAZONE HC) 3-0.5 % CREA Place 1 Applicatorful rectally as needed (hemorrhoids).     . Lutein 20 MG TABS Take 20 mg by mouth daily.     . Multiple Vitamin (MULTIVITAMIN WITH MINERALS) TABS tablet Take 0.5 tablets by mouth 2 (two) times daily.     . Omega 3 1200 MG CAPS Take 1,200 mg by mouth 2 (two) times daily.    . simvastatin (ZOCOR) 20 MG tablet Take 20 mg by mouth at bedtime.     . vitamin C (ASCORBIC ACID) 500 MG tablet Take 500 mg by mouth daily.     No current facility-administered medications for this visit.      ALLERGIES: Adhesive [tape] and Lortab [hydrocodone-acetaminophen]  Family History  Problem Relation Age of Onset  . Breast cancer Sister 39       bilateral mastectomy- Breast cancer   . Hypertension Mother   . Heart disease Mother   . Stroke  Mother   . Heart disease Father   . Depression Father   . Alcohol abuse Father   . Stroke Maternal Grandmother   . Emphysema Paternal Grandmother   . Breast cancer Maternal Aunt  Both aunts in 79 -51's  . Heart failure Paternal Grandfather   . Alcohol abuse Paternal Grandfather   . Depression Daughter     Social History   Socioeconomic History  . Marital status: Widowed    Spouse name: Not on file  . Number of children: Not on file  . Years of education: Not on file  . Highest education level: Not on file  Occupational History  . Not on file  Social Needs  . Financial resource strain: Not on file  . Food insecurity    Worry: Not on file    Inability: Not on file  . Transportation needs    Medical: Not on file    Non-medical: Not on file  Tobacco Use  . Smoking status: Never Smoker  . Smokeless tobacco: Never Used  Substance and Sexual Activity  . Alcohol use: Yes    Comment: occasionally   . Drug use: No  . Sexual activity: Not Currently    Partners: Male    Birth control/protection: Post-menopausal  Lifestyle  . Physical activity    Days per week: Not on file    Minutes per session: Not on file  . Stress: Not on file  Relationships  . Social Herbalist on phone: Not on file    Gets together: Not on file    Attends religious service: Not on file    Active member of club or organization: Not on file    Attends meetings of clubs or organizations: Not on file    Relationship status: Not on file  . Intimate partner violence    Fear of current or ex partner: Not on file    Emotionally abused: Not on file    Physically abused: Not on file    Forced sexual activity: Not on file  Other Topics Concern  . Not on file  Social History Narrative   Husband age 71 died 04-11-2013.    Review of Systems  Constitutional: Negative.   HENT: Negative.   Eyes: Negative.   Respiratory: Negative.   Cardiovascular: Negative.   Gastrointestinal:        Abdominal fullness  Genitourinary: Negative.   Musculoskeletal: Negative.   Skin: Negative.   Neurological: Negative.   Endo/Heme/Allergies: Negative.   Psychiatric/Behavioral: Negative.     PHYSICAL EXAMINATION:    BP 140/80 (BP Location: Right Arm, Patient Position: Sitting, Cuff Size: Large)   Pulse 76   Temp (!) 97.1 F (36.2 C) (Skin)   Wt 251 lb 3.2 oz (113.9 kg)   LMP 11/15/2015 Comment: spotting 11-15-15  BMI 45.95 kg/m     General appearance: alert, cooperative and appears stated age  Pelvic: External genitalia:  no lesions              Urethra:  normal appearing urethra with no masses, tenderness or lesions              Bartholins and Skenes: normal                 Vagina: mildly atrophic appearing vagina with normal color. Slight increase in watery, white vaginal d/c. Posterior vaginal wall polyp in the lower 1/3 of the vagina. The posterior wall of the vagina was visualized with the colposcope, no evidence of rectovaginal fistula.               Cervix: no lesions             Rectovaginal: normal feeling rectovaginal  wall, no obvious areas of thinning               Anus:  normal sphincter tone, no lesions  Chaperone was present for exam.  ASSESSMENT Episode of significant vaginal flatus. No risk factors for recto-vaginal fistula. No signs of fistula with colposcopic exam of the posterior vaginal wall.  Vaginal discharge    PLAN Will send nuswab vaginitis panel, treat accordingly If she has continued issues with excessive vaginal flatus, will further investigate for a RV fistula (ie put methylene blue in the rectum, or put water in the vagina and air in the rectum)   An After Visit Summary was printed and given to the patient.  ~15 minutes spent face to face with the patient, over 50% in counseling

## 2018-12-08 LAB — NUSWAB VAGINITIS (VG)
Candida albicans, NAA: NEGATIVE
Candida glabrata, NAA: NEGATIVE
Trich vag by NAA: NEGATIVE

## 2018-12-22 DIAGNOSIS — M25571 Pain in right ankle and joints of right foot: Secondary | ICD-10-CM | POA: Diagnosis not present

## 2019-01-21 ENCOUNTER — Ambulatory Visit: Payer: Medicare Other | Attending: Internal Medicine

## 2019-01-21 DIAGNOSIS — Z23 Encounter for immunization: Secondary | ICD-10-CM | POA: Diagnosis not present

## 2019-01-21 NOTE — Progress Notes (Signed)
   Covid-19 Vaccination Clinic  Name:  [name]    MRN: [id number] DOB: Oct 10, 1950  01/21/2019  [name] was observed post Covid-19 immunization for 15 minutes without incidence. She was provided with Vaccine Information Sheet and instruction to access the V-Safe system.   [name] was instructed to call 911 with any severe reactions post vaccine: Marland Kitchen Difficulty breathing  . Swelling of your face and throat  . A fast heartbeat  . A bad rash all over your body  . Dizziness and weakness    Immunizations Administered    Name Date Dose VIS Date Route   Pfizer COVID-19 Vaccine 01/21/2019 10:44 AM 0.3 mL 12/13/2018 Intramuscular   Manufacturer: [place]   Lot: [id number]   [place]: [id number]

## 2019-02-11 ENCOUNTER — Ambulatory Visit: Payer: Medicare Other | Attending: Internal Medicine

## 2019-02-11 DIAGNOSIS — Z23 Encounter for immunization: Secondary | ICD-10-CM | POA: Insufficient documentation

## 2019-02-11 NOTE — Progress Notes (Signed)
   Covid-19 Vaccination Clinic  Name:  Jonai Ristine    MRN: [id number] DOB: 1950/01/09  02/11/2019  Ms. Balkema was observed post Covid-19 immunization for 15 minutes without incidence. She was provided with Vaccine Information Sheet and instruction to access the V-Safe system.   Ms. Cunico was instructed to call 911 with any severe reactions post vaccine: Marland Kitchen Difficulty breathing  . Swelling of your face and throat  . A fast heartbeat  . A bad rash all over your body  . Dizziness and weakness    Immunizations Administered    Name Date Dose VIS Date Route   Pfizer COVID-19 Vaccine 02/11/2019 10:02 AM 0.3 mL 12/13/2018 Intramuscular   Manufacturer: [place]   Lot: [id number]   [place]: [id number]

## 2019-03-04 ENCOUNTER — Other Ambulatory Visit: Payer: Self-pay | Admitting: Family Medicine

## 2019-03-04 DIAGNOSIS — R921 Mammographic calcification found on diagnostic imaging of breast: Secondary | ICD-10-CM

## 2019-07-04 ENCOUNTER — Ambulatory Visit
Admission: RE | Admit: 2019-07-04 | Discharge: 2019-07-04 | Disposition: A | Discharge status: Home / Self Care | Payer: Medicare Other | Source: Ambulatory Visit | Attending: Family Medicine | Admitting: Family Medicine

## 2019-07-04 ENCOUNTER — Other Ambulatory Visit: Payer: Self-pay

## 2019-07-04 DIAGNOSIS — R921 Mammographic calcification found on diagnostic imaging of breast: Secondary | ICD-10-CM

## 2019-08-06 DIAGNOSIS — R05 Cough: Secondary | ICD-10-CM | POA: Diagnosis not present

## 2019-08-06 DIAGNOSIS — J45901 Unspecified asthma with (acute) exacerbation: Secondary | ICD-10-CM | POA: Diagnosis not present

## 2019-08-19 DIAGNOSIS — J988 Other specified respiratory disorders: Secondary | ICD-10-CM | POA: Diagnosis not present

## 2019-10-06 DIAGNOSIS — E669 Obesity, unspecified: Secondary | ICD-10-CM | POA: Diagnosis not present

## 2019-10-06 DIAGNOSIS — J209 Acute bronchitis, unspecified: Secondary | ICD-10-CM | POA: Diagnosis not present

## 2019-10-06 DIAGNOSIS — R053 Chronic cough: Secondary | ICD-10-CM | POA: Diagnosis not present

## 2019-10-06 DIAGNOSIS — J45909 Unspecified asthma, uncomplicated: Secondary | ICD-10-CM | POA: Diagnosis not present

## 2019-10-07 DIAGNOSIS — J189 Pneumonia, unspecified organism: Secondary | ICD-10-CM | POA: Diagnosis not present

## 2019-10-08 DIAGNOSIS — Z03818 Encounter for observation for suspected exposure to other biological agents ruled out: Secondary | ICD-10-CM | POA: Diagnosis not present

## 2019-10-08 DIAGNOSIS — J189 Pneumonia, unspecified organism: Secondary | ICD-10-CM | POA: Diagnosis not present

## 2019-11-01 DIAGNOSIS — Z23 Encounter for immunization: Secondary | ICD-10-CM | POA: Diagnosis not present

## 2020-03-12 DIAGNOSIS — S99922A Unspecified injury of left foot, initial encounter: Secondary | ICD-10-CM | POA: Diagnosis not present

## 2020-03-12 DIAGNOSIS — S76012A Strain of muscle, fascia and tendon of left hip, initial encounter: Secondary | ICD-10-CM | POA: Diagnosis not present

## 2020-03-15 DIAGNOSIS — M79672 Pain in left foot: Secondary | ICD-10-CM | POA: Diagnosis not present

## 2020-03-15 DIAGNOSIS — M25572 Pain in left ankle and joints of left foot: Secondary | ICD-10-CM | POA: Diagnosis not present

## 2020-03-17 ENCOUNTER — Other Ambulatory Visit: Payer: Self-pay | Admitting: Family Medicine

## 2020-03-17 DIAGNOSIS — R921 Mammographic calcification found on diagnostic imaging of breast: Secondary | ICD-10-CM

## 2020-04-11 DIAGNOSIS — T280XXA Burn of mouth and pharynx, initial encounter: Secondary | ICD-10-CM | POA: Diagnosis not present

## 2020-04-11 DIAGNOSIS — X100XXA Contact with hot drinks, initial encounter: Secondary | ICD-10-CM | POA: Diagnosis not present

## 2020-04-19 DIAGNOSIS — H10011 Acute follicular conjunctivitis, right eye: Secondary | ICD-10-CM | POA: Diagnosis not present

## 2020-04-19 DIAGNOSIS — Z20822 Contact with and (suspected) exposure to covid-19: Secondary | ICD-10-CM | POA: Diagnosis not present

## 2020-04-23 DIAGNOSIS — B309 Viral conjunctivitis, unspecified: Secondary | ICD-10-CM | POA: Diagnosis not present

## 2020-04-23 DIAGNOSIS — H10231 Serous conjunctivitis, except viral, right eye: Secondary | ICD-10-CM | POA: Diagnosis not present

## 2020-05-08 DIAGNOSIS — U071 COVID-19: Secondary | ICD-10-CM | POA: Diagnosis not present

## 2020-05-09 ENCOUNTER — Encounter (HOSPITAL_BASED_OUTPATIENT_CLINIC_OR_DEPARTMENT_OTHER): Payer: Self-pay

## 2020-05-09 ENCOUNTER — Emergency Department (HOSPITAL_BASED_OUTPATIENT_CLINIC_OR_DEPARTMENT_OTHER): Payer: Medicare Other

## 2020-05-09 ENCOUNTER — Other Ambulatory Visit: Payer: Self-pay

## 2020-05-09 ENCOUNTER — Emergency Department (HOSPITAL_BASED_OUTPATIENT_CLINIC_OR_DEPARTMENT_OTHER)
Admission: EM | Admit: 2020-05-09 | Discharge: 2020-05-09 | Disposition: A | Discharge status: Home / Self Care | Payer: Medicare Other | Attending: Emergency Medicine | Admitting: Emergency Medicine

## 2020-05-09 DIAGNOSIS — Z79899 Other long term (current) drug therapy: Secondary | ICD-10-CM | POA: Diagnosis not present

## 2020-05-09 DIAGNOSIS — J45909 Unspecified asthma, uncomplicated: Secondary | ICD-10-CM | POA: Diagnosis not present

## 2020-05-09 DIAGNOSIS — R0981 Nasal congestion: Secondary | ICD-10-CM | POA: Diagnosis present

## 2020-05-09 DIAGNOSIS — R059 Cough, unspecified: Secondary | ICD-10-CM | POA: Diagnosis not present

## 2020-05-09 DIAGNOSIS — Z20822 Contact with and (suspected) exposure to covid-19: Secondary | ICD-10-CM | POA: Diagnosis not present

## 2020-05-09 DIAGNOSIS — Z96643 Presence of artificial hip joint, bilateral: Secondary | ICD-10-CM | POA: Diagnosis not present

## 2020-05-09 DIAGNOSIS — Z96653 Presence of artificial knee joint, bilateral: Secondary | ICD-10-CM | POA: Diagnosis not present

## 2020-05-09 DIAGNOSIS — J189 Pneumonia, unspecified organism: Secondary | ICD-10-CM | POA: Diagnosis not present

## 2020-05-09 DIAGNOSIS — J0100 Acute maxillary sinusitis, unspecified: Secondary | ICD-10-CM | POA: Diagnosis not present

## 2020-05-09 DIAGNOSIS — I10 Essential (primary) hypertension: Secondary | ICD-10-CM | POA: Diagnosis not present

## 2020-05-09 LAB — RESP PANEL BY RT-PCR (FLU A&B, COVID) ARPGX2
Influenza A by PCR: NEGATIVE
Influenza B by PCR: NEGATIVE
SARS Coronavirus 2 by RT PCR: NEGATIVE

## 2020-05-09 MED ORDER — AMOXICILLIN-POT CLAVULANATE 875-125 MG PO TABS: 1.0000 | ORAL_TABLET | Freq: Two times a day (BID) | ORAL | 0 refills | Status: DC

## 2020-05-09 NOTE — ED Provider Notes (Signed)
[facility] EMERGENCY DEPT Provider Note   CSN: [id number] Arrival date & time: 05/09/20  1145     History Chief Complaint  Patient presents with  . Cough  . Near Syncope    [name] is a 70 y.o. female.  Has never had severe seasonal allergies.  The history is provided by the patient.  URI Presenting symptoms: congestion, cough, ear pain (right), facial pain (left maxillary), fever (100 degrees), rhinorrhea and sore throat   Severity:  Moderate Onset quality:  Gradual Duration:  1 month Timing:  Constant Progression:  Worsening (got some better and worsened again yesterday) Chronicity:  New Relieved by:  Nothing Worsened by:  Nothing Ineffective treatments:  OTC medications Associated symptoms: sneezing   Associated symptoms: no arthralgias   Risk factors: sick contacts (son in law with similar symptoms at onset)   Risk factors comment:  Vaccinated for COVID      Past Medical History:  Diagnosis Date  . Abnormal Pap smear of cervix 03/2009   Colpo Biopsy CIN I with HPV  . Arthritis   . Asthma   . Bilateral edema of lower extremity    Takes lasix if needed  . Bronchitis   . Disc disorder    bulging disc in thoracic area  . GERD (gastroesophageal reflux disease)    occ  . Hypercholesteremia   . Hypertension since 1990's  . Lumbar herniated disc   . Obesity   . Pneumonia    hx  . Scoliosis   . Spinal stenosis   . Teeth grinding     Patient Active Problem List   Diagnosis Date Noted  . Osteoarthritis of left knee 08/10/2015  . Status post total left knee replacement 08/10/2015  . Osteoarthritis of left hip 11/02/2014  . Status post total replacement of left hip 11/02/2014  . Osteoarthritis of right hip 09/08/2014  . Status post total replacement of right hip 09/08/2014  . PMB (postmenopausal bleeding) 02/28/2013  . Morbid obesity ([place]) 02/28/2013  . Pure hypercholesterolemia 02/28/2013  . Essential hypertension, benign  02/28/2013    Past Surgical History:  Procedure Laterality Date  . APPENDECTOMY  1965  . BACK SURGERY  1993   Thoracic and Lumbar  . BREAST BIOPSY Right 1990   benign cyst  . BREAST EXCISIONAL BIOPSY Right 1990   benign  . [place] SURGERY  2007  . COLONOSCOPY W/ POLYPECTOMY    . [place], as child   bilateral inguinal -1 during pregnancy.  Umbilical repair as child  . KNEE ARTHROSCOPY Right 1990  . [place] SURGERY  2006  . SHOULDER ARTHROSCOPY Left 1994   arthritis, hips,fingers  . TONSILLECTOMY AND ADENOIDECTOMY    . TOTAL HIP ARTHROPLASTY Right 09/08/2014   Procedure: RIGHT TOTAL HIP ARTHROPLASTY ANTERIOR APPROACH;  Surgeon: Mcarthur Rossetti, MD;  Location: [facility];  Service: Orthopedics;  Laterality: Right;  . TOTAL HIP ARTHROPLASTY Left 11/02/2014   Procedure: LEFT TOTAL HIP ARTHROPLASTY ANTERIOR APPROACH;  Surgeon: Mcarthur Rossetti, MD;  Location: [facility];  Service: Orthopedics;  Laterality: Left;  . TOTAL KNEE ARTHROPLASTY Right 2009  . TOTAL KNEE ARTHROPLASTY Left 08/10/2015   Procedure: LEFT TOTAL KNEE ARTHROPLASTY;  Surgeon: Mcarthur Rossetti, MD;  Location: [facility];  Service: Orthopedics;  Laterality: Left;     OB History    Gravida  1   Para  1   Term  1   Preterm  0   AB  0  Living  1     SAB  0   IAB  0   Ectopic  0   Multiple  0   Live Births  1           Family History  Problem Relation Age of Onset  . Breast cancer Sister 48       bilateral mastectomy- Breast cancer   . Hypertension Mother   . Heart disease Mother   . Stroke Mother   . Heart disease Father   . Depression Father   . Alcohol abuse Father   . Stroke Maternal Grandmother   . Emphysema Paternal Grandmother   . Breast cancer Maternal Aunt        Both aunts in 80 -33's  . Heart failure Paternal Grandfather   . Alcohol abuse Paternal Grandfather   . Depression Daughter     Social History   Tobacco Use  . Smoking status: Never Smoker   . Smokeless tobacco: Never Used  Vaping Use  . Vaping Use: Never used  Substance Use Topics  . Alcohol use: Not Currently    Comment: occasionally   . Drug use: No    Home Medications Prior to Admission medications   Medication Sig Start Date End Date Taking? Authorizing Provider  amoxicillin-clavulanate (AUGMENTIN) 875-125 MG tablet Take 1 tablet by mouth every 12 (twelve) hours. 05/09/20  Yes Arnaldo Natal, MD  albuterol (ACCUNEB) 0.63 MG/3ML nebulizer solution Take 1 ampule by nebulization every 6 (six) hours as needed for wheezing or shortness of breath.  02/15/15   [provider]  albuterol (PROAIR HFA) 108 (90 Base) MCG/ACT inhaler Inhale 2 puffs into the lungs every 6 (six) hours as needed for wheezing or shortness of breath.    [provider]  cholecalciferol (VITAMIN D) 1000 UNITS tablet Take 1,000 Units by mouth 2 (two) times daily.    [provider]  Cinnamon 500 MG capsule Take 500 mg by mouth 2 (two) times daily.    [provider]  Coenzyme Q10 (COQ10) 200 MG CAPS Take 200 mg by mouth at bedtime.    [provider]  cyclobenzaprine (FLEXERIL) 10 MG tablet Take 1 tablet (10 mg total) by mouth 2 (two) times daily as needed for muscle spasms. 08/13/15   Mcarthur Rossetti, MD  diclofenac (VOLTAREN) 75 MG EC tablet Take 1 tablet (75 mg total) by mouth 2 (two) times daily as needed for mild pain. 11/05/14   Pete Pelt, PA-C  docusate sodium (COLACE) 100 MG capsule Take 200 mg by mouth at bedtime.    [provider]  felodipine (PLENDIL) 10 MG 24 hr tablet Take 10 mg by mouth daily.    [provider]  FLAXSEED, LINSEED, PO Take 1,300 mg by mouth daily.     [provider]  Garlic 1308 MG CAPS Take 1,000 mg by mouth daily.    [provider]  Grape Seed 50 MG TABS Take 50 mg by mouth daily.    [provider]  ipratropium-albuterol (DUONEB) 0.5-2.5 (3) MG/3ML SOLN Take 3 mLs by  nebulization every 6 (six) hours as needed (shortness of breath).     [provider]  irbesartan (AVAPRO) 300 MG tablet Take 300 mg by mouth daily.    [provider]  labetalol (NORMODYNE) 200 MG tablet Take 400 mg by mouth 2 (two) times daily.     [provider]  lactobacillus acidophilus (BACID) TABS tablet Take 2 tablets by  mouth every morning.    [provider]  Lecithin 1200 MG CAPS Take 1,200 mg by mouth daily.    [provider]  lidocaine-hydrocortisone (LIDAZONE HC) 3-0.5 % CREA Place 1 Applicatorful rectally as needed (hemorrhoids).     [provider]  Lutein 20 MG TABS Take 20 mg by mouth daily.     [provider]  Multiple Vitamin (MULTIVITAMIN WITH MINERALS) TABS tablet Take 0.5 tablets by mouth 2 (two) times daily.     [provider]  Omega 3 1200 MG CAPS Take 1,200 mg by mouth 2 (two) times daily.    [provider]  simvastatin (ZOCOR) 20 MG tablet Take 20 mg by mouth at bedtime.     [provider]  vitamin C (ASCORBIC ACID) 500 MG tablet Take 500 mg by mouth daily.    [provider]    Allergies    Etodolac and Adhesive [tape]  Review of Systems   Review of Systems  Constitutional: Positive for fever (100 degrees). Negative for chills.  HENT: Positive for congestion, ear pain (right), rhinorrhea, sneezing and sore throat.   Eyes: Negative for pain and visual disturbance.  Respiratory: Positive for cough. Negative for shortness of breath.   Cardiovascular: Negative for chest pain and palpitations.  Gastrointestinal: Negative for abdominal pain and vomiting.  Genitourinary: Negative for dysuria and hematuria.  Musculoskeletal: Negative for arthralgias and back pain.  Skin: Negative for color change and rash.  Neurological: Negative for seizures and syncope.  All other systems reviewed and are negative.   Physical Exam Updated Vital Signs BP (!) 151/70   Pulse 79    Temp 97.8 F (36.6 C) (Oral)   Resp 20   Ht 5\' 2"  (1.575 m)   Wt 111.6 kg   LMP 11/15/2015 Comment: spotting 11-15-15  SpO2 97%   BMI 44.99 kg/m   Physical Exam Vitals and nursing note reviewed.  Constitutional:      General: She is not in acute distress.    Appearance: She is well-developed.  HENT:     Head: Normocephalic and atraumatic.     Right Ear: Tympanic membrane, ear canal and external ear normal.     Left Ear: Tympanic membrane, ear canal and external ear normal.     Mouth/Throat:     Mouth: Mucous membranes are moist.     Pharynx: Posterior oropharyngeal erythema present.     Comments: No tonsillar enlargement, exudate, mass, or abscess Eyes:     Conjunctiva/sclera: Conjunctivae normal.     Comments: Watery discharge of both eyes  Cardiovascular:     Rate and Rhythm: Normal rate and regular rhythm.     Heart sounds: No murmur heard.   Pulmonary:     Effort: Pulmonary effort is normal. No respiratory distress.     Breath sounds: Normal breath sounds.  Abdominal:     Palpations: Abdomen is soft.     Tenderness: There is no abdominal tenderness.  Musculoskeletal:     Cervical back: Neck supple.  Skin:    General: Skin is warm and dry.  Neurological:     General: No focal deficit present.     Mental Status: She is alert.  Psychiatric:        Mood and Affect: Mood normal.     ED Results / Procedures / Treatments   Labs (all labs ordered are listed, but only abnormal results are displayed) Labs Reviewed  RESP PANEL BY RT-PCR (FLU A&B, COVID) ARPGX2  EKG None  Radiology DG Chest 1 View  Result Date: 05/09/2020 CLINICAL DATA:  Cough with upper respiratory symptoms for 2 months. Evaluate pneumonia. EXAM: CHEST  1 VIEW COMPARISON:  Radiographs 10/06/2019 and 04/28/2016. FINDINGS: 1235 hours. The heart size and mediastinal contours are stable with aortic atherosclerosis. The lungs demonstrate mild chronic interstitial prominence, but no airspace  disease, edema or pleural effusion. There is no pneumothorax. Patient is status post lower cervical fusion, and there are mild degenerative changes throughout the thoracic spine. IMPRESSION: No active cardiopulmonary process.  No evidence of pneumonia. Electronically Signed   By: Richardean Sale M.D.   On: 05/09/2020 12:52    Procedures Procedures   Medications Ordered in ED Medications - No data to display  ED Course  I have reviewed the triage vital signs and the nursing notes.  Pertinent labs & imaging results that were available during my care of the patient were reviewed by me and considered in my medical decision making (see chart for details).    MDM Rules/Calculators/A&P                          AUTUMNROSE YORE presents with approximately 1 month of ongoing upper respiratory symptoms that have waxed and waned but have recently worsened.  She has treated her symptoms with over-the-counter medicines and has not had any antibiotics.  She is overall well-appearing.  No evidence of hypoxia.  No evidence of asthma exacerbation.  COVID, flu, and RSV are all normal.  I will treat her with an antibiotic secondary to her course of ongoing illness for approximately 1 month.  She is suitable for discharge home and actually has an ENT follow-up this week. Final Clinical Impression(s) / ED Diagnoses Final diagnoses:  Acute maxillary sinusitis, recurrence not specified    Rx / DC Orders ED Discharge Orders         Ordered    amoxicillin-clavulanate (AUGMENTIN) 875-125 MG tablet  Every 12 hours        05/09/20 1342           Arnaldo Natal, MD 05/09/20 1348

## 2020-05-09 NOTE — ED Triage Notes (Signed)
Pt with cough, sore throat, nasal congestion and drainage x 2 months. Started to get better then suddenly became worse yesterday. Pt had 1 episode of vomiting this AM with near syncope. Pt has follow up with ENT later this week.

## 2020-05-13 ENCOUNTER — Other Ambulatory Visit: Payer: Self-pay

## 2020-05-13 ENCOUNTER — Encounter (INDEPENDENT_AMBULATORY_CARE_PROVIDER_SITE_OTHER): Payer: Self-pay | Admitting: Otolaryngology

## 2020-05-13 ENCOUNTER — Ambulatory Visit (INDEPENDENT_AMBULATORY_CARE_PROVIDER_SITE_OTHER): Payer: Medicare Other | Admitting: Otolaryngology

## 2020-05-13 VITALS — Temp 98.1°F

## 2020-05-13 DIAGNOSIS — J019 Acute sinusitis, unspecified: Secondary | ICD-10-CM

## 2020-05-13 DIAGNOSIS — J029 Acute pharyngitis, unspecified: Secondary | ICD-10-CM | POA: Diagnosis not present

## 2020-05-13 DIAGNOSIS — J310 Chronic rhinitis: Secondary | ICD-10-CM | POA: Diagnosis not present

## 2020-05-13 NOTE — Progress Notes (Signed)
HPI: [name] is a 70 y.o. female who presents for evaluation of problems with a chronic sore throat she has had for 2 months now.  It is getting better since she was seen in the ED on Sunday and was started on Augmentin 875 mg twice daily for 10 days.  Her PCP would only do a televisit with the patient as she had sore throat as well as nasal congestion and coughing.  She has had sinus pressure with green mucus discharge. At 1 point she lost her taste and smell but this has come back.  She was seen in urgent care and had a negative COVID test. She is doing little bit better today since starting on Augmentin that was prescribed to her while seen at the ED this past weekend.  Past Medical History:  Diagnosis Date  . Abnormal Pap smear of cervix 03/26/09   Colpo Biopsy CIN I with HPV  . Arthritis   . Asthma   . Bilateral edema of lower extremity    Takes lasix if needed  . Bronchitis   . Disc disorder    bulging disc in thoracic area  . GERD (gastroesophageal reflux disease)    occ  . Hypercholesteremia   . Hypertension since 1990's  . Lumbar herniated disc   . Obesity   . Pneumonia    hx  . Scoliosis   . Spinal stenosis   . Teeth grinding    Past Surgical History:  Procedure Laterality Date  . APPENDECTOMY  1965  . BACK SURGERY  1993   Thoracic and Lumbar  . BREAST BIOPSY Right 1990   benign cyst  . BREAST EXCISIONAL BIOPSY Right 1990   benign  . [place] SURGERY  2007  . COLONOSCOPY W/ POLYPECTOMY    . [place], as child   bilateral inguinal -1 during pregnancy.  Umbilical repair as child  . KNEE ARTHROSCOPY Right 1990  . [place] SURGERY  2006  . SHOULDER ARTHROSCOPY Left 1994   arthritis, hips,fingers  . TONSILLECTOMY AND ADENOIDECTOMY    . TOTAL HIP ARTHROPLASTY Right 09/08/2014   Procedure: RIGHT TOTAL HIP ARTHROPLASTY ANTERIOR APPROACH;  Surgeon: Mcarthur Rossetti, MD;  Location: [facility];  Service: Orthopedics;  Laterality: Right;  . TOTAL  HIP ARTHROPLASTY Left 11/02/2014   Procedure: LEFT TOTAL HIP ARTHROPLASTY ANTERIOR APPROACH;  Surgeon: Mcarthur Rossetti, MD;  Location: [facility];  Service: Orthopedics;  Laterality: Left;  . TOTAL KNEE ARTHROPLASTY Right 2009  . TOTAL KNEE ARTHROPLASTY Left 08/10/2015   Procedure: LEFT TOTAL KNEE ARTHROPLASTY;  Surgeon: Mcarthur Rossetti, MD;  Location: [facility];  Service: Orthopedics;  Laterality: Left;   Social History   Socioeconomic History  . Marital status: Widowed    Spouse name: Not on file  . Number of children: Not on file  . Years of education: Not on file  . Highest education level: Not on file  Occupational History  . Not on file  Tobacco Use  . Smoking status: Never Smoker  . Smokeless tobacco: Never Used  Vaping Use  . Vaping Use: Never used  Substance and Sexual Activity  . Alcohol use: Not Currently    Comment: occasionally   . Drug use: No  . Sexual activity: Not Currently    Partners: Male    Birth control/protection: Post-menopausal  Other Topics Concern  . Not on file  Social History Narrative   Husband age 54 died 26-Mar-2013.   Social Determinants  of Health   Financial Resource Strain: Not on file  Food Insecurity: Not on file  Transportation Needs: Not on file  Physical Activity: Not on file  Stress: Not on file  Social Connections: Not on file   Family History  Problem Relation Age of Onset  . Breast cancer Sister 69       bilateral mastectomy- Breast cancer   . Hypertension Mother   . Heart disease Mother   . Stroke Mother   . Heart disease Father   . Depression Father   . Alcohol abuse Father   . Stroke Maternal Grandmother   . Emphysema Paternal Grandmother   . Breast cancer Maternal Aunt        Both aunts in 45 -74's  . Heart failure Paternal Grandfather   . Alcohol abuse Paternal Grandfather   . Depression Daughter    Allergies  Allergen Reactions  . Etodolac Other (See Comments)  . Adhesive [Tape] Rash and Other (See  Comments)    Blisters - reaction to adhesive on any type of bandages if stay long time   Prior to Admission medications   Medication Sig Start Date End Date Taking? Authorizing Provider  albuterol (ACCUNEB) 0.63 MG/3ML nebulizer solution Take 1 ampule by nebulization every 6 (six) hours as needed for wheezing or shortness of breath.  02/15/15   [provider]  albuterol (PROAIR HFA) 108 (90 Base) MCG/ACT inhaler Inhale 2 puffs into the lungs every 6 (six) hours as needed for wheezing or shortness of breath.    [provider]  amoxicillin-clavulanate (AUGMENTIN) 875-125 MG tablet Take 1 tablet by mouth every 12 (twelve) hours. 05/09/20   Arnaldo Natal, MD  cholecalciferol (VITAMIN D) 1000 UNITS tablet Take 1,000 Units by mouth 2 (two) times daily.    [provider]  Cinnamon 500 MG capsule Take 500 mg by mouth 2 (two) times daily.    [provider]  Coenzyme Q10 (COQ10) 200 MG CAPS Take 200 mg by mouth at bedtime.    [provider]  cyclobenzaprine (FLEXERIL) 10 MG tablet Take 1 tablet (10 mg total) by mouth 2 (two) times daily as needed for muscle spasms. 08/13/15   Mcarthur Rossetti, MD  diclofenac (VOLTAREN) 75 MG EC tablet Take 1 tablet (75 mg total) by mouth 2 (two) times daily as needed for mild pain. 11/05/14   Pete Pelt, PA-C  docusate sodium (COLACE) 100 MG capsule Take 200 mg by mouth at bedtime.    [provider]  felodipine (PLENDIL) 10 MG 24 hr tablet Take 10 mg by mouth daily.    [provider]  FLAXSEED, LINSEED, PO Take 1,300 mg by mouth daily.     [provider]  Garlic 1914 MG CAPS Take 1,000 mg by mouth daily.    [provider]  Grape Seed 50 MG TABS Take 50 mg by mouth daily.    [provider]  ipratropium-albuterol (DUONEB) 0.5-2.5 (3) MG/3ML SOLN Take 3 mLs by nebulization every 6 (six) hours as needed (shortness of breath).     [provider]  irbesartan  (AVAPRO) 300 MG tablet Take 300 mg by mouth daily.    [provider]  labetalol (NORMODYNE) 200 MG tablet Take 400 mg by mouth 2 (two) times daily.     [provider]  lactobacillus acidophilus (BACID) TABS tablet Take 2 tablets by mouth every morning.    [provider]  Lecithin 1200 MG CAPS Take 1,200  mg by mouth daily.    [provider]  lidocaine-hydrocortisone (LIDAZONE HC) 3-0.5 % CREA Place 1 Applicatorful rectally as needed (hemorrhoids).     [provider]  Lutein 20 MG TABS Take 20 mg by mouth daily.     [provider]  Multiple Vitamin (MULTIVITAMIN WITH MINERALS) TABS tablet Take 0.5 tablets by mouth 2 (two) times daily.     [provider]  Omega 3 1200 MG CAPS Take 1,200 mg by mouth 2 (two) times daily.    [provider]  simvastatin (ZOCOR) 20 MG tablet Take 20 mg by mouth at bedtime.     [provider]  vitamin C (ASCORBIC ACID) 500 MG tablet Take 500 mg by mouth daily.    [provider]     Positive ROS: Otherwise negative  All other systems have been reviewed and were otherwise negative with the exception of those mentioned in the HPI and as above.  Physical Exam: Constitutional: Alert, well-appearing, no acute distress Ears: External ears without lesions or tenderness. Ear canals are clear bilaterally with intact, clear TMs.  Nasal: External nose without lesions. Septum is deviated to the left with moderate rhinitis.  After decongesting the nose nasal endoscopy was performed and on nasal endoscopy she has some thick mucus discharge.  No polyps noted.  No gross mucopurulent discharge noted..  Oral: Lips and gums without lesions. Tongue and palate mucosa without lesions. Posterior oropharynx clear.  Tonsil regions are benign bilaterally.  Tonsils are small.  Indirect laryngoscopy revealed a clear base of tongue vallecula and epiglottis.  Vocal cords were clear hypopharynx was  clear.  No purulence and minimal erythema. Neck: No palpable adenopathy or masses.  No palpable adenopathy on either side of the neck. Respiratory: Breathing comfortably.  Slight bronchial type cough. Skin: No facial/neck lesions or rash noted.  Procedures  Assessment: Probable sinusitis which is getting better on Augmentin.  Plan: Recommended regular use of nasal steroid spray.  She has tried this in the past without much benefit.  I will prescribe either Nasacort or Flonase for her to use every night for the next few weeks as this will help with nasal congestion and sinus problems.  She will complete the remaining Augmentin.  Also discussed with her concerning use of saline irrigations which would be beneficial when the mucus is thick.  I gave her a sample of NeilMed Nettie pot. She will follow-up as needed.  Radene Journey, MD

## 2020-05-21 ENCOUNTER — Other Ambulatory Visit (INDEPENDENT_AMBULATORY_CARE_PROVIDER_SITE_OTHER): Payer: Self-pay | Admitting: Otolaryngology

## 2020-05-21 ENCOUNTER — Emergency Department (HOSPITAL_COMMUNITY)
Admission: EM | Admit: 2020-05-21 | Discharge: 2020-05-21 | Disposition: A | Discharge status: Home / Self Care | Payer: Medicare Other | Attending: Emergency Medicine | Admitting: Emergency Medicine

## 2020-05-21 ENCOUNTER — Other Ambulatory Visit: Payer: Self-pay

## 2020-05-21 ENCOUNTER — Emergency Department (HOSPITAL_COMMUNITY): Payer: Medicare Other

## 2020-05-21 DIAGNOSIS — R55 Syncope and collapse: Secondary | ICD-10-CM | POA: Insufficient documentation

## 2020-05-21 DIAGNOSIS — Z96643 Presence of artificial hip joint, bilateral: Secondary | ICD-10-CM | POA: Insufficient documentation

## 2020-05-21 DIAGNOSIS — Z79899 Other long term (current) drug therapy: Secondary | ICD-10-CM | POA: Diagnosis not present

## 2020-05-21 DIAGNOSIS — Z96652 Presence of left artificial knee joint: Secondary | ICD-10-CM | POA: Diagnosis not present

## 2020-05-21 DIAGNOSIS — J45909 Unspecified asthma, uncomplicated: Secondary | ICD-10-CM | POA: Insufficient documentation

## 2020-05-21 DIAGNOSIS — Z9049 Acquired absence of other specified parts of digestive tract: Secondary | ICD-10-CM | POA: Diagnosis not present

## 2020-05-21 DIAGNOSIS — R0902 Hypoxemia: Secondary | ICD-10-CM | POA: Diagnosis not present

## 2020-05-21 DIAGNOSIS — R109 Unspecified abdominal pain: Secondary | ICD-10-CM | POA: Insufficient documentation

## 2020-05-21 DIAGNOSIS — I10 Essential (primary) hypertension: Secondary | ICD-10-CM | POA: Diagnosis not present

## 2020-05-21 DIAGNOSIS — R011 Cardiac murmur, unspecified: Secondary | ICD-10-CM | POA: Insufficient documentation

## 2020-05-21 DIAGNOSIS — R112 Nausea with vomiting, unspecified: Secondary | ICD-10-CM | POA: Diagnosis not present

## 2020-05-21 DIAGNOSIS — R1110 Vomiting, unspecified: Secondary | ICD-10-CM | POA: Diagnosis not present

## 2020-05-21 DIAGNOSIS — I959 Hypotension, unspecified: Secondary | ICD-10-CM | POA: Diagnosis not present

## 2020-05-21 DIAGNOSIS — I700 Atherosclerosis of aorta: Secondary | ICD-10-CM | POA: Diagnosis not present

## 2020-05-21 LAB — CBC WITH DIFFERENTIAL/PLATELET
Abs Immature Granulocytes: 0.05 10*3/uL (ref 0.00–0.07)
Basophils Absolute: 0.1 10*3/uL (ref 0.0–0.1)
Basophils Relative: 0 %
Eosinophils Absolute: 0.1 10*3/uL (ref 0.0–0.5)
Eosinophils Relative: 1 %
HCT: 44.9 % (ref 36.0–46.0)
Hemoglobin: 14.1 g/dL (ref 12.0–15.0)
Immature Granulocytes: 0 %
Lymphocytes Relative: 4 %
Lymphs Abs: 0.7 10*3/uL (ref 0.7–4.0)
MCH: 28.7 pg (ref 26.0–34.0)
MCHC: 31.4 g/dL (ref 30.0–36.0)
MCV: 91.4 fL (ref 80.0–100.0)
Monocytes Absolute: 0.6 10*3/uL (ref 0.1–1.0)
Monocytes Relative: 4 %
Neutro Abs: 14.6 10*3/uL — ABNORMAL HIGH (ref 1.7–7.7)
Neutrophils Relative %: 91 %
Platelets: 218 10*3/uL (ref 150–400)
RBC: 4.91 MIL/uL (ref 3.87–5.11)
RDW: 13.2 % (ref 11.5–15.5)
WBC: 16 10*3/uL — ABNORMAL HIGH (ref 4.0–10.5)
nRBC: 0 % (ref 0.0–0.2)

## 2020-05-21 LAB — COMPREHENSIVE METABOLIC PANEL
ALT: 19 U/L (ref 0–44)
AST: 27 U/L (ref 15–41)
Albumin: 3.7 g/dL (ref 3.5–5.0)
Alkaline Phosphatase: 75 U/L (ref 38–126)
Anion gap: 8 (ref 5–15)
BUN: 11 mg/dL (ref 8–23)
CO2: 25 mmol/L (ref 22–32)
Calcium: 8.6 mg/dL — ABNORMAL LOW (ref 8.9–10.3)
Chloride: 109 mmol/L (ref 98–111)
Creatinine, Ser: 0.64 mg/dL (ref 0.44–1.00)
GFR, Estimated: >60 mL/min (ref >60)
Glucose, Bld: 123 mg/dL — ABNORMAL HIGH (ref 70–99)
Potassium: 4 mmol/L (ref 3.5–5.1)
Sodium: 142 mmol/L (ref 135–145)
Total Bilirubin: 0.7 mg/dL (ref 0.3–1.2)
Total Protein: 6.8 g/dL (ref 6.5–8.1)

## 2020-05-21 LAB — LIPASE, BLOOD: Lipase: 29 U/L (ref 11–51)

## 2020-05-21 LAB — URINALYSIS, ROUTINE W REFLEX MICROSCOPIC
Bilirubin Urine: NEGATIVE
Glucose, UA: NEGATIVE mg/dL
Hgb urine dipstick: NEGATIVE
Ketones, ur: NEGATIVE mg/dL
Leukocytes,Ua: NEGATIVE
Nitrite: NEGATIVE
Protein, ur: NEGATIVE mg/dL
Specific Gravity, Urine: 1.011 (ref 1.005–1.030)
pH: 7 (ref 5.0–8.0)

## 2020-05-21 MED ORDER — SODIUM CHLORIDE 0.9 % IV BOLUS
500.0000 mL | Freq: Once | INTRAVENOUS | Status: AC
  Administered 2020-05-21: 500 mL via INTRAVENOUS

## 2020-05-21 MED ORDER — AMOXICILLIN-POT CLAVULANATE 875-125 MG PO TABS: 1.0000 | ORAL_TABLET | Freq: Two times a day (BID) | ORAL | 0 refills | Status: DC

## 2020-05-21 MED ORDER — ONDANSETRON 4 MG PO TBDP: 4.0000 mg | ORAL_TABLET | Freq: Three times a day (TID) | ORAL | 0 refills | Status: DC | PRN

## 2020-05-21 NOTE — ED Triage Notes (Signed)
Pt arrives via EMS from home with complaints of N/V since 0700 today. Had a syncopal episode after vomiting.  Pt alert and oriented X4

## 2020-05-21 NOTE — ED Provider Notes (Signed)
[facility] Provider Note   CSN: [id number] Arrival date & time: 05/21/20  1204     History Chief Complaint  Patient presents with  . Loss of Consciousness    [name] is a 69 y.o. female presenting for evaluation nausea, vomiting, and syncope.  Patient states she woke up around 7:00 this morning, had a good bowel movement, and then immediately started develop nausea and vomiting.  She reports frequent emesis throughout the morning.  No blood in her emesis.  When her daughter got home, patient had what appears to have been a syncopal episode.  Patient denies other, chest pain, shortness of breath, cough, urinary symptoms.  She reports just before nausea, she has abdominal pain, none currently.  She was given Zofran with EMS, reports feeling much better.  She denies dizziness or lightheadedness currently.  No abdominal pain or nausea currently.  She reports previous history of appendectomy and hernia repair, no other abdominal surgeries.  Additional history obtained from chart review.  History of arthritis, asthma, bronchitis, GERD, hypertension, hyperlipidemia, obesity, herniated disc  HPI     Past Medical History:  Diagnosis Date  . Abnormal Pap smear of cervix 03/2009   Colpo Biopsy CIN I with HPV  . Arthritis   . Asthma   . Bilateral edema of lower extremity    Takes lasix if needed  . Bronchitis   . Disc disorder    bulging disc in thoracic area  . GERD (gastroesophageal reflux disease)    occ  . Hypercholesteremia   . Hypertension since 1990's  . Lumbar herniated disc   . Obesity   . Pneumonia    hx  . Scoliosis   . Spinal stenosis   . Teeth grinding     Patient Active Problem List   Diagnosis Date Noted  . Osteoarthritis of left knee 08/10/2015  . Status post total left knee replacement 08/10/2015  . Osteoarthritis of left hip 11/02/2014  . Status post total replacement of left hip 11/02/2014  . Osteoarthritis of  right hip 09/08/2014  . Status post total replacement of right hip 09/08/2014  . PMB (postmenopausal bleeding) 02/28/2013  . Morbid obesity ([place]) 02/28/2013  . Pure hypercholesterolemia 02/28/2013  . Essential hypertension, benign 02/28/2013    Past Surgical History:  Procedure Laterality Date  . APPENDECTOMY  1965  . BACK SURGERY  1993   Thoracic and Lumbar  . BREAST BIOPSY Right 1990   benign cyst  . BREAST EXCISIONAL BIOPSY Right 1990   benign  . [place] SURGERY  2007  . COLONOSCOPY W/ POLYPECTOMY    . [place], as child   bilateral inguinal -1 during pregnancy.  Umbilical repair as child  . KNEE ARTHROSCOPY Right 1990  . [place] SURGERY  2006  . SHOULDER ARTHROSCOPY Left 1994   arthritis, hips,fingers  . TONSILLECTOMY AND ADENOIDECTOMY    . TOTAL HIP ARTHROPLASTY Right 09/08/2014   Procedure: RIGHT TOTAL HIP ARTHROPLASTY ANTERIOR APPROACH;  Surgeon: Mcarthur Rossetti, MD;  Location: [facility];  Service: Orthopedics;  Laterality: Right;  . TOTAL HIP ARTHROPLASTY Left 11/02/2014   Procedure: LEFT TOTAL HIP ARTHROPLASTY ANTERIOR APPROACH;  Surgeon: Mcarthur Rossetti, MD;  Location: [facility];  Service: Orthopedics;  Laterality: Left;  . TOTAL KNEE ARTHROPLASTY Right 2009  . TOTAL KNEE ARTHROPLASTY Left 08/10/2015   Procedure: LEFT TOTAL KNEE ARTHROPLASTY;  Surgeon: Mcarthur Rossetti, MD;  Location: [facility];  Service: Orthopedics;  Laterality:  Left;     OB History    Gravida  1   Para  1   Term  1   Preterm  0   AB  0   Living  1     SAB  0   IAB  0   Ectopic  0   Multiple  0   Live Births  1           Family History  Problem Relation Age of Onset  . Breast cancer Sister 74       bilateral mastectomy- Breast cancer   . Hypertension Mother   . Heart disease Mother   . Stroke Mother   . Heart disease Father   . Depression Father   . Alcohol abuse Father   . Stroke Maternal Grandmother   . Emphysema Paternal Grandmother    . Breast cancer Maternal Aunt        Both aunts in 31 -9's  . Heart failure Paternal Grandfather   . Alcohol abuse Paternal Grandfather   . Depression Daughter     Social History   Tobacco Use  . Smoking status: Never Smoker  . Smokeless tobacco: Never Used  Vaping Use  . Vaping Use: Never used  Substance Use Topics  . Alcohol use: Not Currently    Comment: occasionally   . Drug use: No    Home Medications Prior to Admission medications   Medication Sig Start Date End Date Taking? Authorizing Provider  albuterol (ACCUNEB) 0.63 MG/3ML nebulizer solution Take 1 ampule by nebulization every 6 (six) hours as needed for wheezing or shortness of breath.  02/15/15   [provider]  albuterol (PROAIR HFA) 108 (90 Base) MCG/ACT inhaler Inhale 2 puffs into the lungs every 6 (six) hours as needed for wheezing or shortness of breath.    [provider]  amoxicillin-clavulanate (AUGMENTIN) 875-125 MG tablet Take 1 tablet by mouth every 12 (twelve) hours. 05/09/20   Arnaldo Natal, MD  cholecalciferol (VITAMIN D) 1000 UNITS tablet Take 1,000 Units by mouth 2 (two) times daily.    [provider]  Cinnamon 500 MG capsule Take 500 mg by mouth 2 (two) times daily.    [provider]  Coenzyme Q10 (COQ10) 200 MG CAPS Take 200 mg by mouth at bedtime.    [provider]  cyclobenzaprine (FLEXERIL) 10 MG tablet Take 1 tablet (10 mg total) by mouth 2 (two) times daily as needed for muscle spasms. 08/13/15   Mcarthur Rossetti, MD  diclofenac (VOLTAREN) 75 MG EC tablet Take 1 tablet (75 mg total) by mouth 2 (two) times daily as needed for mild pain. 11/05/14   Pete Pelt, PA-C  docusate sodium (COLACE) 100 MG capsule Take 200 mg by mouth at bedtime.    [provider]  felodipine (PLENDIL) 10 MG 24 hr tablet Take 10 mg by mouth daily.    [provider]  FLAXSEED, LINSEED, PO Take 1,300 mg by mouth daily.     [provider]   Garlic 5573 MG CAPS Take 1,000 mg by mouth daily.    [provider]  Grape Seed 50 MG TABS Take 50 mg by mouth daily.    [provider]  ipratropium-albuterol (DUONEB) 0.5-2.5 (3) MG/3ML SOLN Take 3 mLs by nebulization every 6 (six) hours as needed (shortness of breath).     [provider]  irbesartan (AVAPRO) 300 MG tablet Take 300 mg by mouth daily.  [provider]  labetalol (NORMODYNE) 200 MG tablet Take 400 mg by mouth 2 (two) times daily.     [provider]  lactobacillus acidophilus (BACID) TABS tablet Take 2 tablets by mouth every morning.    [provider]  Lecithin 1200 MG CAPS Take 1,200 mg by mouth daily.    [provider]  lidocaine-hydrocortisone (LIDAZONE HC) 3-0.5 % CREA Place 1 Applicatorful rectally as needed (hemorrhoids).     [provider]  Lutein 20 MG TABS Take 20 mg by mouth daily.     [provider]  Multiple Vitamin (MULTIVITAMIN WITH MINERALS) TABS tablet Take 0.5 tablets by mouth 2 (two) times daily.     [provider]  Omega 3 1200 MG CAPS Take 1,200 mg by mouth 2 (two) times daily.    [provider]  simvastatin (ZOCOR) 20 MG tablet Take 20 mg by mouth at bedtime.     [provider]  vitamin C (ASCORBIC ACID) 500 MG tablet Take 500 mg by mouth daily.    [provider]    Allergies    Etodolac and Adhesive [tape]  Review of Systems   Review of Systems  Gastrointestinal: Positive for abdominal pain, nausea and vomiting.  Neurological: Positive for syncope.  All other systems reviewed and are negative.   Physical Exam Updated Vital Signs BP (!) 157/79 (BP Location: Left Arm)   Pulse 71   Temp 98.2 F (36.8 C) (Oral)   Resp 12   Ht 5\' 2"  (1.575 m)   Wt 108.9 kg   LMP 11/15/2015 Comment: spotting 11-15-15  SpO2 94%   BMI 43.90 kg/m   Physical Exam Vitals and nursing note reviewed.  Constitutional:      General: She is  not in acute distress.    Appearance: She is well-developed. She is obese.     Comments: Resting in the bed in no acute distress  HENT:     Head: Normocephalic and atraumatic.  Eyes:     Conjunctiva/sclera: Conjunctivae normal.     Pupils: Pupils are equal, round, and reactive to light.  Cardiovascular:     Rate and Rhythm: Normal rate and regular rhythm.     Pulses: Normal pulses.     Heart sounds: Murmur heard.    Pulmonary:     Effort: Pulmonary effort is normal. No respiratory distress.     Breath sounds: Normal breath sounds. No wheezing.  Abdominal:     General: There is no distension.     Palpations: Abdomen is soft. There is no mass.     Tenderness: There is no abdominal tenderness. There is no right CVA tenderness, left CVA tenderness, guarding or rebound.     Comments: No TTP of the abdomen.  No rigidity, guarding, distention.  Negative rebound.  No peritonitis.  No CVA tenderness  Musculoskeletal:        General: Normal range of motion.     Cervical back: Normal range of motion and neck supple.  Skin:    General: Skin is warm and dry.     Capillary Refill: Capillary refill takes less than 2 seconds.  Neurological:     Mental Status: She is alert and oriented to person, place, and time.     ED Results / Procedures / Treatments   Labs (all labs ordered are listed, but only abnormal results are displayed) Labs Reviewed  CBC WITH DIFFERENTIAL/PLATELET - Abnormal; Notable for the following components:  Result Value   WBC 16.0 (*)    Neutro Abs 14.6 (*)    All other components within normal limits  URINALYSIS, ROUTINE W REFLEX MICROSCOPIC  COMPREHENSIVE METABOLIC PANEL  LIPASE, BLOOD    EKG None  Radiology No results found.  Procedures Procedures   Medications Ordered in ED Medications  sodium chloride 0.9 % bolus 500 mL (500 mLs Intravenous New Bag/Given 05/21/20 1253)    ED Course  I have reviewed the triage vital signs and the nursing  notes.  Pertinent labs & imaging results that were available during my care of the patient were reviewed by me and considered in my medical decision making (see chart for details).    MDM Rules/Calculators/A&P                          Patient resenting for evaluation nausea, vomiting, and syncope.  On exam, patient peers nontoxic.  Symptoms are mostly resolved.  Likely syncope secondary to vasovagal versus orthostatic cause.  However considering patient's age, will obtain labs, EKG, obtain orthostatic vital signs.  As patient has already received Zofran, and feels better, will hold off treatment.  Labs interpreted by me, shows leukocytosis of 16.  Otherwise labs are reassuring.  Urine without signs of infection. Orthostatics negative.  On further evaluation, patient remains nontoxic in appearance.  However she now has some mild tenderness palpation of the left side, specifically left lower quadrant abdomen.  In the setting of leukocytosis, abdominal pain, nausea, vomiting, will obtain CT abdomen pelvis to ensure no infection, obstruction, perforation.   CT abdomen pelvis negative for acute findings.  Case discussed with attending, Dr. Reather Converse evaluated the patient.  Patient is tolerating p.o. without difficulty, no nausea, vomiting, abdominal pain.  Discussed findings with patient and daughter.  Discussed continued symptomatic treatment at home, follow-up with PCP/GI.  At this time, patient appears safe for discharge.  Return precautions given.  Patient states she understands and agrees to plan.   Final Clinical Impression(s) / ED Diagnoses Final diagnoses:  None    Rx / DC Orders ED Discharge Orders    None       Franchot Heidelberg, PA-C 05/21/20 1654    Elnora Morrison, MD 05/22/20 1807

## 2020-05-21 NOTE — Discharge Instructions (Signed)
Continue taking home medications as prescribed. Use Zofran as needed for nausea or vomiting. Follow-up with your primary care doctor for further evaluation of your heart murmur. Follow-up with your GI doctor as needed for further evaluation of vomiting or appetite changes. Return to the emergency room if you develop high fevers, persistent vomiting, severe worsening pain, recurrent passing out, any new, worsening, or concerning symptoms.

## 2020-05-21 NOTE — ED Notes (Signed)
Pt ambulatory with steady gait to restroom. Pt ambulatory back to room, on monitors and verbalized comfort.

## 2020-05-21 NOTE — ED Notes (Signed)
Provided pt with water and crackers for fluid challange

## 2020-05-21 NOTE — ED Notes (Signed)
Pt tolerated PO challenge. PA aware.

## 2020-06-03 ENCOUNTER — Other Ambulatory Visit (HOSPITAL_COMMUNITY): Payer: Self-pay | Admitting: Family Medicine

## 2020-06-03 DIAGNOSIS — R011 Cardiac murmur, unspecified: Secondary | ICD-10-CM

## 2020-06-11 ENCOUNTER — Other Ambulatory Visit: Payer: Self-pay

## 2020-06-11 ENCOUNTER — Ambulatory Visit (HOSPITAL_COMMUNITY): Payer: Medicare Other | Attending: Cardiology

## 2020-06-11 DIAGNOSIS — R011 Cardiac murmur, unspecified: Secondary | ICD-10-CM | POA: Diagnosis not present

## 2020-06-11 DIAGNOSIS — R1110 Vomiting, unspecified: Secondary | ICD-10-CM | POA: Diagnosis not present

## 2020-06-11 DIAGNOSIS — K648 Other hemorrhoids: Secondary | ICD-10-CM | POA: Diagnosis not present

## 2020-06-11 LAB — ECHOCARDIOGRAM COMPLETE
Area-P 1/2: 3.48 cm2
S' Lateral: 3.5 cm

## 2020-06-16 DIAGNOSIS — J4530 Mild persistent asthma, uncomplicated: Secondary | ICD-10-CM | POA: Diagnosis not present

## 2020-06-16 DIAGNOSIS — R7301 Impaired fasting glucose: Secondary | ICD-10-CM | POA: Diagnosis not present

## 2020-06-16 DIAGNOSIS — F3340 Major depressive disorder, recurrent, in remission, unspecified: Secondary | ICD-10-CM | POA: Diagnosis not present

## 2020-06-16 DIAGNOSIS — Z6841 Body Mass Index (BMI) 40.0 and over, adult: Secondary | ICD-10-CM | POA: Diagnosis not present

## 2020-06-16 DIAGNOSIS — I10 Essential (primary) hypertension: Secondary | ICD-10-CM | POA: Diagnosis not present

## 2020-06-16 DIAGNOSIS — I700 Atherosclerosis of aorta: Secondary | ICD-10-CM | POA: Diagnosis not present

## 2020-06-16 DIAGNOSIS — E559 Vitamin D deficiency, unspecified: Secondary | ICD-10-CM | POA: Diagnosis not present

## 2020-06-16 DIAGNOSIS — Z0000 Encounter for general adult medical examination without abnormal findings: Secondary | ICD-10-CM | POA: Diagnosis not present

## 2020-06-16 DIAGNOSIS — E785 Hyperlipidemia, unspecified: Secondary | ICD-10-CM | POA: Diagnosis not present

## 2020-06-18 DIAGNOSIS — M26602 Left temporomandibular joint disorder, unspecified: Secondary | ICD-10-CM | POA: Diagnosis not present

## 2020-06-18 DIAGNOSIS — H6982 Other specified disorders of Eustachian tube, left ear: Secondary | ICD-10-CM | POA: Diagnosis not present

## 2020-06-18 DIAGNOSIS — L309 Dermatitis, unspecified: Secondary | ICD-10-CM | POA: Diagnosis not present

## 2020-06-18 DIAGNOSIS — H1013 Acute atopic conjunctivitis, bilateral: Secondary | ICD-10-CM | POA: Diagnosis not present

## 2020-06-29 DIAGNOSIS — Z23 Encounter for immunization: Secondary | ICD-10-CM | POA: Diagnosis not present

## 2020-07-06 ENCOUNTER — Ambulatory Visit
Admission: RE | Admit: 2020-07-06 | Discharge: 2020-07-06 | Disposition: A | Discharge status: Home / Self Care | Payer: Medicare Other | Source: Ambulatory Visit | Attending: Family Medicine | Admitting: Family Medicine

## 2020-07-06 ENCOUNTER — Other Ambulatory Visit: Payer: Self-pay

## 2020-07-06 ENCOUNTER — Other Ambulatory Visit: Payer: Self-pay | Admitting: Family Medicine

## 2020-07-06 DIAGNOSIS — Z1231 Encounter for screening mammogram for malignant neoplasm of breast: Secondary | ICD-10-CM | POA: Diagnosis not present

## 2020-07-06 DIAGNOSIS — R921 Mammographic calcification found on diagnostic imaging of breast: Secondary | ICD-10-CM

## 2020-07-09 DIAGNOSIS — H2513 Age-related nuclear cataract, bilateral: Secondary | ICD-10-CM | POA: Diagnosis not present

## 2020-07-12 ENCOUNTER — Other Ambulatory Visit: Payer: Self-pay | Admitting: Family Medicine

## 2020-07-12 DIAGNOSIS — E2839 Other primary ovarian failure: Secondary | ICD-10-CM

## 2020-07-23 DIAGNOSIS — U071 COVID-19: Secondary | ICD-10-CM | POA: Diagnosis not present

## 2020-07-30 DIAGNOSIS — R1110 Vomiting, unspecified: Secondary | ICD-10-CM | POA: Diagnosis not present

## 2020-07-30 DIAGNOSIS — K2930 Chronic superficial gastritis without bleeding: Secondary | ICD-10-CM | POA: Diagnosis not present

## 2020-07-30 DIAGNOSIS — K2940 Chronic atrophic gastritis without bleeding: Secondary | ICD-10-CM | POA: Diagnosis not present

## 2020-07-30 DIAGNOSIS — K2900 Acute gastritis without bleeding: Secondary | ICD-10-CM | POA: Diagnosis not present

## 2020-07-30 DIAGNOSIS — K31A19 Gastric intestinal metaplasia without dysplasia, unspecified site: Secondary | ICD-10-CM | POA: Diagnosis not present

## 2020-08-05 DIAGNOSIS — K31A19 Gastric intestinal metaplasia without dysplasia, unspecified site: Secondary | ICD-10-CM | POA: Diagnosis not present

## 2020-08-05 DIAGNOSIS — K2940 Chronic atrophic gastritis without bleeding: Secondary | ICD-10-CM | POA: Diagnosis not present

## 2020-10-03 DIAGNOSIS — Z23 Encounter for immunization: Secondary | ICD-10-CM | POA: Diagnosis not present

## 2020-10-18 DIAGNOSIS — D225 Melanocytic nevi of trunk: Secondary | ICD-10-CM | POA: Diagnosis not present

## 2020-10-18 DIAGNOSIS — L570 Actinic keratosis: Secondary | ICD-10-CM | POA: Diagnosis not present

## 2020-10-18 DIAGNOSIS — L821 Other seborrheic keratosis: Secondary | ICD-10-CM | POA: Diagnosis not present

## 2020-11-27 DIAGNOSIS — J40 Bronchitis, not specified as acute or chronic: Secondary | ICD-10-CM | POA: Diagnosis not present

## 2020-11-27 DIAGNOSIS — B349 Viral infection, unspecified: Secondary | ICD-10-CM | POA: Diagnosis not present

## 2020-11-27 DIAGNOSIS — J0120 Acute ethmoidal sinusitis, unspecified: Secondary | ICD-10-CM | POA: Diagnosis not present

## 2020-12-09 DIAGNOSIS — J209 Acute bronchitis, unspecified: Secondary | ICD-10-CM | POA: Diagnosis not present

## 2020-12-17 ENCOUNTER — Ambulatory Visit (INDEPENDENT_AMBULATORY_CARE_PROVIDER_SITE_OTHER): Payer: Medicare Other | Admitting: Obstetrics and Gynecology

## 2020-12-17 ENCOUNTER — Other Ambulatory Visit (HOSPITAL_COMMUNITY)
Admission: RE | Admit: 2020-12-17 | Discharge: 2020-12-17 | Disposition: A | Discharge status: Home / Self Care | Payer: Medicare Other | Source: Ambulatory Visit | Attending: Obstetrics and Gynecology | Admitting: Obstetrics and Gynecology

## 2020-12-17 ENCOUNTER — Other Ambulatory Visit: Payer: Self-pay

## 2020-12-17 ENCOUNTER — Encounter: Payer: Self-pay | Admitting: Obstetrics and Gynecology

## 2020-12-17 VITALS — BP 128/78 | HR 76

## 2020-12-17 DIAGNOSIS — N950 Postmenopausal bleeding: Secondary | ICD-10-CM | POA: Insufficient documentation

## 2020-12-17 DIAGNOSIS — Z1389 Encounter for screening for other disorder: Secondary | ICD-10-CM | POA: Diagnosis not present

## 2020-12-17 DIAGNOSIS — Z124 Encounter for screening for malignant neoplasm of cervix: Secondary | ICD-10-CM | POA: Diagnosis not present

## 2020-12-17 LAB — URINALYSIS, COMPLETE
Bacteria, UA: NONE SEEN /HPF
Bilirubin Urine: NEGATIVE
Glucose, UA: NEGATIVE
Hgb urine dipstick: NEGATIVE
Hyaline Cast: NONE SEEN /LPF
Ketones, ur: NEGATIVE
Leukocytes,Ua: NEGATIVE
Nitrite: NEGATIVE
Protein, ur: NEGATIVE
Specific Gravity, Urine: 1.01 (ref 1.001–1.035)
WBC, UA: NONE SEEN /HPF (ref 0–5)
pH: 7.5 (ref 5.0–8.0)

## 2020-12-17 NOTE — Progress Notes (Signed)
GYNECOLOGY  VISIT   HPI: 70 y.o.   Widowed White or Caucasian Not Hispanic or Latino  female   [id number] with Patient's last menstrual period was 11/15/2015.   here for PMB-one instance w/ evidence of blood on pad (very light). She went to the bathroom and noted a ring of pink blood around a clear spot. Didn't feel anything. Not sexually active, no straining.  She was evaluated for possible spotting 2 years ago and had a thin endometrial stripe. H/O vaginal polyp.  Last pap 03/28/17: negative.   No bowel or bladder changes. She has hemorrhoids, but the bleeding was definitely not from her rectum.   GYNECOLOGIC HISTORY: Patient's last menstrual period was 11/15/2015. Contraception: PMP Menopausal hormone therapy: none         OB History     Gravida  1   Para  1   Term  1   Preterm  0   AB  0   Living  1      SAB  0   IAB  0   Ectopic  0   Multiple  0   Live Births  1              Patient Active Problem List   Diagnosis Date Noted   Osteoarthritis of left knee 08/10/2015   Status post total left knee replacement 08/10/2015   Osteoarthritis of left hip 11/02/2014   Status post total replacement of left hip 11/02/2014   Osteoarthritis of right hip 09/08/2014   Status post total replacement of right hip 09/08/2014   PMB (postmenopausal bleeding) 02/28/2013   Morbid obesity ([place]) 02/28/2013   Pure hypercholesterolemia 02/28/2013   Essential hypertension, benign 02/28/2013    Past Medical History:  Diagnosis Date   Abnormal Pap smear of cervix 03/2009   Colpo Biopsy CIN I with HPV   Arthritis    Asthma    Bilateral edema of lower extremity    Takes lasix if needed   Bronchitis    Disc disorder    bulging disc in thoracic area   GERD (gastroesophageal reflux disease)    occ   Hypercholesteremia    Hypertension since 1990's   Lumbar herniated disc    Obesity    Pneumonia    hx   Scoliosis    Spinal stenosis    Teeth grinding     Past Surgical  History:  Procedure Laterality Date   [place]   Thoracic and Lumbar   BREAST BIOPSY Right 1990   benign cyst   BREAST EXCISIONAL BIOPSY Right 1990   benign   CERVICAL [place] SURGERY  2007   COLONOSCOPY W/ [place], as child   bilateral inguinal -1 during pregnancy.  Umbilical repair as child   KNEE ARTHROSCOPY Right 1990   LUMBAR [place] SURGERY  2006   SHOULDER ARTHROSCOPY Left 1994   arthritis, hips,fingers   TONSILLECTOMY AND ADENOIDECTOMY     TOTAL HIP ARTHROPLASTY Right 09/08/2014   Procedure: RIGHT TOTAL HIP ARTHROPLASTY ANTERIOR APPROACH;  Surgeon: Mcarthur Rossetti, MD;  Location: [facility];  Service: Orthopedics;  Laterality: Right;   TOTAL HIP ARTHROPLASTY Left 11/02/2014   Procedure: LEFT TOTAL HIP ARTHROPLASTY ANTERIOR APPROACH;  Surgeon: Mcarthur Rossetti, MD;  Location: [facility];  Service: Orthopedics;  Laterality: Left;   TOTAL KNEE ARTHROPLASTY Right 2009   TOTAL KNEE ARTHROPLASTY Left 08/10/2015   Procedure: LEFT TOTAL  KNEE ARTHROPLASTY;  Surgeon: Mcarthur Rossetti, MD;  Location: [facility];  Service: Orthopedics;  Laterality: Left;    Current Outpatient Medications  Medication Sig Dispense Refill   albuterol (ACCUNEB) 0.63 MG/3ML nebulizer solution Take 1 ampule by nebulization every 6 (six) hours as needed for wheezing or shortness of breath.      albuterol (VENTOLIN HFA) 108 (90 Base) MCG/ACT inhaler Inhale 2 puffs into the lungs every 6 (six) hours as needed for wheezing or shortness of breath.     Alpha Lipoic Acid 200 MG CAPS 1.5 tablet     Ascorbic Acid (VITAMIN C) 1000 MG tablet 1 tablet     aspirin 81 MG EC tablet 1 tablet     cholecalciferol (VITAMIN D) 1000 UNITS tablet Take 1,000 Units by mouth 2 (two) times daily.     cholecalciferol (VITAMIN D3) 25 MCG (1000 UNIT) tablet 1 tablet     Cinnamon 500 MG capsule Take 500 mg by mouth 2 (two) times daily.     Coenzyme Q10 (CO Q-10) 200 MG CAPS 1 capsule      cyclobenzaprine (FLEXERIL) 10 MG tablet Take 1 tablet (10 mg total) by mouth 2 (two) times daily as needed for muscle spasms. 30 tablet 0   diclofenac (VOLTAREN) 75 MG EC tablet Take 1 tablet (75 mg total) by mouth 2 (two) times daily as needed for mild pain. 60 tablet 1   docusate sodium (COLACE) 100 MG capsule Take 200 mg by mouth at bedtime.     felodipine (PLENDIL) 10 MG 24 hr tablet Take 10 mg by mouth daily.     FLAXSEED, LINSEED, PO Take 1,300 mg by mouth daily.      Garlic 9678 MG CAPS Take 1,000 mg by mouth daily.     Grape Seed 50 MG TABS Take 50 mg by mouth daily.     ipratropium-albuterol (DUONEB) 0.5-2.5 (3) MG/3ML SOLN Take 3 mLs by nebulization every 6 (six) hours as needed (shortness of breath).      irbesartan (AVAPRO) 300 MG tablet Take 300 mg by mouth daily.     labetalol (NORMODYNE) 200 MG tablet Take 400 mg by mouth 2 (two) times daily.      lactobacillus acidophilus (BACID) TABS tablet Take 2 tablets by mouth every morning.     Lecithin 1200 MG CAPS Take 1,200 mg by mouth daily.     lidocaine-hydrocortisone (ANAMANTEL HC) 3-0.5 % CREA Place 1 Applicatorful rectally as needed (hemorrhoids).      Lutein 20 MG TABS Take 20 mg by mouth daily.      meclizine (ANTIVERT) 25 MG tablet 1 tablet as needed     Multiple Vitamin (MULTIVITAMIN WITH MINERALS) TABS tablet Take 0.5 tablets by mouth 2 (two) times daily.      Omega 3 1200 MG CAPS Take 1,200 mg by mouth 2 (two) times daily.     ondansetron (ZOFRAN ODT) 4 MG disintegrating tablet Take 1 tablet (4 mg total) by mouth every 8 (eight) hours as needed for nausea or vomiting. 20 tablet 0   simvastatin (ZOCOR) 20 MG tablet Take 20 mg by mouth at bedtime.      vitamin C (ASCORBIC ACID) 500 MG tablet Take 500 mg by mouth daily.     No current facility-administered medications for this visit.     ALLERGIES: Etodolac and Adhesive [tape]  Family History  Problem Relation Age of Onset   Breast cancer Sister 42       bilateral  mastectomy- Breast  cancer    Hypertension Mother    Heart disease Mother    Stroke Mother    Heart disease Father    Depression Father    Alcohol abuse Father    Stroke Maternal Grandmother    Emphysema Paternal Grandmother    Breast cancer Maternal Aunt        Both aunts in 19 -82's   Heart failure Paternal Grandfather    Alcohol abuse Paternal Grandfather    Depression Daughter     Social History   Socioeconomic History   Marital status: Widowed    Spouse name: Not on file   Number of children: Not on file   Years of education: Not on file   Highest education level: Not on file  Occupational History   Not on file  Tobacco Use   Smoking status: Never   Smokeless tobacco: Never  Vaping Use   Vaping Use: Never used  Substance and Sexual Activity   Alcohol use: Not Currently    Comment: occasionally    Drug use: No   Sexual activity: Not Currently    Partners: Male    Birth control/protection: Post-menopausal  Other Topics Concern   Not on file  Social History Narrative   Husband age 20 died 24-Mar-2013.   Social Determinants of Health   Financial Resource Strain: Not on file  Food Insecurity: Not on file  Transportation Needs: Not on file  Physical Activity: Not on file  Stress: Not on file  Social Connections: Not on file  Intimate Partner Violence: Not on file    ROS  PHYSICAL EXAMINATION:    BP 128/78    Pulse 76    LMP 11/15/2015 Comment: spotting 11-15-15   SpO2 96%     General appearance: alert, cooperative and appears stated age Abdomen: soft, non-tender; non distended, no masses,  no organomegaly  Pelvic: External genitalia:  no lesions              Urethra:  normal appearing urethra with no masses, tenderness or lesions              Bartholins and Skenes: normal                 Vagina: mildly atrophic appearing vagina, no discharge, ~1 cm vaginal polyp on the posterior vaginal wall. Not friable              Cervix: no lesions and no ectropion,  mildly friable with pap              Bimanual Exam:  Uterus:   no masses or tenderness              Adnexa: no mass, fullness, tenderness                Chaperone was present for exam.  1. Postmenopausal bleeding - US PELVIS TRANSVAGINAL NON-OB (TV ONLY); Future - Cytology - PAP  2. Screening for hematuria or proteinuria Want to r/o hematuria as her source of bleeding - Urinalysis, Complete  3. Screening for cervical cancer - Cytology - PAP

## 2020-12-21 ENCOUNTER — Ambulatory Visit (INDEPENDENT_AMBULATORY_CARE_PROVIDER_SITE_OTHER): Payer: Medicare Other | Admitting: Obstetrics and Gynecology

## 2020-12-21 ENCOUNTER — Other Ambulatory Visit: Payer: Self-pay

## 2020-12-21 ENCOUNTER — Ambulatory Visit (INDEPENDENT_AMBULATORY_CARE_PROVIDER_SITE_OTHER): Payer: Medicare Other

## 2020-12-21 ENCOUNTER — Encounter: Payer: Self-pay | Admitting: Obstetrics and Gynecology

## 2020-12-21 VITALS — BP 132/78 | HR 72 | Wt 220.0 lb

## 2020-12-21 DIAGNOSIS — N950 Postmenopausal bleeding: Secondary | ICD-10-CM

## 2020-12-21 NOTE — Progress Notes (Signed)
GYNECOLOGY  VISIT   HPI: 70 y.o.   Widowed White or Caucasian Not Hispanic or Latino  female   [id number] with Patient's last menstrual period was 11/15/2015.   here for ultrasound consult following post menopausal bleeding. She had one episode of noting minimal spotting on her pad. No further spotting. It was a ring of pink around a clear spot. Urinalysis with 0-2 RBC/HPF. Pap is pending.   GYNECOLOGIC HISTORY: Patient's last menstrual period was 11/15/2015. Contraception:pmp Menopausal hormone therapy: none         OB History     Gravida  1   Para  1   Term  1   Preterm  0   AB  0   Living  1      SAB  0   IAB  0   Ectopic  0   Multiple  0   Live Births  1              Patient Active Problem List   Diagnosis Date Noted   Osteoarthritis of left knee 08/10/2015   Status post total left knee replacement 08/10/2015   Osteoarthritis of left hip 11/02/2014   Status post total replacement of left hip 11/02/2014   Osteoarthritis of right hip 09/08/2014   Status post total replacement of right hip 09/08/2014   PMB (postmenopausal bleeding) 02/28/2013   Morbid obesity ([place]) 02/28/2013   Pure hypercholesterolemia 02/28/2013   Essential hypertension, benign 02/28/2013    Past Medical History:  Diagnosis Date   Abnormal Pap smear of cervix 03/2009   [place] Biopsy CIN I with HPV   Arthritis    Asthma    Bilateral edema of lower extremity    Takes lasix if needed   Bronchitis    Disc disorder    bulging disc in thoracic area   GERD (gastroesophageal reflux disease)    occ   Hypercholesteremia    Hypertension since 1990's   Lumbar herniated disc    Obesity    Pneumonia    hx   Scoliosis    Spinal stenosis    Teeth grinding     Past Surgical History:  Procedure Laterality Date   [place]   Thoracic and Lumbar   BREAST BIOPSY Right 1990   benign cyst   BREAST EXCISIONAL BIOPSY Right 1990   benign   CERVICAL [place]  SURGERY  2007   COLONOSCOPY W/ [place], as child   bilateral inguinal -1 during pregnancy.  Umbilical repair as child   KNEE ARTHROSCOPY Right 1990   LUMBAR [place] SURGERY  2006   SHOULDER ARTHROSCOPY Left 1994   arthritis, hips,fingers   TONSILLECTOMY AND ADENOIDECTOMY     TOTAL HIP ARTHROPLASTY Right 09/08/2014   Procedure: RIGHT TOTAL HIP ARTHROPLASTY ANTERIOR APPROACH;  Surgeon: Mcarthur Rossetti, MD;  Location: [facility];  Service: Orthopedics;  Laterality: Right;   TOTAL HIP ARTHROPLASTY Left 11/02/2014   Procedure: LEFT TOTAL HIP ARTHROPLASTY ANTERIOR APPROACH;  Surgeon: Mcarthur Rossetti, MD;  Location: [facility];  Service: Orthopedics;  Laterality: Left;   TOTAL KNEE ARTHROPLASTY Right 2009   TOTAL KNEE ARTHROPLASTY Left 08/10/2015   Procedure: LEFT TOTAL KNEE ARTHROPLASTY;  Surgeon: Mcarthur Rossetti, MD;  Location: [facility];  Service: Orthopedics;  Laterality: Left;    Current Outpatient Medications  Medication Sig Dispense Refill   albuterol (ACCUNEB) 0.63 MG/3ML nebulizer solution Take 1 ampule by  nebulization every 6 (six) hours as needed for wheezing or shortness of breath.      albuterol (VENTOLIN HFA) 108 (90 Base) MCG/ACT inhaler Inhale 2 puffs into the lungs every 6 (six) hours as needed for wheezing or shortness of breath.     Alpha Lipoic Acid 200 MG CAPS 1.5 tablet     Ascorbic Acid (VITAMIN C) 1000 MG tablet 1 tablet     aspirin 81 MG EC tablet 1 tablet     cholecalciferol (VITAMIN D) 1000 UNITS tablet Take 1,000 Units by mouth 2 (two) times daily.     cholecalciferol (VITAMIN D3) 25 MCG (1000 UNIT) tablet 1 tablet     Cinnamon 500 MG capsule Take 500 mg by mouth 2 (two) times daily.     Coenzyme Q10 (CO Q-10) 200 MG CAPS 1 capsule     cyclobenzaprine (FLEXERIL) 10 MG tablet Take 1 tablet (10 mg total) by mouth 2 (two) times daily as needed for muscle spasms. 30 tablet 0   diclofenac (VOLTAREN) 75 MG EC tablet Take 1 tablet (75 mg total)  by mouth 2 (two) times daily as needed for mild pain. 60 tablet 1   docusate sodium (COLACE) 100 MG capsule Take 200 mg by mouth at bedtime.     felodipine (PLENDIL) 10 MG 24 hr tablet Take 10 mg by mouth daily.     FLAXSEED, LINSEED, PO Take 1,300 mg by mouth daily.      Garlic 3329 MG CAPS Take 1,000 mg by mouth daily.     Grape Seed 50 MG TABS Take 50 mg by mouth daily.     ipratropium-albuterol (DUONEB) 0.5-2.5 (3) MG/3ML SOLN Take 3 mLs by nebulization every 6 (six) hours as needed (shortness of breath).      irbesartan (AVAPRO) 300 MG tablet Take 300 mg by mouth daily.     labetalol (NORMODYNE) 200 MG tablet Take 400 mg by mouth 2 (two) times daily.      lactobacillus acidophilus (BACID) TABS tablet Take 2 tablets by mouth every morning.     Lecithin 1200 MG CAPS Take 1,200 mg by mouth daily.     lidocaine-hydrocortisone (ANAMANTEL HC) 3-0.5 % CREA Place 1 Applicatorful rectally as needed (hemorrhoids).      Lutein 20 MG TABS Take 20 mg by mouth daily.      meclizine (ANTIVERT) 25 MG tablet 1 tablet as needed     Multiple Vitamin (MULTIVITAMIN WITH MINERALS) TABS tablet Take 0.5 tablets by mouth 2 (two) times daily.      Omega 3 1200 MG CAPS Take 1,200 mg by mouth 2 (two) times daily.     ondansetron (ZOFRAN ODT) 4 MG disintegrating tablet Take 1 tablet (4 mg total) by mouth every 8 (eight) hours as needed for nausea or vomiting. 20 tablet 0   simvastatin (ZOCOR) 20 MG tablet Take 20 mg by mouth at bedtime.      vitamin C (ASCORBIC ACID) 500 MG tablet Take 500 mg by mouth daily.     No current facility-administered medications for this visit.     ALLERGIES: Etodolac and Adhesive [tape]  Family History  Problem Relation Age of Onset   Breast cancer Sister 23       bilateral mastectomy- Breast cancer    Hypertension Mother    Heart disease Mother    Stroke Mother    Heart disease Father    Depression Father    Alcohol abuse Father    Stroke Maternal Grandmother  Emphysema  Paternal Grandmother    Breast cancer Maternal Aunt        Both aunts in 41 -5's   Heart failure Paternal Grandfather    Alcohol abuse Paternal Grandfather    Depression Daughter     Social History   Socioeconomic History   Marital status: Widowed    Spouse name: Not on file   Number of children: Not on file   Years of education: Not on file   Highest education level: Not on file  Occupational History   Not on file  Tobacco Use   Smoking status: Never   Smokeless tobacco: Never  Vaping Use   Vaping Use: Never used  Substance and Sexual Activity   Alcohol use: Not Currently    Comment: occasionally    Drug use: No   Sexual activity: Not Currently    Partners: Male    Birth control/protection: Post-menopausal  Other Topics Concern   Not on file  Social History Narrative   Husband age 71 died 2013/03/20.   Social Determinants of Health   Financial Resource Strain: Not on file  Food Insecurity: Not on file  Transportation Needs: Not on file  Physical Activity: Not on file  Stress: Not on file  Social Connections: Not on file  Intimate Partner Violence: Not on file    ROS  PHYSICAL EXAMINATION:    LMP 11/15/2015 Comment: spotting 11-15-15    General appearance: alert, cooperative and appears stated age  Pelvic ultrasound  Indications: postmenopausal spotting  Findings:  Uterus 5.10 x 4.35 x 3.01 cm, anteverted  Endometrium 2.36 mm, thin, symmetric  Left ovary not seen   Right ovary not seen  Trace free fluid  Impression:  Small anteverted uterus Thin, symmetrical endometrium Ovaries not seen, no adnexal masses  1. Postmenopausal bleeding Normal ultrasound, thin uniform endometrium. Will call with any further bleeding, would further evaluate with sonohysterogram and possible biopsy.

## 2020-12-22 LAB — CYTOLOGY - PAP: Diagnosis: NEGATIVE

## 2021-01-14 ENCOUNTER — Other Ambulatory Visit: Payer: Self-pay

## 2021-01-14 ENCOUNTER — Ambulatory Visit
Admission: RE | Admit: 2021-01-14 | Discharge: 2021-01-14 | Disposition: A | Discharge status: Home / Self Care | Payer: Medicare Other | Source: Ambulatory Visit | Attending: Family Medicine | Admitting: Family Medicine

## 2021-01-14 DIAGNOSIS — Z780 Asymptomatic menopausal state: Secondary | ICD-10-CM | POA: Diagnosis not present

## 2021-01-14 DIAGNOSIS — E2839 Other primary ovarian failure: Secondary | ICD-10-CM

## 2021-01-17 DIAGNOSIS — Z23 Encounter for immunization: Secondary | ICD-10-CM | POA: Diagnosis not present

## 2021-01-24 DIAGNOSIS — C44319 Basal cell carcinoma of skin of other parts of face: Secondary | ICD-10-CM | POA: Diagnosis not present

## 2021-01-24 DIAGNOSIS — D2262 Melanocytic nevi of left upper limb, including shoulder: Secondary | ICD-10-CM | POA: Diagnosis not present

## 2021-01-24 DIAGNOSIS — L814 Other melanin hyperpigmentation: Secondary | ICD-10-CM | POA: Diagnosis not present

## 2021-01-24 DIAGNOSIS — D225 Melanocytic nevi of trunk: Secondary | ICD-10-CM | POA: Diagnosis not present

## 2021-01-24 DIAGNOSIS — D692 Other nonthrombocytopenic purpura: Secondary | ICD-10-CM | POA: Diagnosis not present

## 2021-01-24 DIAGNOSIS — L738 Other specified follicular disorders: Secondary | ICD-10-CM | POA: Diagnosis not present

## 2021-01-24 DIAGNOSIS — D485 Neoplasm of uncertain behavior of skin: Secondary | ICD-10-CM | POA: Diagnosis not present

## 2021-01-24 DIAGNOSIS — L821 Other seborrheic keratosis: Secondary | ICD-10-CM | POA: Diagnosis not present

## 2021-01-24 DIAGNOSIS — D2261 Melanocytic nevi of right upper limb, including shoulder: Secondary | ICD-10-CM | POA: Diagnosis not present

## 2021-01-24 DIAGNOSIS — L720 Epidermal cyst: Secondary | ICD-10-CM | POA: Diagnosis not present

## 2021-01-24 DIAGNOSIS — D2239 Melanocytic nevi of other parts of face: Secondary | ICD-10-CM | POA: Diagnosis not present

## 2021-01-24 DIAGNOSIS — L570 Actinic keratosis: Secondary | ICD-10-CM | POA: Diagnosis not present

## 2021-01-31 DIAGNOSIS — C44319 Basal cell carcinoma of skin of other parts of face: Secondary | ICD-10-CM | POA: Diagnosis not present

## 2021-02-28 ENCOUNTER — Other Ambulatory Visit: Payer: Self-pay | Admitting: Family Medicine

## 2021-02-28 DIAGNOSIS — Z1231 Encounter for screening mammogram for malignant neoplasm of breast: Secondary | ICD-10-CM

## 2021-03-07 DIAGNOSIS — J343 Hypertrophy of nasal turbinates: Secondary | ICD-10-CM | POA: Diagnosis not present

## 2021-03-07 DIAGNOSIS — J324 Chronic pansinusitis: Secondary | ICD-10-CM | POA: Diagnosis not present

## 2021-03-07 DIAGNOSIS — J342 Deviated nasal septum: Secondary | ICD-10-CM | POA: Diagnosis not present

## 2021-03-08 ENCOUNTER — Other Ambulatory Visit: Payer: Self-pay | Admitting: Otolaryngology

## 2021-03-08 DIAGNOSIS — R059 Cough, unspecified: Secondary | ICD-10-CM | POA: Diagnosis not present

## 2021-03-08 DIAGNOSIS — R509 Fever, unspecified: Secondary | ICD-10-CM | POA: Diagnosis not present

## 2021-03-08 DIAGNOSIS — J329 Chronic sinusitis, unspecified: Secondary | ICD-10-CM

## 2021-03-08 DIAGNOSIS — R0981 Nasal congestion: Secondary | ICD-10-CM | POA: Diagnosis not present

## 2021-03-08 DIAGNOSIS — Z03818 Encounter for observation for suspected exposure to other biological agents ruled out: Secondary | ICD-10-CM | POA: Diagnosis not present

## 2021-03-09 DIAGNOSIS — J40 Bronchitis, not specified as acute or chronic: Secondary | ICD-10-CM | POA: Diagnosis not present

## 2021-03-09 DIAGNOSIS — J45901 Unspecified asthma with (acute) exacerbation: Secondary | ICD-10-CM | POA: Diagnosis not present

## 2021-03-13 ENCOUNTER — Other Ambulatory Visit: Payer: Self-pay

## 2021-03-13 ENCOUNTER — Observation Stay (HOSPITAL_COMMUNITY)
Admission: EM | Admit: 2021-03-13 | Discharge: 2021-03-14 | Disposition: A | Discharge status: Home / Self Care | Payer: Medicare Other | Attending: Family Medicine | Admitting: Family Medicine

## 2021-03-13 ENCOUNTER — Encounter (HOSPITAL_COMMUNITY): Payer: Self-pay

## 2021-03-13 ENCOUNTER — Emergency Department (HOSPITAL_COMMUNITY): Payer: Medicare Other

## 2021-03-13 DIAGNOSIS — J45901 Unspecified asthma with (acute) exacerbation: Secondary | ICD-10-CM | POA: Diagnosis present

## 2021-03-13 DIAGNOSIS — R0602 Shortness of breath: Secondary | ICD-10-CM | POA: Diagnosis not present

## 2021-03-13 DIAGNOSIS — Z20822 Contact with and (suspected) exposure to covid-19: Secondary | ICD-10-CM | POA: Diagnosis not present

## 2021-03-13 DIAGNOSIS — E785 Hyperlipidemia, unspecified: Secondary | ICD-10-CM | POA: Diagnosis not present

## 2021-03-13 DIAGNOSIS — I10 Essential (primary) hypertension: Secondary | ICD-10-CM | POA: Diagnosis present

## 2021-03-13 DIAGNOSIS — Z7951 Long term (current) use of inhaled steroids: Secondary | ICD-10-CM | POA: Insufficient documentation

## 2021-03-13 DIAGNOSIS — Z7982 Long term (current) use of aspirin: Secondary | ICD-10-CM | POA: Diagnosis not present

## 2021-03-13 DIAGNOSIS — J4541 Moderate persistent asthma with (acute) exacerbation: Principal | ICD-10-CM

## 2021-03-13 LAB — CBC WITH DIFFERENTIAL/PLATELET
Abs Immature Granulocytes: 0.1 10*3/uL — ABNORMAL HIGH (ref 0.00–0.07)
Basophils Absolute: 0 10*3/uL (ref 0.0–0.1)
Basophils Relative: 0 %
Eosinophils Absolute: 0 10*3/uL (ref 0.0–0.5)
Eosinophils Relative: 0 %
HCT: 45.6 % (ref 36.0–46.0)
Hemoglobin: 14.5 g/dL (ref 12.0–15.0)
Immature Granulocytes: 1 %
Lymphocytes Relative: 23 %
Lymphs Abs: 2 10*3/uL (ref 0.7–4.0)
MCH: 29.4 pg (ref 26.0–34.0)
MCHC: 31.8 g/dL (ref 30.0–36.0)
MCV: 92.3 fL (ref 80.0–100.0)
Monocytes Absolute: 0.4 10*3/uL (ref 0.1–1.0)
Monocytes Relative: 5 %
Neutro Abs: 6.2 10*3/uL (ref 1.7–7.7)
Neutrophils Relative %: 71 %
Platelets: 213 10*3/uL (ref 150–400)
RBC: 4.94 MIL/uL (ref 3.87–5.11)
RDW: 12.8 % (ref 11.5–15.5)
WBC: 8.8 10*3/uL (ref 4.0–10.5)
nRBC: 0 % (ref 0.0–0.2)

## 2021-03-13 LAB — COMPREHENSIVE METABOLIC PANEL
ALT: 21 U/L (ref 0–44)
AST: 21 U/L (ref 15–41)
Albumin: 3.6 g/dL (ref 3.5–5.0)
Alkaline Phosphatase: 74 U/L (ref 38–126)
Anion gap: 9 (ref 5–15)
BUN: 15 mg/dL (ref 8–23)
CO2: 28 mmol/L (ref 22–32)
Calcium: 8.8 mg/dL — ABNORMAL LOW (ref 8.9–10.3)
Chloride: 103 mmol/L (ref 98–111)
Creatinine, Ser: 0.69 mg/dL (ref 0.44–1.00)
GFR, Estimated: >60 mL/min (ref >60)
Glucose, Bld: 136 mg/dL — ABNORMAL HIGH (ref 70–99)
Potassium: 4.1 mmol/L (ref 3.5–5.1)
Sodium: 140 mmol/L (ref 135–145)
Total Bilirubin: 0.5 mg/dL (ref 0.3–1.2)
Total Protein: 6.3 g/dL — ABNORMAL LOW (ref 6.5–8.1)

## 2021-03-13 MED ORDER — ALBUTEROL SULFATE HFA 108 (90 BASE) MCG/ACT IN AERS: 2.0000 | INHALATION_SPRAY | RESPIRATORY_TRACT | Status: DC | PRN

## 2021-03-13 MED ORDER — ALBUTEROL SULFATE (2.5 MG/3ML) 0.083% IN NEBU
10.0000 mg | INHALATION_SOLUTION | RESPIRATORY_TRACT | Status: DC
  Administered 2021-03-13: 10 mg via RESPIRATORY_TRACT
  Filled 2021-03-13 (×2): qty 12

## 2021-03-13 MED ORDER — ALBUTEROL SULFATE (2.5 MG/3ML) 0.083% IN NEBU: 2.5000 mg | INHALATION_SOLUTION | Freq: Once | RESPIRATORY_TRACT | Status: DC

## 2021-03-13 MED ORDER — MAGNESIUM SULFATE 2 GM/50ML IV SOLN
2.0000 g | Freq: Once | INTRAVENOUS | Status: AC
  Administered 2021-03-13: 2 g via INTRAVENOUS
  Filled 2021-03-13: qty 50

## 2021-03-13 NOTE — ED Provider Notes (Cosign Needed)
[facility] EMERGENCY DEPARTMENT Provider Note   CSN: [id number] Arrival date & time: 03/13/21  1905     History  Chief Complaint  Patient presents with   Cough   Shortness of Breath    [name] is a 71 y.o. female.  71 y.o female with a past medical history of asthma presents to the ED with sinus pressure, productive cough, exacerbation of her asthma x1 week.  Patient was previously evaluated on Tuesday, placed on antibiotics Augmentin prednisone, cough syrup without any improvement in her symptoms.  On today's visit, she reports her oxygen saturation at home was around 88% on room air, reports she had a hard time bringing "bringing this up ".  She reports exacerbation of the shortness of breath with any type of activity, reporting she feels very winded with moving from one place to another.  She usually obtains relief with her inhaler, nebulizer, however has tried both of these without much improvement.  He did have multiple COVID-19 test 1 at home, 1 done in office which were negative.  Denies any prior history of tobacco use.  Denies any fever, chest pain, leg swelling.  The history is provided by the patient and medical records.  Cough Associated symptoms: shortness of breath and wheezing   Associated symptoms: no chest pain, no chills, no fever, no headaches and no sore throat   Shortness of Breath Associated symptoms: cough and wheezing   Associated symptoms: no abdominal pain, no chest pain, no fever, no headaches, no sore throat and no vomiting       Home Medications Prior to Admission medications   Medication Sig Start Date End Date Taking? Authorizing Provider  albuterol (ACCUNEB) 0.63 MG/3ML nebulizer solution Take 1 ampule by nebulization every 6 (six) hours as needed for wheezing or shortness of breath.  02/15/15   [provider]  albuterol (VENTOLIN HFA) 108 (90 Base) MCG/ACT inhaler Inhale 2 puffs into the lungs every 6 (six) hours as  needed for wheezing or shortness of breath.    [provider]  Alpha Lipoic Acid 200 MG CAPS 1.5 tablet    [provider]  Ascorbic Acid (VITAMIN C) 1000 MG tablet 1 tablet    [provider]  aspirin 81 MG EC tablet 1 tablet    [provider]  cholecalciferol (VITAMIN D) 1000 UNITS tablet Take 1,000 Units by mouth 2 (two) times daily.    [provider]  cholecalciferol (VITAMIN D3) 25 MCG (1000 UNIT) tablet 1 tablet    [provider]  Cinnamon 500 MG capsule Take 500 mg by mouth 2 (two) times daily.    [provider]  Coenzyme Q10 (CO Q-10) 200 MG CAPS 1 capsule    [provider]  cyclobenzaprine (FLEXERIL) 10 MG tablet Take 1 tablet (10 mg total) by mouth 2 (two) times daily as needed for muscle spasms. 08/13/15   Mcarthur Rossetti, MD  diclofenac (VOLTAREN) 75 MG EC tablet Take 1 tablet (75 mg total) by mouth 2 (two) times daily as needed for mild pain. 11/05/14   Pete Pelt, PA-C  docusate sodium (COLACE) 100 MG capsule Take 200 mg by mouth at bedtime.    [provider]  felodipine (PLENDIL) 10 MG 24 hr tablet Take 10 mg by mouth daily.    [provider]  FLAXSEED, LINSEED, PO Take 1,300 mg by mouth daily.     [provider]  Garlic 6659 MG CAPS Take  1,000 mg by mouth daily.    [provider]  Grape Seed 50 MG TABS Take 50 mg by mouth daily.    [provider]  ipratropium-albuterol (DUONEB) 0.5-2.5 (3) MG/3ML SOLN Take 3 mLs by nebulization every 6 (six) hours as needed (shortness of breath).     [provider]  irbesartan (AVAPRO) 300 MG tablet Take 300 mg by mouth daily.    [provider]  labetalol (NORMODYNE) 200 MG tablet Take 400 mg by mouth 2 (two) times daily.     [provider]  lactobacillus acidophilus (BACID) TABS tablet Take 2 tablets by mouth every morning.    [provider]  Lecithin 1200 MG CAPS Take  1,200 mg by mouth daily.    [provider]  lidocaine-hydrocortisone (ANAMANTEL HC) 3-0.5 % CREA Place 1 Applicatorful rectally as needed (hemorrhoids).     [provider]  Lutein 20 MG TABS Take 20 mg by mouth daily.     [provider]  meclizine (ANTIVERT) 25 MG tablet 1 tablet as needed    [provider]  Multiple Vitamin (MULTIVITAMIN WITH MINERALS) TABS tablet Take 0.5 tablets by mouth 2 (two) times daily.     [provider]  Omega 3 1200 MG CAPS Take 1,200 mg by mouth 2 (two) times daily.    [provider]  ondansetron (ZOFRAN ODT) 4 MG disintegrating tablet Take 1 tablet (4 mg total) by mouth every 8 (eight) hours as needed for nausea or vomiting. 05/21/20   Caccavale, Sophia, PA-C  simvastatin (ZOCOR) 20 MG tablet Take 20 mg by mouth at bedtime.     [provider]  vitamin C (ASCORBIC ACID) 500 MG tablet Take 500 mg by mouth daily.    [provider]      Allergies    Etodolac and Adhesive [tape]    Review of Systems   Review of Systems  Constitutional:  Negative for chills and fever.  HENT:  Negative for sore throat.   Respiratory:  Positive for cough, shortness of breath and wheezing.   Cardiovascular:  Negative for chest pain.  Gastrointestinal:  Negative for abdominal pain, nausea and vomiting.  Genitourinary:  Negative for flank pain.  Musculoskeletal:  Negative for back pain.  Neurological:  Negative for light-headedness and headaches.  All other systems reviewed and are negative.  Physical Exam Updated Vital Signs BP (!) 150/73    Pulse (!) 55    Temp 98.9 F (37.2 C)    Resp 12    Ht '5\' 2"'$  (1.575 m)    Wt 101.2 kg    LMP 11/15/2015 Comment: spotting 11-15-15   SpO2 100%    BMI 40.79 kg/m  Physical Exam Vitals and nursing note reviewed.  Constitutional:      Appearance: She is well-developed.  HENT:     Head: Normocephalic and atraumatic.  Cardiovascular:     Rate and Rhythm: Normal  rate.  Pulmonary:     Effort: Pulmonary effort is normal.     Breath sounds: Examination of the right-upper field reveals wheezing. Examination of the right-middle field reveals wheezing. Examination of the right-lower field reveals wheezing. Examination of the left-lower field reveals wheezing. Wheezing present.  Abdominal:     Palpations: Abdomen is soft.  Musculoskeletal:     Cervical back: Normal range of motion and neck supple.     Right lower leg: No tenderness. No edema.     Left lower leg: No tenderness. No edema.  Skin:    General: Skin is warm and dry.  Neurological:     Mental Status: She is alert and oriented to person, place, and time.    ED Results / Procedures / Treatments   Labs (all labs ordered are listed, but only abnormal results are displayed) Labs Reviewed  CBC WITH DIFFERENTIAL/PLATELET - Abnormal; Notable for the following components:      Result Value   Abs Immature Granulocytes 0.10 (*)    All other components within normal limits  COMPREHENSIVE METABOLIC PANEL - Abnormal; Notable for the following components:   Glucose, Bld 136 (*)    Calcium 8.8 (*)    Total Protein 6.3 (*)    All other components within normal limits    EKG EKG Interpretation  Date/Time:  Sunday March 13 2021 23:39:27 EDT Ventricular Rate:  65 PR Interval:  177 QRS Duration: 92 QT Interval:  426 QTC Calculation: 443 R Axis:   79 Text Interpretation: Sinus rhythm Anterior infarct, old Confirmed by Dorie Rank [phone]) on 03/13/2021 11:42:01 PM  Radiology DG Chest 2 View  Result Date: 03/13/2021 CLINICAL DATA:  Shortness of breath EXAM: CHEST - 2 VIEW COMPARISON:  03/09/2020 FINDINGS: Heart and mediastinal contours are within normal limits. No focal opacities or effusions. No acute bony abnormality. IMPRESSION: No active cardiopulmonary disease. Electronically Signed   By: Rolm Baptise M.D.   On: 03/13/2021 19:48    Procedures Procedures    Medications Ordered in  ED Medications  albuterol (PROVENTIL) (2.5 MG/3ML) 0.083% nebulizer solution 10 mg (10 mg Nebulization New Bag/Given 03/13/21 2213)  magnesium sulfate IVPB 2 g 50 mL (0 g Intravenous Stopped 03/13/21 2332)    ED Course/ Medical Decision Making/ A&P                           Medical Decision Making Amount and/or Complexity of Data Reviewed Labs: ordered. Radiology: ordered.  Risk Prescription drug management.   This patient presents to the ED for concern of shortness of breath, this involves a number of treatment options, and is a complaint that carries with it a high risk of complications and morbidity.  The differential diagnosis includes hypoxia, acute exacerbation of asthma, COVID-19 infection, viral etiology   Co morbidities: Discussed in HPI   Brief History:  With a long history of asthma, no improvement in her symptoms with over-the-counter medication, albuterol, nebulizer.  Also taking prescription for Alimentum, on day 4 of her steroid course without any improvement.  EMR reviewed including pt PMHx, past surgical history and past visits to ER.   See HPI for more details   Lab Tests:  I ordered and independently interpreted labs.  The pertinent results include:    I personally reviewed all laboratory work and imaging. Metabolic panel without any acute abnormality specifically kidney function within normal limits and no significant electrolyte abnormalities. CBC without leukocytosis or significant anemia.   Imaging Studies:  NAD. I personally reviewed all imaging studies and no acute abnormality found. I agree with radiology interpretation.    Cardiac Monitoring:  The patient was maintained on a cardiac monitor.  I personally viewed and interpreted the cardiac monitored which showed an underlying rhythm of: NSR  EKG non-ischemic   Medicines ordered:  I ordered medication including continuous nebulizer treatment, magnesium for acute exacerbation of  asthma Reevaluation of the patient after these medicines showed that the patient stayed the same I have reviewed the patients home medicines and  have made adjustments as neede   Reevaluation:  After the interventions noted above I re-evaluated patient and found that they have :stayed the same   Social Determinants of Health:  The patient's social determinants of health were a factor in the care of this patient    Problem List / ED Course:  Patient with recurrent hx of asthma, no relieve with albuterol, given Augmentin, steroids, without any relieve despite 4 days of medications.  Given in the ED and continues nebulizer treatment for 1 hour without much improvement on reassessment.  Also received magnesium without any improvement.  Does have a productive cough now, feels like she is bringing something up however takes in very short sentences.   Dispostion:  After consideration of the diagnostic results and the patients response to treatment, I feel that the patent would benefit from   11:48 PM Spoke to Dr. Sidney Ace who will admit patient for further management.    Portions of this note were generated with Lobbyist. Dictation errors may occur despite best attempts at proofreading.   Final Clinical Impression(s) / ED Diagnoses Final diagnoses:  Moderate persistent asthma with exacerbation    Rx / DC Orders ED Discharge Orders     None         Janeece Fitting, PA-C 03/13/21 2351

## 2021-03-13 NOTE — ED Notes (Signed)
Received verbal report from Adam B RN at this time 

## 2021-03-13 NOTE — ED Triage Notes (Signed)
Pt c/o "cough, sinus pressure x1wk," presents w productive cough, seen at multiple providers for same. Abx & prednisone prescribed, advised to come in if she felt SHOB, low O2 sats. States "O2 sats 88% at home, could bring it up to 95% but it took a lot" ?

## 2021-03-13 NOTE — ED Notes (Addendum)
94% on RA during ambulation. Pt became SOB during ambulation but stayed above 90% on RA ?

## 2021-03-13 NOTE — ED Notes (Signed)
Provider at bedside

## 2021-03-14 DIAGNOSIS — I3139 Other pericardial effusion (noninflammatory): Secondary | ICD-10-CM | POA: Diagnosis not present

## 2021-03-14 DIAGNOSIS — R0602 Shortness of breath: Secondary | ICD-10-CM | POA: Diagnosis not present

## 2021-03-14 DIAGNOSIS — Z20822 Contact with and (suspected) exposure to covid-19: Secondary | ICD-10-CM | POA: Diagnosis not present

## 2021-03-14 DIAGNOSIS — J069 Acute upper respiratory infection, unspecified: Secondary | ICD-10-CM | POA: Diagnosis not present

## 2021-03-14 DIAGNOSIS — E7800 Pure hypercholesterolemia, unspecified: Secondary | ICD-10-CM | POA: Diagnosis present

## 2021-03-14 DIAGNOSIS — R0982 Postnasal drip: Secondary | ICD-10-CM | POA: Diagnosis present

## 2021-03-14 DIAGNOSIS — R062 Wheezing: Secondary | ICD-10-CM | POA: Diagnosis not present

## 2021-03-14 DIAGNOSIS — Z91048 Other nonmedicinal substance allergy status: Secondary | ICD-10-CM | POA: Diagnosis not present

## 2021-03-14 DIAGNOSIS — Z96653 Presence of artificial knee joint, bilateral: Secondary | ICD-10-CM | POA: Diagnosis present

## 2021-03-14 DIAGNOSIS — Z7982 Long term (current) use of aspirin: Secondary | ICD-10-CM | POA: Diagnosis not present

## 2021-03-14 DIAGNOSIS — Z96643 Presence of artificial hip joint, bilateral: Secondary | ICD-10-CM | POA: Diagnosis present

## 2021-03-14 DIAGNOSIS — Z66 Do not resuscitate: Secondary | ICD-10-CM | POA: Diagnosis not present

## 2021-03-14 DIAGNOSIS — E785 Hyperlipidemia, unspecified: Secondary | ICD-10-CM | POA: Diagnosis not present

## 2021-03-14 DIAGNOSIS — Z6841 Body Mass Index (BMI) 40.0 and over, adult: Secondary | ICD-10-CM | POA: Diagnosis not present

## 2021-03-14 DIAGNOSIS — J9811 Atelectasis: Secondary | ICD-10-CM | POA: Diagnosis not present

## 2021-03-14 DIAGNOSIS — J45901 Unspecified asthma with (acute) exacerbation: Secondary | ICD-10-CM | POA: Diagnosis present

## 2021-03-14 DIAGNOSIS — R059 Cough, unspecified: Secondary | ICD-10-CM | POA: Diagnosis not present

## 2021-03-14 DIAGNOSIS — Z8249 Family history of ischemic heart disease and other diseases of the circulatory system: Secondary | ICD-10-CM | POA: Diagnosis not present

## 2021-03-14 DIAGNOSIS — K219 Gastro-esophageal reflux disease without esophagitis: Secondary | ICD-10-CM | POA: Diagnosis present

## 2021-03-14 DIAGNOSIS — R0609 Other forms of dyspnea: Secondary | ICD-10-CM | POA: Diagnosis not present

## 2021-03-14 DIAGNOSIS — I10 Essential (primary) hypertension: Secondary | ICD-10-CM | POA: Diagnosis not present

## 2021-03-14 DIAGNOSIS — Z888 Allergy status to other drugs, medicaments and biological substances status: Secondary | ICD-10-CM | POA: Diagnosis not present

## 2021-03-14 DIAGNOSIS — J4541 Moderate persistent asthma with (acute) exacerbation: Secondary | ICD-10-CM | POA: Diagnosis not present

## 2021-03-14 DIAGNOSIS — I700 Atherosclerosis of aorta: Secondary | ICD-10-CM | POA: Diagnosis not present

## 2021-03-14 DIAGNOSIS — Z79899 Other long term (current) drug therapy: Secondary | ICD-10-CM | POA: Diagnosis not present

## 2021-03-14 LAB — BASIC METABOLIC PANEL
Anion gap: 9 (ref 5–15)
BUN: 15 mg/dL (ref 8–23)
CO2: 26 mmol/L (ref 22–32)
Calcium: 8.4 mg/dL — ABNORMAL LOW (ref 8.9–10.3)
Chloride: 106 mmol/L (ref 98–111)
Creatinine, Ser: 0.7 mg/dL (ref 0.44–1.00)
GFR, Estimated: >60 mL/min (ref >60)
Glucose, Bld: 153 mg/dL — ABNORMAL HIGH (ref 70–99)
Potassium: 4.2 mmol/L (ref 3.5–5.1)
Sodium: 141 mmol/L (ref 135–145)

## 2021-03-14 LAB — RESP PANEL BY RT-PCR (FLU A&B, COVID) ARPGX2
Influenza A by PCR: NEGATIVE
Influenza B by PCR: NEGATIVE
SARS Coronavirus 2 by RT PCR: NEGATIVE

## 2021-03-14 LAB — CBC
HCT: 44.5 % (ref 36.0–46.0)
Hemoglobin: 14.6 g/dL (ref 12.0–15.0)
MCH: 30 pg (ref 26.0–34.0)
MCHC: 32.8 g/dL (ref 30.0–36.0)
MCV: 91.6 fL (ref 80.0–100.0)
Platelets: 214 10*3/uL (ref 150–400)
RBC: 4.86 MIL/uL (ref 3.87–5.11)
RDW: 12.8 % (ref 11.5–15.5)
WBC: 11.2 10*3/uL — ABNORMAL HIGH (ref 4.0–10.5)
nRBC: 0 % (ref 0.0–0.2)

## 2021-03-14 LAB — HIV ANTIBODY (ROUTINE TESTING W REFLEX): HIV Screen 4th Generation wRfx: NONREACTIVE

## 2021-03-14 MED ORDER — MAGNESIUM HYDROXIDE 400 MG/5ML PO SUSP: 30.0000 mL | Freq: Every day | ORAL | Status: DC | PRN

## 2021-03-14 MED ORDER — ASCORBIC ACID 500 MG PO TABS
1000.0000 mg | ORAL_TABLET | Freq: Every day | ORAL | Status: DC
  Administered 2021-03-14: 1000 mg via ORAL
  Filled 2021-03-14: qty 2

## 2021-03-14 MED ORDER — FELODIPINE ER 5 MG PO TB24
10.0000 mg | ORAL_TABLET | Freq: Every day | ORAL | Status: DC
  Administered 2021-03-14: 10 mg via ORAL
  Filled 2021-03-14: qty 2
  Filled 2021-03-14: qty 1

## 2021-03-14 MED ORDER — IRBESARTAN 300 MG PO TABS
300.0000 mg | ORAL_TABLET | Freq: Every day | ORAL | Status: DC
  Administered 2021-03-14: 300 mg via ORAL
  Filled 2021-03-14: qty 1

## 2021-03-14 MED ORDER — ONDANSETRON HCL 4 MG/2ML IJ SOLN
4.0000 mg | Freq: Four times a day (QID) | INTRAMUSCULAR | Status: DC | PRN
Start: 2021-03-14 — End: 2021-03-14

## 2021-03-14 MED ORDER — ONDANSETRON HCL 4 MG PO TABS
4.0000 mg | ORAL_TABLET | Freq: Four times a day (QID) | ORAL | Status: DC | PRN
Start: 2021-03-14 — End: 2021-03-14

## 2021-03-14 MED ORDER — ACETAMINOPHEN 650 MG RE SUPP
650.0000 mg | Freq: Four times a day (QID) | RECTAL | Status: DC | PRN
Start: 2021-03-14 — End: 2021-03-14

## 2021-03-14 MED ORDER — PREDNISONE 20 MG PO TABS: 40.0000 mg | ORAL_TABLET | Freq: Every day | ORAL | Status: DC

## 2021-03-14 MED ORDER — PREDNISONE 20 MG PO TABS: 40.0000 mg | ORAL_TABLET | Freq: Every day | ORAL | 0 refills | Status: DC

## 2021-03-14 MED ORDER — ACETAMINOPHEN 325 MG PO TABS: 650.0000 mg | ORAL_TABLET | Freq: Four times a day (QID) | ORAL | Status: DC | PRN

## 2021-03-14 MED ORDER — IPRATROPIUM-ALBUTEROL 0.5-2.5 (3) MG/3ML IN SOLN
3.0000 mL | Freq: Four times a day (QID) | RESPIRATORY_TRACT | Status: DC
Start: 2021-03-14 — End: 2021-03-14
  Filled 2021-03-14: qty 3

## 2021-03-14 MED ORDER — LABETALOL HCL 200 MG PO TABS
400.0000 mg | ORAL_TABLET | Freq: Two times a day (BID) | ORAL | Status: DC
  Administered 2021-03-14: 400 mg via ORAL
  Filled 2021-03-14 (×2): qty 2

## 2021-03-14 MED ORDER — CYCLOBENZAPRINE HCL 10 MG PO TABS: 10.0000 mg | ORAL_TABLET | Freq: Two times a day (BID) | ORAL | Status: DC | PRN

## 2021-03-14 MED ORDER — SIMVASTATIN 20 MG PO TABS
20.0000 mg | ORAL_TABLET | Freq: Every day | ORAL | Status: DC
  Administered 2021-03-14: 20 mg via ORAL
  Filled 2021-03-14: qty 1

## 2021-03-14 MED ORDER — FLAXSEED (LINSEED) 1000 MG PO CAPS: 1300.0000 mg | ORAL_CAPSULE | Freq: Every day | ORAL | Status: DC

## 2021-03-14 MED ORDER — SODIUM CHLORIDE 0.9 % IV SOLN: INTRAVENOUS | Status: DC

## 2021-03-14 MED ORDER — TRAZODONE HCL 50 MG PO TABS: 25.0000 mg | ORAL_TABLET | Freq: Every evening | ORAL | Status: DC | PRN

## 2021-03-14 MED ORDER — DOCUSATE SODIUM 100 MG PO CAPS
200.0000 mg | ORAL_CAPSULE | Freq: Every day | ORAL | Status: DC
  Administered 2021-03-14: 200 mg via ORAL
  Filled 2021-03-14: qty 2

## 2021-03-14 MED ORDER — MECLIZINE HCL 25 MG PO TABS: 12.5000 mg | ORAL_TABLET | Freq: Three times a day (TID) | ORAL | Status: DC | PRN

## 2021-03-14 MED ORDER — ONDANSETRON 4 MG PO TBDP: 4.0000 mg | ORAL_TABLET | Freq: Three times a day (TID) | ORAL | Status: DC | PRN

## 2021-03-14 MED ORDER — IPRATROPIUM-ALBUTEROL 0.5-2.5 (3) MG/3ML IN SOLN
3.0000 mL | RESPIRATORY_TRACT | Status: DC | PRN
  Administered 2021-03-14: 3 mL via RESPIRATORY_TRACT
  Filled 2021-03-14: qty 3

## 2021-03-14 MED ORDER — ENOXAPARIN SODIUM 40 MG/0.4ML IJ SOSY
40.0000 mg | PREFILLED_SYRINGE | Freq: Every day | INTRAMUSCULAR | Status: DC
  Administered 2021-03-14: 40 mg via SUBCUTANEOUS
  Filled 2021-03-14: qty 0.4

## 2021-03-14 MED ORDER — GARLIC 1000 MG PO CAPS: 1000.0000 mg | ORAL_CAPSULE | Freq: Every day | ORAL | Status: DC

## 2021-03-14 MED ORDER — METHYLPREDNISOLONE SODIUM SUCC 40 MG IJ SOLR
40.0000 mg | Freq: Two times a day (BID) | INTRAMUSCULAR | Status: AC
  Administered 2021-03-14 (×2): 40 mg via INTRAVENOUS
  Filled 2021-03-14 (×2): qty 1

## 2021-03-14 MED ORDER — RISAQUAD PO CAPS
1.0000 | ORAL_CAPSULE | Freq: Every morning | ORAL | Status: DC
  Administered 2021-03-14: 1 via ORAL
  Filled 2021-03-14: qty 1

## 2021-03-14 MED ORDER — ASPIRIN EC 81 MG PO TBEC
81.0000 mg | DELAYED_RELEASE_TABLET | Freq: Every day | ORAL | Status: DC
  Administered 2021-03-14: 81 mg via ORAL
  Filled 2021-03-14: qty 1

## 2021-03-14 MED ORDER — VITAMIN D 25 MCG (1000 UNIT) PO TABS
1000.0000 [IU] | ORAL_TABLET | Freq: Two times a day (BID) | ORAL | Status: DC
  Administered 2021-03-14: 1000 [IU] via ORAL
  Filled 2021-03-14: qty 1

## 2021-03-14 MED ORDER — OMEGA-3-ACID ETHYL ESTERS 1 G PO CAPS
1000.0000 mg | ORAL_CAPSULE | Freq: Two times a day (BID) | ORAL | Status: DC
  Administered 2021-03-14: 1000 mg via ORAL
  Filled 2021-03-14: qty 1

## 2021-03-14 NOTE — Assessment & Plan Note (Signed)
-   The patient will be admitted to a medical telemetry bed. ?- We will continue nebulized steroids, with IV Solu-Medrol. ?- Continue bronchodilator therapy with DuoNeb 4 times daily and every 4 hours as needed. ?- We will place her on mucolytic therapy. ?- O2 protocol will be followed. ?

## 2021-03-14 NOTE — ED Notes (Signed)
O2 sat 93% while ambulating on RA, improved to 96% RA when returned to bed.  ?

## 2021-03-14 NOTE — ED Notes (Signed)
Breakfast order placed ?

## 2021-03-14 NOTE — Discharge Summary (Signed)
PatientPhysician Discharge Summary  [name] CNO:[id number] DOB: [birth year] DOA: 03/13/2021  PCP: Maurice Small, MD  Admit date: 03/13/2021 Discharge date: 03/14/2021 30 Day Unplanned Readmission Risk Score    Flowsheet Row ED from 03/13/2021 in [facility]  30 Day Unplanned Readmission Risk Score (%) 12.1 Filed at 03/14/2021 0801       This score is the patient's risk of an unplanned readmission within 30 days of being discharged (0 -100%). The score is based on dignosis, age, lab data, medications, orders, and past utilization.   Low:  0-14.9   Medium: 15-21.9   High: 22-29.9   Extreme: 30 and above          Admitted From: Home Disposition: Home  Recommendations for Outpatient Follow-up:  Follow up with PCP in 1-2 weeks Please obtain BMP/CBC in one week Please follow up with your PCP on the following pending results: Unresulted Labs (From admission, onward)     Start     Ordered   03/14/21 0025  HIV Antibody (routine testing w rflx)  (HIV Antibody (Routine testing w reflex) panel)  Once,   R        03/14/21 0030              Home Health: None Equipment/Devices: None  Discharge Condition: Stable CODE STATUS: Full code Diet recommendation: Cardiac  Subjective: Seen and examined.  Daughter at the bedside.  Patient states that she is feeling a whole lot better.  She was saturating 97% on room air when I saw her.  Brief/Interim Summary: [name] is a 71 y.o. female with medical history significant for GERD, hypertension, dyslipidemia, spinal stenosis asthma and osteoarthritis, presented to the emergency room with acute onset of worsening dyspnea with associated wheezing and cough and was admitted to hospital service with the diagnosis of acute asthma exacerbation, she did not require any oxygen since she was not hypoxic.  She was started on continuous DuoNeb as well as IV Solu-Medrol.  She improved significantly.  This  morning, she was saturating 97% on room air at rest and 94% with ambulation.  She had very minimal wheezes.  She was telling me that she is ready to go home.  She is being discharged with 4 more days of oral prednisone.  Discharge plan was discussed with patient and/or family member and they verbalized understanding and agreed with it.  Discharge Diagnoses:  Principal Problem:   Acute asthma exacerbation Active Problems:   Essential hypertension, benign   Dyslipidemia   Asthma exacerbation    Discharge Instructions   Allergies as of 03/14/2021       Reactions   Etodolac Other (See Comments)   Adhesive [tape] Rash, Other (See Comments)   Blisters - reaction to adhesive on any type of bandages if stay long time        Medication List     STOP taking these medications    amoxicillin-clavulanate 875-125 MG tablet Commonly known as: AUGMENTIN   cyclobenzaprine 10 MG tablet Commonly known as: FLEXERIL       TAKE these medications    albuterol 108 (90 Base) MCG/ACT inhaler Commonly known as: VENTOLIN HFA Inhale 2 puffs into the lungs every 6 (six) hours as needed for wheezing or shortness of breath.   albuterol 0.63 MG/3ML nebulizer solution Commonly known as: ACCUNEB Take 1 ampule by nebulization every 6 (six) hours as needed for wheezing or shortness of breath.   Alpha Lipoic Acid 200  MG Caps 1.5 tablet   aspirin 81 MG EC tablet Take 81 mg by mouth See admin instructions. **pt takes twice a week**   chlorpheniramine-HYDROcodone 10-8 MG/5ML Take 5 mLs by mouth at bedtime.   cholecalciferol 1000 units tablet Commonly known as: VITAMIN D Take 1,000 Units by mouth 2 (two) times daily.   cholecalciferol 25 MCG (1000 UNIT) tablet Commonly known as: VITAMIN D3 1 tablet   Cinnamon 500 MG capsule Take 500 mg by mouth 2 (two) times daily.   Co Q-10 200 MG Caps 1 capsule   diclofenac 75 MG EC tablet Commonly known as: VOLTAREN Take 1 tablet (75 mg total) by  mouth 2 (two) times daily as needed for mild pain.   docusate sodium 100 MG capsule Commonly known as: COLACE Take 200 mg by mouth at bedtime.   felodipine 10 MG 24 hr tablet Commonly known as: PLENDIL Take 10 mg by mouth daily.   FLAXSEED (LINSEED) PO Take 1,300 mg by mouth daily.   Garlic 1749 MG Caps Take 1,000 mg by mouth daily.   Grape Seed 50 MG Tabs Take 50 mg by mouth daily.   guaiFENesin 600 MG 12 hr tablet Commonly known as: MUCINEX Take 600 mg by mouth 2 (two) times daily.   guaiFENesin-codeine 100-10 MG/5ML syrup SMARTSIG:10 Milliliter(s) By Mouth Every 8 Hours PRN   ipratropium-albuterol 0.5-2.5 (3) MG/3ML Soln Commonly known as: DUONEB Take 3 mLs by nebulization every 6 (six) hours as needed (shortness of breath).   irbesartan 300 MG tablet Commonly known as: AVAPRO Take 300 mg by mouth daily.   labetalol 200 MG tablet Commonly known as: NORMODYNE Take 400 mg by mouth 2 (two) times daily.   lactobacillus acidophilus Tabs tablet Take 2 tablets by mouth every morning.   Lecithin 1200 MG Caps Take 1,200 mg by mouth daily.   lidocaine-hydrocortisone 3-0.5 % Crea Commonly known as: ANAMANTEL HC Place 1 Applicatorful rectally as needed (hemorrhoids).   Lutein 20 MG Tabs Take 20 mg by mouth daily.   meclizine 25 MG tablet Commonly known as: ANTIVERT 1 tablet as needed   multivitamin with minerals Tabs tablet Take 0.5 tablets by mouth 2 (two) times daily.   Omega 3 1200 MG Caps Take 1,200 mg by mouth 2 (two) times daily.   ondansetron 4 MG disintegrating tablet Commonly known as: Zofran ODT Take 1 tablet (4 mg total) by mouth every 8 (eight) hours as needed for nausea or vomiting.   predniSONE 20 MG tablet Commonly known as: DELTASONE Take 2 tablets (40 mg total) by mouth daily for 4 days.   simvastatin 20 MG tablet Commonly known as: ZOCOR Take 20 mg by mouth at bedtime.   sodium chloride 0.65 % Soln nasal spray Commonly known as:  OCEAN Place 1 spray into both nostrils as needed for congestion.   vitamin C 500 MG tablet Commonly known as: ASCORBIC ACID Take 500 mg by mouth daily.   vitamin C 1000 MG tablet Take 1,000 mg by mouth daily.        Follow-up Information     Maurice Small, MD Follow up in 1 week(s).   Specialty: Family Medicine Contact information: [address] [place] [address] [phone]                Allergies  Allergen Reactions   Etodolac Other (See Comments)   Adhesive [Tape] Rash and Other (See Comments)    Blisters - reaction to adhesive on any type of  bandages if stay long time    Consultations: None   Procedures/Studies: DG Chest 2 View  Result Date: 03/13/2021 CLINICAL DATA:  Shortness of breath EXAM: CHEST - 2 VIEW COMPARISON:  03/09/2020 FINDINGS: Heart and mediastinal contours are within normal limits. No focal opacities or effusions. No acute bony abnormality. IMPRESSION: No active cardiopulmonary disease. Electronically Signed   By: Rolm Baptise M.D.   On: 03/13/2021 19:48     Discharge Exam: Vitals:   03/14/21 0430 03/14/21 0735  BP: 136/64 (!) 145/62  Pulse: (!) 57 (!) 56  Resp: 18 16  Temp:    SpO2: 90% 96%   Vitals:   03/14/21 0200 03/14/21 0300 03/14/21 0430 03/14/21 0735  BP: (!) 146/66 (!) 161/72 136/64 (!) 145/62  Pulse: (!) 58 (!) 58 (!) 57 (!) 56  Resp: '18 13 18 16  '$ Temp:      SpO2: 94% 96% 90% 96%  Weight:      Height:        General: Pt is alert, awake, not in acute distress Cardiovascular: RRR, S1/S2 +, no rubs, no gallops Respiratory: CTA bilaterally, very minimal end expiratory wheezes, no rhonchi Abdominal: Soft, NT, ND, bowel sounds + Extremities: no edema, no cyanosis    The results of significant diagnostics from this hospitalization (including imaging, microbiology, ancillary and laboratory) are listed below for reference.     Microbiology: Recent Results (from the past 240 hour(s))  Resp Panel  by RT-PCR (Flu A&B, Covid) Nasopharyngeal Swab     Status: None   Collection Time: 03/14/21 12:09 AM   Specimen: Nasopharyngeal Swab; Nasopharyngeal(NP) swabs in vial transport medium  Result Value Ref Range Status   SARS Coronavirus 2 by RT PCR NEGATIVE NEGATIVE Final    Comment: (NOTE) SARS-CoV-2 target nucleic acids are NOT DETECTED.  The SARS-CoV-2 RNA is generally detectable in upper respiratory specimens during the acute phase of infection. The lowest concentration of SARS-CoV-2 viral copies this assay can detect is 138 copies/mL. A negative result does not preclude SARS-Cov-2 infection and should not be used as the sole basis for treatment or other patient management decisions. A negative result may occur with  improper specimen collection/handling, submission of specimen other than nasopharyngeal swab, presence of viral mutation(s) within the areas targeted by this assay, and inadequate number of viral copies(<138 copies/mL). A negative result must be combined with clinical observations, patient history, and epidemiological information. The expected result is Negative.  Fact Sheet for Patients:  EntrepreneurPulse.com.au  Fact Sheet for Healthcare Providers:  IncredibleEmployment.be  This test is no t yet approved or cleared by the [place] FDA and  has been authorized for detection and/or diagnosis of SARS-CoV-2 by FDA under an Emergency Use Authorization (EUA). This EUA will remain  in effect (meaning this test can be used) for the duration of the COVID-19 declaration under Section 564(b)(1) of the Act, 21 U.S.C.section 360bbb-3(b)(1), unless the authorization is terminated  or revoked sooner.       Influenza A by PCR NEGATIVE NEGATIVE Final   Influenza B by PCR NEGATIVE NEGATIVE Final    Comment: (NOTE) The Xpert Xpress SARS-CoV-2/FLU/RSV plus assay is intended as an aid in the diagnosis of influenza from Nasopharyngeal swab  specimens and should not be used as a sole basis for treatment. Nasal washings and aspirates are unacceptable for Xpert Xpress SARS-CoV-2/FLU/RSV testing.  Fact Sheet for Patients: EntrepreneurPulse.com.au  Fact Sheet for Healthcare Providers: IncredibleEmployment.be  This test is not yet approved or cleared by the  [place] Architectural technologist and has been authorized for detection and/or diagnosis of SARS-CoV-2 by FDA under an Print production planner (EUA). This EUA will remain in effect (meaning this test can be used) for the duration of the COVID-19 declaration under Section 564(b)(1) of the Act, 21 U.S.C. section 360bbb-3(b)(1), unless the authorization is terminated or revoked.  Performed at [facility], [place] [address], [place], [place] [address]      Labs: BNP (last 3 results) No results for input(s): BNP in the last 8760 hours. Basic Metabolic Panel: Recent Labs  Lab 03/13/21 1915 03/14/21 0317  NA 140 141  K 4.1 4.2  CL 103 106  CO2 28 26  GLUCOSE 136* 153*  BUN 15 15  CREATININE 0.69 0.70  CALCIUM 8.8* 8.4*   Liver Function Tests: Recent Labs  Lab 03/13/21 1915  AST 21  ALT 21  ALKPHOS 74  BILITOT 0.5  PROT 6.3*  ALBUMIN 3.6   No results for input(s): LIPASE, AMYLASE in the last 168 hours. No results for input(s): AMMONIA in the last 168 hours. CBC: Recent Labs  Lab 03/13/21 1915 03/14/21 0317  WBC 8.8 11.2*  NEUTROABS 6.2  --   HGB 14.5 14.6  HCT 45.6 44.5  MCV 92.3 91.6  PLT 213 214   Cardiac Enzymes: No results for input(s): CKTOTAL, CKMB, CKMBINDEX, TROPONINI in the last 168 hours. BNP: Invalid input(s): POCBNP CBG: No results for input(s): GLUCAP in the last 168 hours. D-Dimer No results for input(s): DDIMER in the last 72 hours. Hgb A1c No results for input(s): HGBA1C in the last 72 hours. Lipid Profile No results for input(s): CHOL, HDL, LDLCALC, TRIG, CHOLHDL, LDLDIRECT in the last 72  hours. Thyroid function studies No results for input(s): TSH, T4TOTAL, T3FREE, THYROIDAB in the last 72 hours.  Invalid input(s): FREET3 Anemia work up No results for input(s): VITAMINB12, FOLATE, FERRITIN, TIBC, IRON, RETICCTPCT in the last 72 hours. Urinalysis    Component Value Date/Time   COLORURINE YELLOW 12/17/2020 [place] 12/17/2020 1208   LABSPEC 1.010 12/17/2020 1208   PHURINE 7.5 12/17/2020 1208   GLUCOSEU NEGATIVE 12/17/2020 1208   HGBUR NEGATIVE 12/17/2020 [place] 05/21/2020 1228   BILIRUBINUR n 11/13/2018 [place] 12/17/2020 1208   PROTEINUR NEGATIVE 12/17/2020 1208   UROBILINOGEN 0.2 11/13/2018 1632   UROBILINOGEN 0.2 11/12/2007 1402   NITRITE NEGATIVE 12/17/2020 1208   LEUKOCYTESUR NEGATIVE 12/17/2020 1208   Sepsis Labs Invalid input(s): PROCALCITONIN,  WBC,  LACTICIDVEN Microbiology Recent Results (from the past 240 hour(s))  Resp Panel by RT-PCR (Flu A&B, Covid) Nasopharyngeal Swab     Status: None   Collection Time: 03/14/21 12:09 AM   Specimen: Nasopharyngeal Swab; Nasopharyngeal(NP) swabs in vial transport medium  Result Value Ref Range Status   SARS Coronavirus 2 by RT PCR NEGATIVE NEGATIVE Final    Comment: (NOTE) SARS-CoV-2 target nucleic acids are NOT DETECTED.  The SARS-CoV-2 RNA is generally detectable in upper respiratory specimens during the acute phase of infection. The lowest concentration of SARS-CoV-2 viral copies this assay can detect is 138 copies/mL. A negative result does not preclude SARS-Cov-2 infection and should not be used as the sole basis for treatment or other patient management decisions. A negative result may occur with  improper specimen collection/handling, submission of specimen other than nasopharyngeal swab, presence of viral mutation(s) within the areas targeted by this assay, and inadequate number of viral copies(<138 copies/mL). A negative result must be  combined  with clinical observations, patient history, and epidemiological information. The expected result is Negative.  Fact Sheet for Patients:  EntrepreneurPulse.com.au  Fact Sheet for Healthcare Providers:  IncredibleEmployment.be  This test is no t yet approved or cleared by the [place] FDA and  has been authorized for detection and/or diagnosis of SARS-CoV-2 by FDA under an Emergency Use Authorization (EUA). This EUA will remain  in effect (meaning this test can be used) for the duration of the COVID-19 declaration under Section 564(b)(1) of the Act, 21 U.S.C.section 360bbb-3(b)(1), unless the authorization is terminated  or revoked sooner.       Influenza A by PCR NEGATIVE NEGATIVE Final   Influenza B by PCR NEGATIVE NEGATIVE Final    Comment: (NOTE) The Xpert Xpress SARS-CoV-2/FLU/RSV plus assay is intended as an aid in the diagnosis of influenza from Nasopharyngeal swab specimens and should not be used as a sole basis for treatment. Nasal washings and aspirates are unacceptable for Xpert Xpress SARS-CoV-2/FLU/RSV testing.  Fact Sheet for Patients: EntrepreneurPulse.com.au  Fact Sheet for Healthcare Providers: IncredibleEmployment.be  This test is not yet approved or cleared by the [place] FDA and has been authorized for detection and/or diagnosis of SARS-CoV-2 by FDA under an Emergency Use Authorization (EUA). This EUA will remain in effect (meaning this test can be used) for the duration of the COVID-19 declaration under Section 564(b)(1) of the Act, 21 U.S.C. section 360bbb-3(b)(1), unless the authorization is terminated or revoked.  Performed at [facility], [place] [address], [place], [place] [address]      Time coordinating discharge: Over 30 minutes  SIGNED:   Darliss Cheney, MD  [facility] 03/14/2021, 9:55 AM *Please note that this is a verbal dictation therefore  any spelling or grammatical errors are due to the "[place] [employer]" system interpretation. If 7PM-7AM, please contact night-coverage [email or web address]

## 2021-03-14 NOTE — ED Notes (Signed)
Admit provider at bedside 

## 2021-03-14 NOTE — ED Notes (Signed)
Verbal report given to Stephani Police RN at this time ?

## 2021-03-14 NOTE — Assessment & Plan Note (Signed)
-   We will continue carvedilol and ARB therapy. ?

## 2021-03-14 NOTE — ED Notes (Signed)
Ambulated to restroom without assistance at this time 

## 2021-03-14 NOTE — Assessment & Plan Note (Signed)
-   We will continue statin therapy as well as omega-3 capsules.Marland Kitchen ?

## 2021-03-14 NOTE — Care Management CC44 (Addendum)
Condition Code 44 Documentation Completed ? ?Patient Details  ?Name: [name] ?MRN: [id number] ?Date of Birth: 06-22-1950 ? ? ?Condition Code 44 given:  Yes ?Patient signature on Condition Code 44 notice:  Yes ?Documentation of 2 MD's agreement:  Yes ?Code 44 added to claim:  Yes ? ? ? ?Arlie Solomons Todd Argabright, LCSW ?03/14/2021, 9:46 AM ? ?[place] ?Updated from completed 12:15pm.  ? ?

## 2021-03-14 NOTE — H&P (Addendum)
[place]   PATIENT NAME: [name]    MR#:  [id number]  DATE OF BIRTH:  August 04, 1950  DATE OF ADMISSION:  03/13/2021  PRIMARY CARE PHYSICIAN: Maurice Small, MD   Patient is coming from: Home  REQUESTING/REFERRING PHYSICIAN: Janeece Fitting, PA-C  CHIEF COMPLAINT:   Chief Complaint  Patient presents with   Cough   Shortness of Breath    HISTORY OF PRESENT ILLNESS:  [name] is a 71 y.o. female with medical history significant for GERD, hypertension, dyslipidemia, spinal stenosis asthma and osteoarthritis, presented to the emergency room with acute onset of worsening dyspnea with associated wheezing and cough mostly dry and occasionally productive of clear sputum since Monday night.  She has been using her nebulizer and inhalers without significant benefit.  She admitted to fever 101 on Wednesday without chills.  She called her PCP and was prescribed p.o. prednisone and Augmentin which she has been taking for the last 4 days.  No nausea or vomiting or abdominal pain.  No chest pain or palpitations.  No dysuria, oliguria or hematuria or flank pain.  She had a negative outpatient COVID-19 test.  She had diminished taste sensation on a chronic basis.  Hypoxic currently was 88% on room air at home.  ED Course: Upon presentation to the ER, BP was 182/91 and later 140/77 with otherwise normal vital signs.  She had normal pulse oximetry here.  Labs revealed troponin of 6.3 with otherwise unremarkable CMP.  CBC was within normal.  Influenza antigens and COVID-19 PCR came back negative. EKG as reviewed by me : EKG showed normal sinus rhythm with rate of 82 with Q waves anteroseptally. Imaging: Two-view chest ray showed no acute cardiopulmonary disease.  The patient was given in IV magnesium sulfate, 1 hour of continuous nebulized albuterol.  She will be admitted to a medical telemetry bed for further evaluation and management. PAST MEDICAL HISTORY:   Past Medical History:   Diagnosis Date   Abnormal Pap smear of cervix 03/2009   [place] Biopsy CIN I with HPV   Arthritis    Asthma    Bilateral edema of lower extremity    Takes lasix if needed   Bronchitis    Disc disorder    bulging disc in thoracic area   GERD (gastroesophageal reflux disease)    occ   Hypercholesteremia    Hypertension since 1990's   Lumbar herniated disc    Obesity    Pneumonia    hx   Scoliosis    Spinal stenosis    Teeth grinding     PAST SURGICAL HISTORY:   Past Surgical History:  Procedure Laterality Date   [place]   Thoracic and Lumbar   BREAST BIOPSY Right 1990   benign cyst   BREAST EXCISIONAL BIOPSY Right 1990   benign   CERVICAL [place] SURGERY  2007   COLONOSCOPY W/ [place], as child   bilateral inguinal -1 during pregnancy.  Umbilical repair as child   KNEE ARTHROSCOPY Right 1990   LUMBAR [place] SURGERY  2006   SHOULDER ARTHROSCOPY Left 1994   arthritis, hips,fingers   TONSILLECTOMY AND ADENOIDECTOMY     TOTAL HIP ARTHROPLASTY Right 09/08/2014   Procedure: RIGHT TOTAL HIP ARTHROPLASTY ANTERIOR APPROACH;  Surgeon: Mcarthur Rossetti, MD;  Location: [facility];  Service: Orthopedics;  Laterality: Right;   TOTAL HIP ARTHROPLASTY Left 11/02/2014  Procedure: LEFT TOTAL HIP ARTHROPLASTY ANTERIOR APPROACH;  Surgeon: Mcarthur Rossetti, MD;  Location: [facility];  Service: Orthopedics;  Laterality: Left;   TOTAL KNEE ARTHROPLASTY Right 2009   TOTAL KNEE ARTHROPLASTY Left 08/10/2015   Procedure: LEFT TOTAL KNEE ARTHROPLASTY;  Surgeon: Mcarthur Rossetti, MD;  Location: [facility];  Service: Orthopedics;  Laterality: Left;    SOCIAL HISTORY:   Social History   Tobacco Use   Smoking status: Never   Smokeless tobacco: Never  Substance Use Topics   Alcohol use: Not Currently    Comment: occasionally     FAMILY HISTORY:   Family History  Problem Relation Age of Onset   Breast cancer Sister 15        bilateral mastectomy- Breast cancer    Hypertension Mother    Heart disease Mother    Stroke Mother    Heart disease Father    Depression Father    Alcohol abuse Father    Stroke Maternal Grandmother    Emphysema Paternal Grandmother    Breast cancer Maternal Aunt        Both aunts in 46 -28's   Heart failure Paternal Grandfather    Alcohol abuse Paternal Grandfather    Depression Daughter     DRUG ALLERGIES:   Allergies  Allergen Reactions   Etodolac Other (See Comments)   Adhesive [Tape] Rash and Other (See Comments)    Blisters - reaction to adhesive on any type of bandages if stay long time    REVIEW OF SYSTEMS:   ROS As per history of present illness. All pertinent systems were reviewed above. Constitutional, HEENT, cardiovascular, respiratory, GI, GU, musculoskeletal, neuro, psychiatric, endocrine, integumentary and hematologic systems were reviewed and are otherwise negative/unremarkable except for positive findings mentioned above in the HPI.   MEDICATIONS AT HOME:   Prior to Admission medications   Medication Sig Start Date End Date Taking? Authorizing Provider  albuterol (ACCUNEB) 0.63 MG/3ML nebulizer solution Take 1 ampule by nebulization every 6 (six) hours as needed for wheezing or shortness of breath.  02/15/15   [provider]  albuterol (VENTOLIN HFA) 108 (90 Base) MCG/ACT inhaler Inhale 2 puffs into the lungs every 6 (six) hours as needed for wheezing or shortness of breath.    [provider]  Alpha Lipoic Acid 200 MG CAPS 1.5 tablet    [provider]  Ascorbic Acid (VITAMIN C) 1000 MG tablet 1 tablet    [provider]  aspirin 81 MG EC tablet 1 tablet    [provider]  cholecalciferol (VITAMIN D) 1000 UNITS tablet Take 1,000 Units by mouth 2 (two) times daily.    [provider]  cholecalciferol (VITAMIN D3) 25 MCG (1000 UNIT) tablet 1 tablet    [provider]  Cinnamon 500 MG capsule  Take 500 mg by mouth 2 (two) times daily.    [provider]  Coenzyme Q10 (CO Q-10) 200 MG CAPS 1 capsule    [provider]  cyclobenzaprine (FLEXERIL) 10 MG tablet Take 1 tablet (10 mg total) by mouth 2 (two) times daily as needed for muscle spasms. 08/13/15   Mcarthur Rossetti, MD  diclofenac (VOLTAREN) 75 MG EC tablet Take 1 tablet (75 mg total) by mouth 2 (two) times daily as needed for mild pain. 11/05/14   Pete Pelt, PA-C  docusate sodium (COLACE) 100 MG capsule Take 200 mg by mouth at bedtime.    [provider]  felodipine (PLENDIL) 10 MG  24 hr tablet Take 10 mg by mouth daily.    [provider]  FLAXSEED, LINSEED, PO Take 1,300 mg by mouth daily.     [provider]  Garlic 7564 MG CAPS Take 1,000 mg by mouth daily.    [provider]  Grape Seed 50 MG TABS Take 50 mg by mouth daily.    [provider]  ipratropium-albuterol (DUONEB) 0.5-2.5 (3) MG/3ML SOLN Take 3 mLs by nebulization every 6 (six) hours as needed (shortness of breath).     [provider]  irbesartan (AVAPRO) 300 MG tablet Take 300 mg by mouth daily.    [provider]  labetalol (NORMODYNE) 200 MG tablet Take 400 mg by mouth 2 (two) times daily.     [provider]  lactobacillus acidophilus (BACID) TABS tablet Take 2 tablets by mouth every morning.    [provider]  Lecithin 1200 MG CAPS Take 1,200 mg by mouth daily.    [provider]  lidocaine-hydrocortisone (ANAMANTEL HC) 3-0.5 % CREA Place 1 Applicatorful rectally as needed (hemorrhoids).     [provider]  Lutein 20 MG TABS Take 20 mg by mouth daily.     [provider]  meclizine (ANTIVERT) 25 MG tablet 1 tablet as needed    [provider]  Multiple Vitamin (MULTIVITAMIN WITH MINERALS) TABS tablet Take 0.5 tablets by mouth 2 (two) times daily.     [provider]  Omega 3 1200 MG CAPS Take 1,200 mg by  mouth 2 (two) times daily.    [provider]  ondansetron (ZOFRAN ODT) 4 MG disintegrating tablet Take 1 tablet (4 mg total) by mouth every 8 (eight) hours as needed for nausea or vomiting. 05/21/20   Caccavale, Sophia, PA-C  simvastatin (ZOCOR) 20 MG tablet Take 20 mg by mouth at bedtime.     [provider]  vitamin C (ASCORBIC ACID) 500 MG tablet Take 500 mg by mouth daily.    [provider]      VITAL SIGNS:  Blood pressure (!) 146/66, pulse (!) 58, temperature 98.9 F (37.2 C), resp. rate 18, height '5\' 2"'$  (1.575 m), weight 101.2 kg, last menstrual period 11/15/2015, SpO2 94 %.  PHYSICAL EXAMINATION:  Physical Exam  GENERAL:  71 y.o.-year-old  female patient lying in the bed with mild respiratory distress with conversational dyspnea. EYES: Pupils equal, round, reactive to light and accommodation. No scleral icterus. Extraocular muscles intact.  HEENT: Head atraumatic, normocephalic. Oropharynx and nasopharynx clear.  NECK:  Supple, no jugular venous distention. No thyroid enlargement, no tenderness.  LUNGS: Diffuse expiratory wheezes with tight expiratory airflow and harsh vesicular breathing.  She has occasional rhonchi. CARDIOVASCULAR: Regular rate and rhythm, S1, S2 normal. No murmurs, rubs, or gallops.  ABDOMEN: Soft, nondistended, nontender. Bowel sounds present. No organomegaly or mass.  EXTREMITIES: No pedal edema, cyanosis, or clubbing.  NEUROLOGIC: Cranial nerves II through XII are intact. Muscle strength 5/5 in all extremities. Sensation intact. Gait not checked.  PSYCHIATRIC: The patient is alert and oriented x 3.  Normal affect and good eye contact. SKIN: No obvious rash, lesion, or ulcer.   LABORATORY PANEL:   CBC Recent Labs  Lab 03/13/21 1915  WBC 8.8  HGB 14.5  HCT 45.6  PLT 213   ------------------------------------------------------------------------------------------------------------------  Chemistries  Recent Labs  Lab  03/13/21 1915  NA 140  K 4.1  CL 103  CO2 28  GLUCOSE 136*  BUN 15  CREATININE 0.69  CALCIUM  8.8*  AST 21  ALT 21  ALKPHOS 74  BILITOT 0.5   ------------------------------------------------------------------------------------------------------------------  Cardiac Enzymes No results for input(s): TROPONINI in the last 168 hours. ------------------------------------------------------------------------------------------------------------------  RADIOLOGY:  DG Chest 2 View  Result Date: 03/13/2021 CLINICAL DATA:  Shortness of breath EXAM: CHEST - 2 VIEW COMPARISON:  03/09/2020 FINDINGS: Heart and mediastinal contours are within normal limits. No focal opacities or effusions. No acute bony abnormality. IMPRESSION: No active cardiopulmonary disease. Electronically Signed   By: Rolm Baptise M.D.   On: 03/13/2021 19:48      IMPRESSION AND PLAN:  Assessment and Plan: * Acute asthma exacerbation - The patient will be admitted to a medical telemetry bed. - We will continue nebulized steroids, with IV Solu-Medrol. - Continue bronchodilator therapy with DuoNeb 4 times daily and every 4 hours as needed. - We will place her on mucolytic therapy. - O2 protocol will be followed.  Essential hypertension, benign - We will continue carvedilol and ARB therapy.  Dyslipidemia - We will continue statin therapy as well as omega-3 capsules..    DVT prophylaxis: Lovenox. Advanced Care Planning:  Code Status: Partial code.  She is DNI only. Family Communication:  The plan of care was discussed in details with the patient (and her daughter). I answered all questions. The patient agreed to proceed with the above mentioned plan. Further management will depend upon hospital course. Disposition Plan: Back to previous home environment Consults called: none. All the records are reviewed and case discussed with ED provider.  Status is: Inpatient  At the time of the admission, it appears that the  appropriate admission status for this patient is inpatient.  This is judged to be reasonable and necessary in order to provide the required intensity of service to ensure the patient's safety given the presenting symptoms, physical exam findings and initial radiographic and laboratory data in the context of comorbid conditions.  The patient requires inpatient status due to high intensity of service, high risk of further deterioration and high frequency of surveillance required, especially after requiring 1 hour of continuous nebulized albuterol and having persistent symptoms.  I certify that at the time of admission, it is my clinical judgment that the patient will require inpatient hospital care extending more than 2 midnights.                            Dispo: The patient is from: Home              Anticipated d/c is to: Home              Patient currently is not medically stable to d/c.              Difficult to place patient: No  Christel Mormon M.D on 03/14/2021 at 2:52 AM  [facility]   From 7 PM-7 AM, contact night-coverage [email or web address]  CC: Primary care physician; Maurice Small, MD

## 2021-03-14 NOTE — ED Notes (Signed)
Pt 02 dropped to upper 80's while sleeping. Pt placed on 2 L [place]. 02 sat 95%.  ?

## 2021-03-14 NOTE — ED Notes (Signed)
Patient verbalizes understanding of discharge instructions. Opportunity for questioning and answers were provided. Armband removed by staff, pt discharged from ED and ambulated to lobby to return home.   

## 2021-03-14 NOTE — Progress Notes (Signed)
PHARMACIST - PHYSICIAN ORDER COMMUNICATION ? ?CONCERNING: P&T Medication Policy on Herbal Medications ? ?DESCRIPTION:  This patient?s orders for:  Flaxseed and Garlic  have been noted. ? ?This product(s) is classified as an ?herbal? or natural product. ?Due to a lack of definitive safety studies or FDA approval, nonstandard manufacturing practices, plus the potential risk of unknown drug-drug interactions while on inpatient medications, the Pharmacy and Therapeutics Committee does not permit the use of ?herbal? or natural products of this type within [facility]. ?  ?ACTION TAKEN: ?The pharmacy department is unable to verify this order at this time and your patient has been informed of this safety policy. ?Please reevaluate patient?s clinical condition at discharge and address if the herbal or natural product(s) should be resumed at that time. ? ?

## 2021-03-14 NOTE — Care Management Important Message (Signed)
Important Message ? ?Patient Details  ?Name: [name] ?MRN: [id number] ?Date of Birth: 1950-12-15 ? ? ?Medicare Important Message Given:  Yes ? ? ? ? ?Arlie Solomons Demitria Hay, LCSW ?03/14/2021, 9:45 AM ?

## 2021-03-16 ENCOUNTER — Emergency Department (HOSPITAL_BASED_OUTPATIENT_CLINIC_OR_DEPARTMENT_OTHER): Payer: Medicare Other | Admitting: Radiology

## 2021-03-16 ENCOUNTER — Other Ambulatory Visit: Payer: Self-pay

## 2021-03-16 ENCOUNTER — Inpatient Hospital Stay (HOSPITAL_BASED_OUTPATIENT_CLINIC_OR_DEPARTMENT_OTHER)
Admission: EM | Admit: 2021-03-16 | Discharge: 2021-03-18 | DRG: 202 | Disposition: A | Discharge status: Home / Self Care | Payer: Medicare Other | Attending: Internal Medicine | Admitting: Internal Medicine

## 2021-03-16 ENCOUNTER — Encounter (HOSPITAL_BASED_OUTPATIENT_CLINIC_OR_DEPARTMENT_OTHER): Payer: Self-pay | Admitting: Emergency Medicine

## 2021-03-16 DIAGNOSIS — J4541 Moderate persistent asthma with (acute) exacerbation: Secondary | ICD-10-CM | POA: Diagnosis not present

## 2021-03-16 DIAGNOSIS — Z66 Do not resuscitate: Secondary | ICD-10-CM | POA: Diagnosis present

## 2021-03-16 DIAGNOSIS — Z91048 Other nonmedicinal substance allergy status: Secondary | ICD-10-CM | POA: Diagnosis not present

## 2021-03-16 DIAGNOSIS — Z20822 Contact with and (suspected) exposure to covid-19: Secondary | ICD-10-CM | POA: Diagnosis present

## 2021-03-16 DIAGNOSIS — E785 Hyperlipidemia, unspecified: Secondary | ICD-10-CM | POA: Diagnosis not present

## 2021-03-16 DIAGNOSIS — Z888 Allergy status to other drugs, medicaments and biological substances status: Secondary | ICD-10-CM

## 2021-03-16 DIAGNOSIS — Z8249 Family history of ischemic heart disease and other diseases of the circulatory system: Secondary | ICD-10-CM

## 2021-03-16 DIAGNOSIS — K219 Gastro-esophageal reflux disease without esophagitis: Secondary | ICD-10-CM | POA: Diagnosis not present

## 2021-03-16 DIAGNOSIS — E7800 Pure hypercholesterolemia, unspecified: Secondary | ICD-10-CM | POA: Diagnosis present

## 2021-03-16 DIAGNOSIS — Z7982 Long term (current) use of aspirin: Secondary | ICD-10-CM | POA: Diagnosis not present

## 2021-03-16 DIAGNOSIS — Z79899 Other long term (current) drug therapy: Secondary | ICD-10-CM

## 2021-03-16 DIAGNOSIS — R0982 Postnasal drip: Secondary | ICD-10-CM

## 2021-03-16 DIAGNOSIS — R062 Wheezing: Secondary | ICD-10-CM

## 2021-03-16 DIAGNOSIS — Z6841 Body Mass Index (BMI) 40.0 and over, adult: Secondary | ICD-10-CM

## 2021-03-16 DIAGNOSIS — R0602 Shortness of breath: Secondary | ICD-10-CM | POA: Diagnosis not present

## 2021-03-16 DIAGNOSIS — Z96653 Presence of artificial knee joint, bilateral: Secondary | ICD-10-CM | POA: Diagnosis present

## 2021-03-16 DIAGNOSIS — R059 Cough, unspecified: Secondary | ICD-10-CM | POA: Diagnosis not present

## 2021-03-16 DIAGNOSIS — I10 Essential (primary) hypertension: Secondary | ICD-10-CM | POA: Diagnosis not present

## 2021-03-16 DIAGNOSIS — J45901 Unspecified asthma with (acute) exacerbation: Principal | ICD-10-CM | POA: Diagnosis present

## 2021-03-16 DIAGNOSIS — J069 Acute upper respiratory infection, unspecified: Secondary | ICD-10-CM | POA: Diagnosis not present

## 2021-03-16 DIAGNOSIS — Z96643 Presence of artificial hip joint, bilateral: Secondary | ICD-10-CM | POA: Diagnosis not present

## 2021-03-16 LAB — BRAIN NATRIURETIC PEPTIDE: B Natriuretic Peptide: 90.9 pg/mL (ref 0.0–100.0)

## 2021-03-16 LAB — COMPREHENSIVE METABOLIC PANEL
ALT: 20 U/L (ref 0–44)
AST: 18 U/L (ref 15–41)
Albumin: 3.9 g/dL (ref 3.5–5.0)
Alkaline Phosphatase: 71 U/L (ref 38–126)
Anion gap: 8 (ref 5–15)
BUN: 20 mg/dL (ref 8–23)
CO2: 30 mmol/L (ref 22–32)
Calcium: 8.7 mg/dL — ABNORMAL LOW (ref 8.9–10.3)
Chloride: 105 mmol/L (ref 98–111)
Creatinine, Ser: 0.81 mg/dL (ref 0.44–1.00)
GFR, Estimated: >60 mL/min (ref >60)
Glucose, Bld: 98 mg/dL (ref 70–99)
Potassium: 3.7 mmol/L (ref 3.5–5.1)
Sodium: 143 mmol/L (ref 135–145)
Total Bilirubin: 0.5 mg/dL (ref 0.3–1.2)
Total Protein: 6.6 g/dL (ref 6.5–8.1)

## 2021-03-16 LAB — CBC
HCT: 44.9 % (ref 36.0–46.0)
Hemoglobin: 14.3 g/dL (ref 12.0–15.0)
MCH: 28.7 pg (ref 26.0–34.0)
MCHC: 31.8 g/dL (ref 30.0–36.0)
MCV: 90 fL (ref 80.0–100.0)
Platelets: 224 10*3/uL (ref 150–400)
RBC: 4.99 MIL/uL (ref 3.87–5.11)
RDW: 13.2 % (ref 11.5–15.5)
WBC: 10.9 10*3/uL — ABNORMAL HIGH (ref 4.0–10.5)
nRBC: 0 % (ref 0.0–0.2)

## 2021-03-16 LAB — RESP PANEL BY RT-PCR (FLU A&B, COVID) ARPGX2
Influenza A by PCR: NEGATIVE
Influenza B by PCR: NEGATIVE
SARS Coronavirus 2 by RT PCR: NEGATIVE

## 2021-03-16 LAB — TROPONIN I (HIGH SENSITIVITY): Troponin I (High Sensitivity): 2 ng/L (ref <18)

## 2021-03-16 MED ORDER — AZITHROMYCIN 250 MG PO TABS
500.0000 mg | ORAL_TABLET | Freq: Once | ORAL | Status: AC
  Administered 2021-03-16: 500 mg via ORAL
  Filled 2021-03-16: qty 2

## 2021-03-16 MED ORDER — SODIUM CHLORIDE 0.9 % IV SOLN: 250.0000 mL | INTRAVENOUS | Status: DC | PRN

## 2021-03-16 MED ORDER — METHYLPREDNISOLONE SODIUM SUCC 40 MG IJ SOLR
40.0000 mg | Freq: Two times a day (BID) | INTRAMUSCULAR | Status: AC
  Administered 2021-03-17 (×2): 40 mg via INTRAVENOUS
  Filled 2021-03-16 (×2): qty 1

## 2021-03-16 MED ORDER — ACETAMINOPHEN 325 MG PO TABS
650.0000 mg | ORAL_TABLET | Freq: Four times a day (QID) | ORAL | Status: DC | PRN
  Administered 2021-03-18: 650 mg via ORAL
  Filled 2021-03-16: qty 2

## 2021-03-16 MED ORDER — SODIUM CHLORIDE 0.9% FLUSH: 3.0000 mL | INTRAVENOUS | Status: DC | PRN

## 2021-03-16 MED ORDER — ALBUTEROL SULFATE (2.5 MG/3ML) 0.083% IN NEBU
5.0000 mg | INHALATION_SOLUTION | Freq: Once | RESPIRATORY_TRACT | Status: AC
  Administered 2021-03-16: 5 mg via RESPIRATORY_TRACT
  Filled 2021-03-16: qty 6

## 2021-03-16 MED ORDER — HYDROCOD POLI-CHLORPHE POLI ER 10-8 MG/5ML PO SUER
5.0000 mL | Freq: Once | ORAL | Status: AC
  Administered 2021-03-16: 5 mL via ORAL
  Filled 2021-03-16: qty 5

## 2021-03-16 MED ORDER — METHYLPREDNISOLONE SODIUM SUCC 125 MG IJ SOLR
125.0000 mg | Freq: Once | INTRAMUSCULAR | Status: AC
  Administered 2021-03-16: 125 mg via INTRAVENOUS
  Filled 2021-03-16: qty 2

## 2021-03-16 MED ORDER — IPRATROPIUM-ALBUTEROL 0.5-2.5 (3) MG/3ML IN SOLN
3.0000 mL | Freq: Once | RESPIRATORY_TRACT | Status: AC
  Administered 2021-03-16: 3 mL via RESPIRATORY_TRACT
  Filled 2021-03-16: qty 3

## 2021-03-16 MED ORDER — ALBUTEROL SULFATE (2.5 MG/3ML) 0.083% IN NEBU
2.5000 mg | INHALATION_SOLUTION | RESPIRATORY_TRACT | Status: DC | PRN
  Administered 2021-03-17 (×2): 2.5 mg via RESPIRATORY_TRACT
  Filled 2021-03-16 (×2): qty 3

## 2021-03-16 MED ORDER — SODIUM CHLORIDE 0.9% FLUSH
3.0000 mL | Freq: Two times a day (BID) | INTRAVENOUS | Status: DC
  Administered 2021-03-16: 3 mL via INTRAVENOUS

## 2021-03-16 MED ORDER — DOXYCYCLINE HYCLATE 100 MG PO TABS
100.0000 mg | ORAL_TABLET | Freq: Two times a day (BID) | ORAL | Status: DC
  Filled 2021-03-16: qty 1

## 2021-03-16 MED ORDER — ALBUTEROL SULFATE (2.5 MG/3ML) 0.083% IN NEBU: 5.0000 mg | INHALATION_SOLUTION | Freq: Once | RESPIRATORY_TRACT | Status: DC

## 2021-03-16 MED ORDER — ACETAMINOPHEN 650 MG RE SUPP: 650.0000 mg | Freq: Four times a day (QID) | RECTAL | Status: DC | PRN

## 2021-03-16 MED ORDER — HYDROCODONE-ACETAMINOPHEN 5-325 MG PO TABS: 1.0000 | ORAL_TABLET | ORAL | Status: DC | PRN

## 2021-03-16 MED ORDER — GUAIFENESIN ER 600 MG PO TB12
600.0000 mg | ORAL_TABLET | Freq: Two times a day (BID) | ORAL | Status: DC
  Administered 2021-03-16: 600 mg via ORAL
  Filled 2021-03-16: qty 1

## 2021-03-16 MED ORDER — IPRATROPIUM BROMIDE 0.02 % IN SOLN: 0.5000 mg | Freq: Once | RESPIRATORY_TRACT | Status: DC

## 2021-03-16 MED ORDER — ALBUTEROL SULFATE (2.5 MG/3ML) 0.083% IN NEBU
5.0000 mg | INHALATION_SOLUTION | Freq: Once | RESPIRATORY_TRACT | Status: DC
Start: 2021-03-16 — End: 2021-03-16

## 2021-03-16 MED ORDER — IPRATROPIUM BROMIDE 0.02 % IN SOLN
0.5000 mg | Freq: Once | RESPIRATORY_TRACT | Status: AC
  Administered 2021-03-16: 0.5 mg via RESPIRATORY_TRACT
  Filled 2021-03-16: qty 2.5

## 2021-03-16 MED ORDER — ALBUTEROL SULFATE (2.5 MG/3ML) 0.083% IN NEBU
2.5000 mg | INHALATION_SOLUTION | Freq: Once | RESPIRATORY_TRACT | Status: AC
  Administered 2021-03-16: 2.5 mg via RESPIRATORY_TRACT
  Filled 2021-03-16: qty 3

## 2021-03-16 MED ORDER — IPRATROPIUM BROMIDE 0.02 % IN SOLN
0.5000 mg | Freq: Once | RESPIRATORY_TRACT | Status: DC
Start: 2021-03-16 — End: 2021-03-16

## 2021-03-16 MED ORDER — IPRATROPIUM-ALBUTEROL 0.5-2.5 (3) MG/3ML IN SOLN
3.0000 mL | Freq: Four times a day (QID) | RESPIRATORY_TRACT | Status: DC
  Administered 2021-03-17 – 2021-03-18 (×5): 3 mL via RESPIRATORY_TRACT
  Filled 2021-03-16 (×7): qty 3

## 2021-03-16 MED ORDER — PREDNISONE 20 MG PO TABS
40.0000 mg | ORAL_TABLET | Freq: Every day | ORAL | Status: DC
  Administered 2021-03-18: 40 mg via ORAL
  Filled 2021-03-16: qty 2

## 2021-03-16 NOTE — ED Triage Notes (Signed)
Patient arrives with daughter states patient has been sick for over a week now. Was seen at multiple places and diagnosed with various things. [facility] given hour long nebs and IV magnesium. Patient felt like she was improving but now has a tightness in her chest and shortness of breath is worsening. Patient speaking in full sentences.  ?

## 2021-03-16 NOTE — Subjective & Objective (Signed)
1-1/2 weeks ago started to have symptoms including fevers and cough and congestion associated some wheezing.  Was treated as an outpatient with prednisone and Augmentin.  Had an admission for acute asthma exacerbation and then was able to be discharged 2 days ago but continues to have wheezing dyspnea cough.  No evidence of hypoxia.  Presented back to withdrawal brain she was given a dose of Solu-Medrol nebulizer treatment COVID and flu negative chest x-ray with no evidence of pneumonia patient continues to have wheezing ?

## 2021-03-16 NOTE — ED Provider Notes (Addendum)
?[facility] EMERGENCY DEPT ?Provider Note ? ? ?CSN: [id number] ?Arrival date & time: 03/16/21  1521 ? ?  ? ?History ? ?Chief Complaint  ?Patient presents with  ? Shortness of Breath  ? ? ?[name] is a 71 y.o. female. ? ?Pt with hx asthma, with increased wheezing and sob for past 1-2 weeks. Symptoms acute onset with feeling as if having fever, and non prod cough, congestion, then increased wheezing/sob. Has been on augmentin for 5 days, and has been on oral steroids/prednisone. Non smoker. States was in ED in past couple days w plan for admission, but after prolonged ED stay wheezing improved enough to try to go home, but since then wheezing and sob persists. Chest has felt tight, but no discrete, episodic, or exertional chest pain. No pleuritic pain. No leg pain or swelling. No orthopnea.  ? ?The history is provided by the patient, medical records and a relative.  ?Shortness of Breath ?Associated symptoms: cough, fever and wheezing   ?Associated symptoms: no abdominal pain, no headaches, no neck pain, no rash, no sore throat and no vomiting   ? ?  ? ?Home Medications ?Prior to Admission medications   ?Medication Sig Start Date End Date Taking? Authorizing Provider  ?albuterol (ACCUNEB) 0.63 MG/3ML nebulizer solution Take 1 ampule by nebulization every 6 (six) hours as needed for wheezing or shortness of breath.  ?Patient not taking: Reported on 03/14/2021 02/15/15   [provider]  ?albuterol (VENTOLIN HFA) 108 (90 Base) MCG/ACT inhaler Inhale 2 puffs into the lungs every 6 (six) hours as needed for wheezing or shortness of breath. ?Patient not taking: Reported on 03/14/2021    [provider]  ?Alpha Lipoic Acid 200 MG CAPS 1.5 tablet    [provider]  ?Ascorbic Acid (VITAMIN C) 1000 MG tablet Take 1,000 mg by mouth daily.    [provider]  ?aspirin 81 MG EC tablet Take 81 mg by mouth See admin instructions. **pt takes twice a week**    [provider]  ?chlorpheniramine-HYDROcodone 10-8 MG/5ML Take 5 mLs by mouth at bedtime. 03/09/21   [provider]  ?cholecalciferol (VITAMIN D) 1000 UNITS tablet Take 1,000 Units by mouth 2 (two) times daily.    [provider]  ?cholecalciferol (VITAMIN D3) 25 MCG (1000 UNIT) tablet 1 tablet    [provider]  ?Cinnamon 500 MG capsule Take 500 mg by mouth 2 (two) times daily.    [provider]  ?Coenzyme Q10 (CO Q-10) 200 MG CAPS 1 capsule    [provider]  ?diclofenac (VOLTAREN) 75 MG EC tablet Take 1 tablet (75 mg total) by mouth 2 (two) times daily as needed for mild pain. 11/05/14   Pete Pelt, PA-C  ?docusate sodium (COLACE) 100 MG capsule Take 200 mg by mouth at bedtime.    [provider]  ?felodipine (PLENDIL) 10 MG 24 hr tablet Take 10 mg by mouth daily.    [provider]  ?FLAXSEED, LINSEED, PO Take 1,300 mg by mouth daily.     [provider]  ?Garlic 7619 MG CAPS Take 1,000 mg by mouth daily.    [provider]  ?Grape Seed 50 MG TABS Take 50 mg by mouth daily.    [provider]  ?guaiFENesin (MUCINEX) 600 MG 12 hr tablet Take 600 mg by mouth 2 (two) times daily.    [provider]  ?guaiFENesin-codeine 100-10 MG/5ML syrup SMARTSIG:10 Milliliter(s) By Mouth Every 8 Hours PRN ?  Patient not taking: Reported on 03/14/2021 03/08/21   [provider]  ?ipratropium-albuterol (DUONEB) 0.5-2.5 (3) MG/3ML SOLN Take 3 mLs by nebulization every 6 (six) hours as needed (shortness of breath).  ?Patient not taking: Reported on 03/14/2021    [provider]  ?irbesartan (AVAPRO) 300 MG tablet Take 300 mg by mouth daily.    [provider]  ?labetalol (NORMODYNE) 200 MG tablet Take 400 mg by mouth 2 (two) times daily.     [provider]  ?lactobacillus acidophilus (BACID) TABS tablet Take 2 tablets by mouth every morning.    [provider]  ?Lecithin 1200 MG CAPS Take 1,200 mg by  mouth daily.    [provider]  ?lidocaine-hydrocortisone (ANAMANTEL HC) 3-0.5 % CREA Place 1 Applicatorful rectally as needed (hemorrhoids).  ?Patient not taking: Reported on 03/14/2021    [provider]  ?Lutein 20 MG TABS Take 20 mg by mouth daily.  ?Patient not taking: Reported on 03/14/2021    [provider]  ?meclizine (ANTIVERT) 25 MG tablet 1 tablet as needed ?Patient not taking: Reported on 03/14/2021    [provider]  ?Multiple Vitamin (MULTIVITAMIN WITH MINERALS) TABS tablet Take 0.5 tablets by mouth 2 (two) times daily.     [provider]  ?Omega 3 1200 MG CAPS Take 1,200 mg by mouth 2 (two) times daily.    [provider]  ?ondansetron (ZOFRAN ODT) 4 MG disintegrating tablet Take 1 tablet (4 mg total) by mouth every 8 (eight) hours as needed for nausea or vomiting. ?Patient not taking: Reported on 03/14/2021 05/21/20   Caccavale, Sophia, PA-C  ?predniSONE (DELTASONE) 20 MG tablet Take 2 tablets (40 mg total) by mouth daily for 4 days. 03/14/21 03/18/21  Darliss Cheney, MD  ?simvastatin (ZOCOR) 20 MG tablet Take 20 mg by mouth at bedtime.     [provider]  ?sodium chloride (OCEAN) 0.65 % SOLN nasal spray Place 1 spray into both nostrils as needed for congestion.    [provider]  ?vitamin C (ASCORBIC ACID) 500 MG tablet Take 500 mg by mouth daily.    [provider]  ?   ? ?Allergies    ?Etodolac and Adhesive [tape]   ? ?Review of Systems   ?Review of Systems  ?Constitutional:  Positive for fever.  ?HENT:  Positive for congestion. Negative for sore throat.   ?Eyes:  Negative for redness.  ?Respiratory:  Positive for cough, shortness of breath and wheezing.   ?Cardiovascular:  Negative for leg swelling.  ?Gastrointestinal:  Negative for abdominal pain and vomiting.  ?Genitourinary:  Negative for flank pain.  ?Musculoskeletal:  Negative for back pain and neck pain.  ?Skin:  Negative for rash.  ?Neurological:  Negative for  headaches.  ?Hematological:  Does not bruise/bleed easily.  ?Psychiatric/Behavioral:  Negative for confusion.   ? ?Physical Exam ?Updated Vital Signs ?BP (!) 142/68   Pulse 64   Temp 98.4 ?F (36.9 ?C) (Oral)   Resp 16   Ht 1.575 m ('5\' 2"'$ )   Wt 101.2 kg   LMP 11/15/2015 Comment: spotting 11-15-15  SpO2 97%   BMI 40.79 kg/m?  ?Physical Exam ?Vitals and nursing note reviewed.  ?Constitutional:   ?   Appearance: Normal appearance. She is well-developed.  ?HENT:  ?   Head: Atraumatic.  ?   Nose: Nose normal.  ?   Mouth/Throat:  ?   Mouth: Mucous membranes are moist.  ?Eyes:  ?   General: No scleral icterus. ?  Conjunctiva/sclera: Conjunctivae normal.  ?Neck:  ?   Trachea: No tracheal deviation.  ?Cardiovascular:  ?   Rate and Rhythm: Normal rate and regular rhythm.  ?   Pulses: Normal pulses.  ?   Heart sounds: Normal heart sounds. No murmur heard. ?  No friction rub. No gallop.  ?Pulmonary:  ?   Effort: Respiratory distress present.  ?   Breath sounds: Wheezing present.  ?Abdominal:  ?   General: Bowel sounds are normal. There is no distension.  ?   Palpations: Abdomen is soft.  ?   Tenderness: There is no abdominal tenderness.  ?Genitourinary: ?   Comments: No cva tenderness.  ?Musculoskeletal:     ?   General: No swelling or tenderness.  ?   Cervical back: Normal range of motion and neck supple. No rigidity. No muscular tenderness.  ?   Right lower leg: No edema.  ?   Left lower leg: No edema.  ?Skin: ?   General: Skin is warm and dry.  ?   Findings: No rash.  ?Neurological:  ?   Mental Status: She is alert.  ?   Comments: Alert, speech normal.   ?Psychiatric:     ?   Mood and Affect: Mood normal.  ? ? ?ED Results / Procedures / Treatments   ?Labs ?(all labs ordered are listed, but only abnormal results are displayed) ?Results for orders placed or performed during the hospital encounter of 03/16/21  ?Resp Panel by RT-PCR (Flu A&B, Covid) Nasopharyngeal Swab  ? Specimen: Nasopharyngeal Swab; Nasopharyngeal(NP)  swabs in vial transport medium  ?Result Value Ref Range  ? SARS Coronavirus 2 by RT PCR NEGATIVE NEGATIVE  ? Influenza A by PCR NEGATIVE NEGATIVE  ? Influenza B by PCR NEGATIVE NEGATIVE  ?CBC  ?Result Value

## 2021-03-16 NOTE — Progress Notes (Signed)
Plan of Care Note for accepted transfer ? ? ?Patient: [name] MRN: [id number]   [place]: 03/16/2021 ? ?Facility requesting transfer: [facility] ?Requesting Provider: Dr. Lajean Saver ?Reason for transfer: Acute asthma exacerbation ?Facility course:  ?71 year old female with history of asthma.  She started having symptoms 1.5 weeks ago including fevers, cough, congestion, and wheezing.  She was treated with outpatient prednisone and Augmentin.  Subsequently admitted to the hospital for acute asthma exacerbation and discharged 2 days ago.  Continues to have wheezing, dyspnea, and cough.  Vital signs stable, not hypoxic.  Wheezing on exam and was given Solu-Medrol and multiple nebulizer treatments.  COVID and flu negative.  WBC 10.9.  Troponin negative.  BNP normal.  Chest x-ray showing no active cardiopulmonary disease. ? ?Plan of care: ?The patient is accepted for admission to Telemetry unit, at [facility]..  ? ?Author: ?Shela Leff, MD ?03/16/2021 ? ?Check [email or web address] for on-call coverage. ? ?Nursing staff, Please call [place] number on Amion as soon as patient's arrival, so appropriate admitting provider can evaluate the pt. ?

## 2021-03-16 NOTE — H&P (Signed)
[name] ZOX:[id number] DOB: 06-28-1950 DOA: 03/16/2021     PCP: Pcp, No   Outpatient Specialists:      Patient arrived to ER on 03/16/21 at 1521 Referred by Attending John Giovanni, MD   Patient coming from:    home Lives alone,    With family close by    Chief Complaint:   Chief Complaint  Patient presents with   Shortness of Breath    HPI: [name] is a 71 y.o. female with medical history significant of asthma and hypertension with recent admission  Presented with   wheezing and shortness of breath cough 1-1/2 weeks ago started to have symptoms including fevers and cough and congestion associated some wheezing.  Was treated as an outpatient with prednisone and Augmentin.  Had an admission for acute asthma exacerbation and then was able to be discharged 2 days ago but continues to have wheezing dyspnea cough.  No evidence of hypoxia.  Presented back to withdrawal brain she was given a dose of Solu-Medrol nebulizer treatment COVID and flu negative chest x-ray with no evidence of pneumonia patient continues to have wheezing Reports few weeks ago she had sinus pressure and drainage She had low grade fever COVID and flu RSV was all negative She noted that her pulse ox was reading 88% at home  Does not smoke no pleuritic pain no leg swelling Never smoked does not drink Tussinex helps She reports when she came home she was still feeling very bad  Reports severe fatigue and chest heaviness     Initial COVID TEST  NEGATIVE   Lab Results  Component Value Date   SARSCOV2NAA NEGATIVE 03/16/2021   SARSCOV2NAA NEGATIVE 03/14/2021   SARSCOV2NAA NEGATIVE 05/09/2020     Regarding pertinent Chronic problems:     Hyperlipidemia -  on statins Zocor     HTN on Avapro labetalol       Morbid obesity-   BMI Readings from Last 1 Encounters:  03/16/21 40.79 kg/m       Asthma -   on home inhalers/ nebs f                         last  admission   1 wk ago                        No  history of intubation    While in ER:   Persistent wheezing and coughing despite dose of Solu-Medrol and nebulizer treatments   Ordered    CXR -  NON acute    CTA chest -  non acute, no evidence of infiltrate  Following Medications were ordered in ER: Medications  methylPREDNISolone sodium succinate (SOLU-MEDROL) 125 mg/2 mL injection 125 mg (125 mg Intravenous Given 03/16/21 1703)  albuterol (PROVENTIL) (2.5 MG/3ML) 0.083% nebulizer solution 2.5 mg (2.5 mg Nebulization Given 03/16/21 1615)  ipratropium-albuterol (DUONEB) 0.5-2.5 (3) MG/3ML nebulizer solution 3 mL (3 mLs Nebulization Given 03/16/21 1615)  ipratropium (ATROVENT) nebulizer solution 0.5 mg (0.5 mg Nebulization Given 03/16/21 1715)  albuterol (PROVENTIL) (2.5 MG/3ML) 0.083% nebulizer solution 5 mg (5 mg Nebulization Given 03/16/21 1714)  azithromycin (ZITHROMAX) tablet 500 mg (500 mg Oral Given 03/16/21 1909)  chlorpheniramine-HYDROcodone 10-8 MG/5ML suspension 5 mL (5 mLs Oral Given 03/16/21 1910)  ipratropium-albuterol (DUONEB) 0.5-2.5 (3) MG/3ML nebulizer solution 3 mL (3 mLs Nebulization Given 03/16/21 1949)    ________    ED Triage Vitals  Enc Vitals Group     BP 03/16/21 1533 131/69     Pulse Rate 03/16/21 1533 72     Resp 03/16/21 1533 18     Temp 03/16/21 1537 98.4 F (36.9 C)     Temp Source 03/16/21 1537 Oral     SpO2 03/16/21 1533 95 %     Weight 03/16/21 1530 223 lb (101.2 kg)     Height 03/16/21 1530 5\' 2"  (1.575 m)     Head Circumference --      Peak Flow --      Pain Score 03/16/21 1530 4     Pain Loc --      Pain Edu? --      Excl. in GC? --   TMAX(24)@     _________________________________________ Significant initial  Findings: Abnormal Labs Reviewed  CBC - Abnormal; Notable for the following components:      Result Value   WBC 10.9 (*)    All other components within normal limits  COMPREHENSIVE METABOLIC PANEL - Abnormal; Notable for the following components:   Calcium 8.7  (*)    All other components within normal limits     _________________________ Troponin 2 ECG: Ordered Personally reviewed by me showing: HR : 67 Rhythm: NSR,   no evidence of ischemic changes QTC 423   The recent clinical data is shown below. Vitals:   03/16/21 1800 03/16/21 1830 03/16/21 2042 03/16/21 2237  BP: 125/64 (!) 142/68 (!) 144/81 (!) 161/86  Pulse: (!) 56 64 65 72  Resp: 15 16 18 16   Temp:    98.2 F (36.8 C)  TempSrc:      SpO2: 96% 97% 94% 96%  Weight:      Height:          WBC     Component Value Date/Time   WBC 10.9 (H) 03/16/2021 1600   LYMPHSABS 2.0 03/13/2021 1915   MONOABS 0.4 03/13/2021 1915   EOSABS 0.0 03/13/2021 1915   BASOSABS 0.0 03/13/2021 1915     Results for orders placed or performed during the hospital encounter of 03/16/21  Resp Panel by RT-PCR (Flu A&B, Covid) Nasopharyngeal Swab     Status: None   Collection Time: 03/16/21  4:01 PM   Specimen: Nasopharyngeal Swab; Nasopharyngeal(NP) swabs in vial transport medium  Result Value Ref Range Status   SARS Coronavirus 2 by RT PCR NEGATIVE NEGATIVE Final         Influenza A by PCR NEGATIVE NEGATIVE Final   Influenza B by PCR NEGATIVE NEGATIVE Final           _______________________________________________ Hospitalist was called for admission for asthma exacerbation  The following Work up has been ordered so far:  Orders Placed This Encounter  Procedures   Resp Panel by RT-PCR (Flu A&B, Covid) Nasopharyngeal Swab   DG Chest 2 View   CBC   Comprehensive metabolic panel   Brain natriuretic peptide   Cardiac monitoring   Consult to hospitalist   EKG 12-Lead   Place in observation (patient's expected length of stay will be less than 2 midnights)     OTHER Significant initial  Findings:  labs showing:    Recent Labs  Lab 03/13/21 1915 03/14/21 0317 03/16/21 1600  NA 140 141 143  K 4.1 4.2 3.7  CO2 28 26 30   GLUCOSE 136* 153* 98  BUN 15 15 20   CREATININE 0.69 0.70  0.81  CALCIUM 8.8* 8.4* 8.7*    Cr  stable,    Lab Results  Component Value Date   CREATININE 0.81 03/16/2021   CREATININE 0.70 03/14/2021   CREATININE 0.69 03/13/2021    Recent Labs  Lab 03/13/21 1915 03/16/21 1600  AST 21 18  ALT 21 20  ALKPHOS 74 71  BILITOT 0.5 0.5  PROT 6.3* 6.6  ALBUMIN 3.6 3.9   Lab Results  Component Value Date   CALCIUM 8.7 (L) 03/16/2021       Plt: Lab Results  Component Value Date   PLT 224 03/16/2021     COVID-19 Labs  No results for input(s): DDIMER, FERRITIN, LDH, CRP in the last 72 hours.  Lab Results  Component Value Date   SARSCOV2NAA NEGATIVE 03/16/2021   SARSCOV2NAA NEGATIVE 03/14/2021   SARSCOV2NAA NEGATIVE 05/09/2020     Venous  Blood Gas result:  pH 7.43  pCO2  42       Recent Labs  Lab 03/13/21 1915 03/14/21 0317 03/16/21 1600  WBC 8.8 11.2* 10.9*  NEUTROABS 6.2  --   --   HGB 14.5 14.6 14.3  HCT 45.6 44.5 44.9  MCV 92.3 91.6 90.0  PLT 213 214 224    HG/HCT  stable,     Component Value Date/Time   HGB 14.3 03/16/2021 1600   HCT 44.9 03/16/2021 1600   MCV 90.0 03/16/2021 1600     Cardiac Panel (last 3 results) No results for input(s): CKTOTAL, CKMB, TROPONINI, RELINDX in the last 72 hours.  .car BNP (last 3 results) Recent Labs    03/16/21 1600  BNP 90.9     DM  labs:  HbA1C: No results for input(s): HGBA1C in the last 8760 hours.     CBG (last 3)  No results for input(s): GLUCAP in the last 72 hours.    Cultures:    Component Value Date/Time   SDES URINE, CLEAN CATCH 11/12/2007 1402   SPECREQUEST NONE 11/12/2007 1402   CULT INSIGNIFICANT GROWTH 11/12/2007 1402   REPTSTATUS 11/14/2007 FINAL 11/12/2007 1402     Radiological Exams on Admission: DG Chest 2 View  Result Date: 03/16/2021 CLINICAL DATA:  Cough EXAM: CHEST - 2 VIEW COMPARISON:  03/13/2021 FINDINGS: The heart size and mediastinal contours are within normal limits. Both lungs are clear. The visualized skeletal structures  are unremarkable. IMPRESSION: No active cardiopulmonary disease. Electronically Signed   By: Charlett Nose M.D.   On: 03/16/2021 17:02   CT Angio Chest Pulmonary Embolism (PE) W or WO Contrast  Result Date: 03/17/2021 CLINICAL DATA:  Concern for pulmonary embolism. EXAM: CT ANGIOGRAPHY CHEST WITH CONTRAST TECHNIQUE: Multidetector CT imaging of the chest was performed using the standard protocol during bolus administration of intravenous contrast. Multiplanar CT image reconstructions and MIPs were obtained to evaluate the vascular anatomy. RADIATION DOSE REDUCTION: This exam was performed according to the departmental dose-optimization program which includes automated exposure control, adjustment of the mA and/or kV according to patient size and/or use of iterative reconstruction technique. CONTRAST:  OMNIPAQUE IOHEXOL 350 MG/ML SOLN COMPARISON:  Chest radiograph date 03/16/2021. FINDINGS: Cardiovascular: There is no cardiomegaly or pericardial effusion. Coronary vascular calcification of the LAD and RCA. Mild atherosclerotic calcification of the thoracic aorta. No aneurysmal dilatation or dissection. Evaluation of the pulmonary arteries is limited due to respiratory motion artifact. No pulmonary artery embolus identified. Mediastinum/Nodes: No hilar or mediastinal adenopathy. The esophagus is grossly unremarkable. No mediastinal fluid collection. Lungs/Pleura: Minimal bibasilar atelectasis. No focal consolidation, pleural effusion, or pneumothorax. The central airways are patent. Upper  Abdomen: No acute abnormality. Musculoskeletal: Osteopenia with degenerative changes of the spine. Lower cervical ACDF. No acute osseous pathology. Review of the MIP images confirms the above findings. IMPRESSION: 1. No acute intrathoracic pathology. No evidence of pulmonary artery embolus. 2. Aortic Atherosclerosis (ICD10-I70.0). Electronically Signed   By: Elgie Collard M.D.   On: 03/17/2021 01:26    _______________________________________________________________________________________________________ Latest  Blood pressure (!) 161/86, pulse 72, temperature 98.2 F (36.8 C), resp. rate 16, height 5\' 2"  (1.575 m), weight 101.2 kg, last menstrual period 11/15/2015, SpO2 96 %.   Vitals  labs and radiology finding personally reviewed  Review of Systems:    Pertinent positives include:  wheezing.dyspnea on exertion shortness of breath at rest Constitutional:  No weight loss, night sweats, Fevers, chills, fatigue, weight loss  HEENT:  No headaches, Difficulty swallowing,Tooth/dental problems,Sore throat,  No sneezing, itching, ear ache, nasal congestion, post nasal drip,  Cardio-vascular:  No chest pain, Orthopnea, PND, anasarca, dizziness, palpitations.no Bilateral lower extremity swelling  GI:  No heartburn, indigestion, abdominal pain, nausea, vomiting, diarrhea, change in bowel habits, loss of appetite, melena, blood in stool, hematemesis Resp:  no . No , No excess mucus, no productive cough, No non-productive cough, No coughing up of blood.No change in color of mucus.No  Skin:  no rash or lesions. No jaundice GU:  no dysuria, change in color of urine, no urgency or frequency. No straining to urinate.  No flank pain.  Musculoskeletal:  No joint pain or no joint swelling. No decreased range of motion. No back pain.  Psych:  No change in mood or affect. No depression or anxiety. No memory loss.  Neuro: no localizing neurological complaints, no tingling, no weakness, no double vision, no gait abnormality, no slurred speech, no confusion  All systems reviewed and apart from HOPI all are negative _______________________________________________________________________________________________ Past Medical History:   Past Medical History:  Diagnosis Date   Abnormal Pap smear of cervix 03/2009   Colpo Biopsy CIN I with HPV   Arthritis    Asthma    Bilateral edema of lower  extremity    Takes lasix if needed   Bronchitis    Disc disorder    bulging disc in thoracic area   GERD (gastroesophageal reflux disease)    occ   Hypercholesteremia    Hypertension since 1990's   Lumbar herniated disc    Obesity    Pneumonia    hx   Scoliosis    Spinal stenosis    Teeth grinding     Past Surgical History:  Procedure Laterality Date   APPENDECTOMY  1965   BACK SURGERY  1993   Thoracic and Lumbar   BREAST BIOPSY Right 1990   benign cyst   BREAST EXCISIONAL BIOPSY Right 1990   benign   CERVICAL DISC SURGERY  2007   COLONOSCOPY W/ POLYPECTOMY     HERNIA REPAIR  1977, as child   bilateral inguinal -1 during pregnancy.  Umbilical repair as child   KNEE ARTHROSCOPY Right 1990   LUMBAR DISC SURGERY  2006   SHOULDER ARTHROSCOPY Left 1994   arthritis, hips,fingers   TONSILLECTOMY AND ADENOIDECTOMY     TOTAL HIP ARTHROPLASTY Right 09/08/2014   Procedure: RIGHT TOTAL HIP ARTHROPLASTY ANTERIOR APPROACH;  Surgeon: Kathryne Hitch, MD;  Location: [facility] OR;  Service: Orthopedics;  Laterality: Right;   TOTAL HIP ARTHROPLASTY Left 11/02/2014   Procedure: LEFT TOTAL HIP ARTHROPLASTY ANTERIOR APPROACH;  Surgeon: Kathryne Hitch, MD;  Location: [facility] OR;  Service:  Orthopedics;  Laterality: Left;   TOTAL KNEE ARTHROPLASTY Right 2009   TOTAL KNEE ARTHROPLASTY Left 08/10/2015   Procedure: LEFT TOTAL KNEE ARTHROPLASTY;  Surgeon: Kathryne Hitch, MD;  Location: [facility] OR;  Service: Orthopedics;  Laterality: Left;    Social History:  Ambulatory   independently      reports that she has never smoked. She has never used smokeless tobacco. She reports that she does not currently use alcohol. She reports that she does not use drugs.    Family History:   Family History  Problem Relation Age of Onset   Breast cancer Sister 62       bilateral mastectomy- Breast cancer    Hypertension Mother    Heart disease Mother    Stroke Mother    Heart disease Father     Depression Father    Alcohol abuse Father    Stroke Maternal Grandmother    Emphysema Paternal Grandmother    Breast cancer Maternal Aunt        Both aunts in 60 -57's   Heart failure Paternal Grandfather    Alcohol abuse Paternal Grandfather    Depression Daughter    ______________________________________________________________________________________________ Allergies: Allergies  Allergen Reactions   Etodolac Other (See Comments)   Adhesive [Tape] Rash and Other (See Comments)    Blisters - reaction to adhesive on any type of bandages if stay long time     Prior to Admission medications   Medication Sig Start Date End Date Taking? Authorizing Provider  albuterol (ACCUNEB) 0.63 MG/3ML nebulizer solution Take 1 ampule by nebulization every 6 (six) hours as needed for wheezing or shortness of breath.  Patient not taking: Reported on 03/14/2021 02/15/15   [provider]  albuterol (VENTOLIN HFA) 108 (90 Base) MCG/ACT inhaler Inhale 2 puffs into the lungs every 6 (six) hours as needed for wheezing or shortness of breath. Patient not taking: Reported on 03/14/2021    [provider]  Alpha Lipoic Acid 200 MG CAPS 1.5 tablet    [provider]  Ascorbic Acid (VITAMIN C) 1000 MG tablet Take 1,000 mg by mouth daily.    [provider]  aspirin 81 MG EC tablet Take 81 mg by mouth See admin instructions. **pt takes twice a week**    [provider]  chlorpheniramine-HYDROcodone 10-8 MG/5ML Take 5 mLs by mouth at bedtime. 03/09/21   [provider]  cholecalciferol (VITAMIN D) 1000 UNITS tablet Take 1,000 Units by mouth 2 (two) times daily.    [provider]  cholecalciferol (VITAMIN D3) 25 MCG (1000 UNIT) tablet 1 tablet    [provider]  Cinnamon 500 MG capsule Take 500 mg by mouth 2 (two) times daily.    [provider]  Coenzyme Q10 (CO Q-10) 200 MG CAPS 1 capsule    [provider]  diclofenac  (VOLTAREN) 75 MG EC tablet Take 1 tablet (75 mg total) by mouth 2 (two) times daily as needed for mild pain. 11/05/14   Kirtland Bouchard, PA-C  docusate sodium (COLACE) 100 MG capsule Take 200 mg by mouth at bedtime.    [provider]  felodipine (PLENDIL) 10 MG 24 hr tablet Take 10 mg by mouth daily.    [provider]  FLAXSEED, LINSEED, PO Take 1,300 mg by mouth daily.     [provider]  Garlic 1000 MG CAPS Take 1,000 mg by mouth daily.    [provider]  Grape Seed 50 MG TABS Take  50 mg by mouth daily.    [provider]  guaiFENesin (MUCINEX) 600 MG 12 hr tablet Take 600 mg by mouth 2 (two) times daily.    [provider]  guaiFENesin-codeine 100-10 MG/5ML syrup SMARTSIG:10 Milliliter(s) By Mouth Every 8 Hours PRN Patient not taking: Reported on 03/14/2021 03/08/21   [provider]  ipratropium-albuterol (DUONEB) 0.5-2.5 (3) MG/3ML SOLN Take 3 mLs by nebulization every 6 (six) hours as needed (shortness of breath).  Patient not taking: Reported on 03/14/2021    [provider]  irbesartan (AVAPRO) 300 MG tablet Take 300 mg by mouth daily.    [provider]  labetalol (NORMODYNE) 200 MG tablet Take 400 mg by mouth 2 (two) times daily.     [provider]  lactobacillus acidophilus (BACID) TABS tablet Take 2 tablets by mouth every morning.    [provider]  Lecithin 1200 MG CAPS Take 1,200 mg by mouth daily.    [provider]  lidocaine-hydrocortisone (ANAMANTEL HC) 3-0.5 % CREA Place 1 Applicatorful rectally as needed (hemorrhoids).  Patient not taking: Reported on 03/14/2021    [provider]  Lutein 20 MG TABS Take 20 mg by mouth daily.  Patient not taking: Reported on 03/14/2021    [provider]  meclizine (ANTIVERT) 25 MG tablet 1 tablet as needed Patient not taking: Reported on 03/14/2021    [provider]  Multiple Vitamin (MULTIVITAMIN WITH  MINERALS) TABS tablet Take 0.5 tablets by mouth 2 (two) times daily.     [provider]  Omega 3 1200 MG CAPS Take 1,200 mg by mouth 2 (two) times daily.    [provider]  ondansetron (ZOFRAN ODT) 4 MG disintegrating tablet Take 1 tablet (4 mg total) by mouth every 8 (eight) hours as needed for nausea or vomiting. Patient not taking: Reported on 03/14/2021 05/21/20   Caccavale, Sophia, PA-C  predniSONE (DELTASONE) 20 MG tablet Take 2 tablets (40 mg total) by mouth daily for 4 days. 03/14/21 03/18/21  Hughie Closs, MD  simvastatin (ZOCOR) 20 MG tablet Take 20 mg by mouth at bedtime.     [provider]  sodium chloride (OCEAN) 0.65 % SOLN nasal spray Place 1 spray into both nostrils as needed for congestion.    [provider]  vitamin C (ASCORBIC ACID) 500 MG tablet Take 500 mg by mouth daily.    [provider]    ___________________________________________________________________________________________________ Physical Exam: Vitals with BMI 03/16/2021 03/16/2021 03/16/2021  Height - - -  Weight - - -  BMI - - -  Systolic 161 144 563  Diastolic 86 81 68  Pulse 72 65 64     1. General:  in No  Acute distress   Chronically ill   -appearing 2. Psychological: Alert and   Oriented 3. Head/ENT:   Dry Mucous Membranes                          Head Non traumatic, neck supple                      Poor Dentition 4. SKIN:  decreased Skin turgor,  Skin clean Dry and intact no rash 5. Heart: Regular rate and rhythm no  Murmur, no Rub or gallop 6. Lungs: occasional  wheezes or crackles   7. Abdomen: Soft,  non-tender, Non distended   obese  bowel sounds present 8. Lower extremities: no clubbing, cyanosis, no  edema 9. Neurologically Grossly intact, moving all 4 extremities equally   10. MSK: Normal range of motion    Chart has been  reviewed  ______________________________________________________________________________________________  Assessment/Plan 71 y.o. female with medical history significant of asthma and hypertension with recent admission  Admitted for asthma exacerbation  Present on Admission:  Acute asthma exacerbation  Essential hypertension, benign  Dyslipidemia    Acute asthma exacerbation  -  - Will initiate: Steroid taper  -  Antibiotics  Doxycycline for possible bronchitis, - Albuterol PRN, - scheduled duoneb,  -  Breo or Dulera at discharge   -  Mucinex.  Titrate O2 to saturation >90%. Follow patients respiratory status.   nfluenza PCR neg   VBG ordered   Currently mentating well no evidence of symptomatic hypercarbia CTA ordered given persistent dyspnea  Essential hypertension, benign Allow permissive HTN  Dyslipidemia Restart Zocor   Other plan as per orders.  DVT prophylaxis:  SCD      Code Status:    Code Status: Partial Code  DNI   as per patient   I had personally discussed CODE STATUS with patient     Family Communication:   Family not at  Bedside    Disposition Plan:  To home once workup is complete and patient is stable   Following barriers for discharge:                             Dyspnea improves              Consults called: none  Admission status:  ED Disposition     ED Disposition  Admit   Condition  --   Comment  Hospital Area: [facility] [[id number]]  Level of Care: Telemetry [5]  Admit to tele based on following criteria: Monitor for Ischemic changes  Interfacility transfer: Yes  May place patient in observation at [facility] or [facility] if equivalent level of care is available:: Yes  Covid Evaluation: Asymptomatic Screening Protocol (No Symptoms)  Diagnosis: Acute asthma exacerbation [[id number]]  Admitting Physician: John Giovanni [[id number]]  Attending Physician: Cathren Laine [[id number]]           Obs       Level of  care     tele  For 12H  Lab Results  Component Value Date   SARSCOV2NAA NEGATIVE 03/16/2021     Precautions: admitted as   Covid Negative       Zaiya Annunziato 03/17/2021, 2:17 AM    [facility]     after 2 AM please page floor coverage PA If 7AM-7PM, please contact the day team taking care of the patient using [email or web address]   Patient was evaluated in the context of the global COVID-19 pandemic, which necessitated consideration that the patient might be at risk for infection with the SARS-CoV-2 virus that causes COVID-19. Institutional protocols and algorithms that pertain to the evaluation of patients at risk for COVID-19 are in a state of rapid change based on information released by regulatory bodies including the CDC and federal and state organizations. These policies and algorithms were followed during the patient's care.

## 2021-03-17 ENCOUNTER — Observation Stay (HOSPITAL_COMMUNITY): Payer: Medicare Other

## 2021-03-17 DIAGNOSIS — J4541 Moderate persistent asthma with (acute) exacerbation: Secondary | ICD-10-CM | POA: Diagnosis not present

## 2021-03-17 DIAGNOSIS — J45901 Unspecified asthma with (acute) exacerbation: Secondary | ICD-10-CM | POA: Diagnosis present

## 2021-03-17 DIAGNOSIS — Z6841 Body Mass Index (BMI) 40.0 and over, adult: Secondary | ICD-10-CM | POA: Diagnosis not present

## 2021-03-17 DIAGNOSIS — Z20822 Contact with and (suspected) exposure to covid-19: Secondary | ICD-10-CM | POA: Diagnosis present

## 2021-03-17 DIAGNOSIS — R0602 Shortness of breath: Secondary | ICD-10-CM | POA: Diagnosis present

## 2021-03-17 DIAGNOSIS — K219 Gastro-esophageal reflux disease without esophagitis: Secondary | ICD-10-CM | POA: Diagnosis present

## 2021-03-17 DIAGNOSIS — Z888 Allergy status to other drugs, medicaments and biological substances status: Secondary | ICD-10-CM | POA: Diagnosis not present

## 2021-03-17 DIAGNOSIS — E7800 Pure hypercholesterolemia, unspecified: Secondary | ICD-10-CM | POA: Diagnosis present

## 2021-03-17 DIAGNOSIS — Z91048 Other nonmedicinal substance allergy status: Secondary | ICD-10-CM | POA: Diagnosis not present

## 2021-03-17 DIAGNOSIS — I700 Atherosclerosis of aorta: Secondary | ICD-10-CM | POA: Diagnosis not present

## 2021-03-17 DIAGNOSIS — I3139 Other pericardial effusion (noninflammatory): Secondary | ICD-10-CM | POA: Diagnosis not present

## 2021-03-17 DIAGNOSIS — Z8249 Family history of ischemic heart disease and other diseases of the circulatory system: Secondary | ICD-10-CM | POA: Diagnosis not present

## 2021-03-17 DIAGNOSIS — Z7982 Long term (current) use of aspirin: Secondary | ICD-10-CM | POA: Diagnosis not present

## 2021-03-17 DIAGNOSIS — Z79899 Other long term (current) drug therapy: Secondary | ICD-10-CM | POA: Diagnosis not present

## 2021-03-17 DIAGNOSIS — J9811 Atelectasis: Secondary | ICD-10-CM | POA: Diagnosis not present

## 2021-03-17 DIAGNOSIS — Z66 Do not resuscitate: Secondary | ICD-10-CM | POA: Diagnosis present

## 2021-03-17 DIAGNOSIS — R0982 Postnasal drip: Secondary | ICD-10-CM | POA: Diagnosis present

## 2021-03-17 DIAGNOSIS — R0609 Other forms of dyspnea: Secondary | ICD-10-CM | POA: Diagnosis not present

## 2021-03-17 DIAGNOSIS — Z96643 Presence of artificial hip joint, bilateral: Secondary | ICD-10-CM | POA: Diagnosis present

## 2021-03-17 DIAGNOSIS — Z96653 Presence of artificial knee joint, bilateral: Secondary | ICD-10-CM | POA: Diagnosis present

## 2021-03-17 DIAGNOSIS — I10 Essential (primary) hypertension: Secondary | ICD-10-CM | POA: Diagnosis present

## 2021-03-17 LAB — COMPREHENSIVE METABOLIC PANEL
ALT: 21 U/L (ref 0–44)
AST: 20 U/L (ref 15–41)
Albumin: 3.3 g/dL — ABNORMAL LOW (ref 3.5–5.0)
Alkaline Phosphatase: 64 U/L (ref 38–126)
Anion gap: 6 (ref 5–15)
BUN: 19 mg/dL (ref 8–23)
CO2: 28 mmol/L (ref 22–32)
Calcium: 8.1 mg/dL — ABNORMAL LOW (ref 8.9–10.3)
Chloride: 104 mmol/L (ref 98–111)
Creatinine, Ser: 0.65 mg/dL (ref 0.44–1.00)
GFR, Estimated: >60 mL/min (ref >60)
Glucose, Bld: 176 mg/dL — ABNORMAL HIGH (ref 70–99)
Potassium: 4.2 mmol/L (ref 3.5–5.1)
Sodium: 138 mmol/L (ref 135–145)
Total Bilirubin: 0.3 mg/dL (ref 0.3–1.2)
Total Protein: 5.7 g/dL — ABNORMAL LOW (ref 6.5–8.1)

## 2021-03-17 LAB — CBC WITH DIFFERENTIAL/PLATELET
Abs Immature Granulocytes: 0.07 10*3/uL (ref 0.00–0.07)
Basophils Absolute: 0 10*3/uL (ref 0.0–0.1)
Basophils Relative: 0 %
Eosinophils Absolute: 0 10*3/uL (ref 0.0–0.5)
Eosinophils Relative: 0 %
HCT: 42.9 % (ref 36.0–46.0)
Hemoglobin: 13.8 g/dL (ref 12.0–15.0)
Immature Granulocytes: 1 %
Lymphocytes Relative: 13 %
Lymphs Abs: 1.1 10*3/uL (ref 0.7–4.0)
MCH: 29.5 pg (ref 26.0–34.0)
MCHC: 32.2 g/dL (ref 30.0–36.0)
MCV: 91.7 fL (ref 80.0–100.0)
Monocytes Absolute: 0.2 10*3/uL (ref 0.1–1.0)
Monocytes Relative: 2 %
Neutro Abs: 7.3 10*3/uL (ref 1.7–7.7)
Neutrophils Relative %: 84 %
Platelets: 198 10*3/uL (ref 150–400)
RBC: 4.68 MIL/uL (ref 3.87–5.11)
RDW: 13.2 % (ref 11.5–15.5)
WBC: 8.7 10*3/uL (ref 4.0–10.5)
nRBC: 0 % (ref 0.0–0.2)

## 2021-03-17 LAB — BLOOD GAS, VENOUS
Acid-Base Excess: 3.2 mmol/L — ABNORMAL HIGH (ref 0.0–2.0)
Bicarbonate: 27.9 mmol/L (ref 20.0–28.0)
Drawn by: 35529
O2 Saturation: 80.9 %
Patient temperature: 36.9
pCO2, Ven: 42 mmHg — ABNORMAL LOW (ref 44–60)
pH, Ven: 7.43 (ref 7.25–7.43)
pO2, Ven: 46 mmHg — ABNORMAL HIGH (ref 32–45)

## 2021-03-17 LAB — ECHOCARDIOGRAM COMPLETE
AR max vel: 1.65 cm2
AV Area VTI: 1.97 cm2
AV Area mean vel: 1.53 cm2
AV Mean grad: 7 mmHg
AV Peak grad: 15.8 mmHg
Ao pk vel: 1.99 m/s
Area-P 1/2: 3.03 cm2
Height: 62 in
S' Lateral: 2.5 cm
Single Plane A4C EF: 76.4 %
Weight: 3568 oz

## 2021-03-17 LAB — RESPIRATORY PANEL BY PCR

## 2021-03-17 LAB — MRSA NEXT GEN BY PCR, NASAL: MRSA by PCR Next Gen: DETECTED — AB

## 2021-03-17 LAB — TSH: TSH: 0.507 u[IU]/mL (ref 0.350–4.500)

## 2021-03-17 LAB — PHOSPHORUS: Phosphorus: 4.1 mg/dL (ref 2.5–4.6)

## 2021-03-17 LAB — MAGNESIUM: Magnesium: 2.3 mg/dL (ref 1.7–2.4)

## 2021-03-17 MED ORDER — FELODIPINE ER 10 MG PO TB24
10.0000 mg | ORAL_TABLET | Freq: Every day | ORAL | Status: DC
  Administered 2021-03-17 – 2021-03-18 (×2): 10 mg via ORAL
  Filled 2021-03-17 (×2): qty 1

## 2021-03-17 MED ORDER — SODIUM CHLORIDE (PF) 0.9 % IJ SOLN
INTRAMUSCULAR | Status: AC
Start: 2021-03-17 — End: 2021-03-17
  Filled 2021-03-17: qty 50

## 2021-03-17 MED ORDER — SODIUM CHLORIDE 0.9 % IV SOLN: 250.0000 mL | INTRAVENOUS | Status: DC | PRN

## 2021-03-17 MED ORDER — LABETALOL HCL 200 MG PO TABS
400.0000 mg | ORAL_TABLET | Freq: Two times a day (BID) | ORAL | Status: DC
  Administered 2021-03-17 – 2021-03-18 (×3): 400 mg via ORAL
  Filled 2021-03-17 (×3): qty 2

## 2021-03-17 MED ORDER — ASPIRIN EC 81 MG PO TBEC
81.0000 mg | DELAYED_RELEASE_TABLET | ORAL | Status: DC
  Administered 2021-03-17: 81 mg via ORAL
  Filled 2021-03-17: qty 1

## 2021-03-17 MED ORDER — SIMVASTATIN 10 MG PO TABS
20.0000 mg | ORAL_TABLET | Freq: Every day | ORAL | Status: DC
  Administered 2021-03-17: 20 mg via ORAL
  Filled 2021-03-17: qty 2

## 2021-03-17 MED ORDER — MENTHOL 3 MG MT LOZG: 1.0000 | LOZENGE | OROMUCOSAL | Status: DC | PRN

## 2021-03-17 MED ORDER — DOCUSATE SODIUM 100 MG PO CAPS
200.0000 mg | ORAL_CAPSULE | Freq: Every day | ORAL | Status: DC
  Administered 2021-03-17: 200 mg via ORAL
  Filled 2021-03-17: qty 2

## 2021-03-17 MED ORDER — GUAIFENESIN ER 600 MG PO TB12
1200.0000 mg | ORAL_TABLET | Freq: Two times a day (BID) | ORAL | Status: DC
Start: 2021-03-17 — End: 2021-03-18
  Administered 2021-03-17 – 2021-03-18 (×3): 1200 mg via ORAL
  Filled 2021-03-17 (×3): qty 2

## 2021-03-17 MED ORDER — HYDROCOD POLI-CHLORPHE POLI ER 10-8 MG/5ML PO SUER: 5.0000 mL | Freq: Every day | ORAL | Status: DC | PRN

## 2021-03-17 MED ORDER — IOHEXOL 350 MG/ML SOLN
100.0000 mL | Freq: Once | INTRAVENOUS | Status: AC | PRN
  Administered 2021-03-17: 100 mL via INTRAVENOUS

## 2021-03-17 MED ORDER — AZITHROMYCIN 250 MG PO TABS
250.0000 mg | ORAL_TABLET | Freq: Every day | ORAL | Status: DC
  Administered 2021-03-17 – 2021-03-18 (×2): 250 mg via ORAL
  Filled 2021-03-17 (×2): qty 1

## 2021-03-17 MED ORDER — HYDROCOD POLI-CHLORPHE POLI ER 10-8 MG/5ML PO SUER
5.0000 mL | Freq: Every day | ORAL | Status: DC
  Administered 2021-03-17: 5 mL via ORAL
  Filled 2021-03-17: qty 5

## 2021-03-17 MED ORDER — HYDROCOD POLI-CHLORPHE POLI ER 10-8 MG/5ML PO SUER
5.0000 mL | Freq: Every day | ORAL | Status: DC | PRN
  Filled 2021-03-17: qty 5

## 2021-03-17 MED ORDER — IRBESARTAN 150 MG PO TABS
300.0000 mg | ORAL_TABLET | Freq: Every day | ORAL | Status: DC
  Administered 2021-03-17 – 2021-03-18 (×2): 300 mg via ORAL
  Filled 2021-03-17 (×2): qty 2

## 2021-03-17 MED ORDER — GUAIFENESIN-DM 100-10 MG/5ML PO SYRP: 5.0000 mL | ORAL_SOLUTION | ORAL | Status: DC | PRN

## 2021-03-17 MED ORDER — SIMVASTATIN 10 MG PO TABS: 20.0000 mg | ORAL_TABLET | Freq: Every day | ORAL | Status: DC

## 2021-03-17 NOTE — Progress Notes (Signed)
?[facility] Progress Note ? ?Patient: [name]     ?JJH:[id number]  ?DOA: 03/16/2021   ?  ?  ?Brief hospital course: ?This is a 71 y/o female with asthma and HTN who was discharged from [facility] on 3/13 after being treated for an asthma exacerbation. She and a pulse ox in the 90s with ambulation on the day of dc and minimal wheezing. She was discharged on Prednisone. She states that she felt short of breath again at home and depite using her Nebs it did not improved.  ?In the ED her symptoms improved but it was felt that as they did not completely resolve, she should be admitted. She was not hypoxic. A CTA of the chest was negative for PE, for infiltrates, plum edema and effusion- it was essentially normal.  ? ?Subjective:  ?States she feels better this AM. Thinks the Azithromycin may have helped.  ? ?Assessment and Plan: ? ? ? ?Principal Problem: ?  Acute asthma exacerbation ? - had significant rhonchi this AM and her main concern was inability to cough the sputum up ?- she states she has never had this severe of a flare  ?- resp virus panel is negative ?- cont Z pack, Nebs and Mucinex ?- cont Duonebs Q 6 routine and Albuterol every 2 PRN ?- add Flutter valve to help with expectoration ?- she is drinking fluids well and I have encourage her to maintain hydration ? ?Active Problems: ?  Essential hypertension, benign ?- cont Irbesartan & Labetalol ? ?  Dyslipidemia ?- cont Zocor ? ?  Morbid obesity ([place]) ?Body mass index is 40.79 kg/m?. ? ? ? ? ? ?DVT prophylaxis:  SCDs Start: 03/16/21 2301 ?  ?  Code Status: Partial Code  ?Level of Care: Level of care: Telemetry ?Disposition Plan:  ?Status is: Observation ?The patient will require care spanning > 2 midnights and should be moved to inpatient because: asthma exacerbation ? ?Objective: ?  ?Vitals:  ? 03/17/21 0740 03/17/21 1042 03/17/21 1043 03/17/21 1130  ?BP:  140/67 140/67 137/69  ?Pulse:  66 64 78  ?Resp:    20  ?Temp:    98.2 ?F (36.8 ?C)  ?TempSrc:     Oral  ?SpO2: 90%   93%  ?Weight:      ?Height:      ? ?Filed Weights  ? 03/16/21 1530  ?Weight: 101.2 kg  ? ?Exam: ?General exam: Appears comfortable  ?HEENT: PERRLA, oral mucosa moist, no sclera icterus or thrush ?Respiratory system: extensive rhonchi and mild wheezing, severe cough ?Cardiovascular system: S1 & S2 heard, regular rate and rhythm ?Gastrointestinal system: Abdomen soft, non-tender, nondistended. Normal bowel sounds   ?Central nervous system: Alert and oriented. No focal neurological deficits. ?Extremities: No cyanosis, clubbing or edema ?Skin: No rashes or ulcers ?Psychiatry:  Mood & affect appropriate.   ? ?Imaging and lab data was personally reviewed ? ? ? CBC: ?Recent Labs  ?Lab 03/13/21 ?1915 03/14/21 ?8185 03/16/21 ?1600 03/17/21 ?0507  ?WBC 8.8 11.2* 10.9* 8.7  ?NEUTROABS 6.2  --   --  7.3  ?HGB 14.5 14.6 14.3 13.8  ?HCT 45.6 44.5 44.9 42.9  ?MCV 92.3 91.6 90.0 91.7  ?PLT 213 214 224 198  ? ?Basic Metabolic Panel: ?Recent Labs  ?Lab 03/13/21 ?1915 03/14/21 ?6314 03/16/21 ?1600 03/17/21 ?0507  ?NA 140 141 143 138  ?K 4.1 4.2 3.7 4.2  ?CL 103 106 105 104  ?CO2 '28 26 30 28  '$ ?GLUCOSE 136* 153* 98 176*  ?BUN  $'15 15 20 19  'D$ ?CREATININE 0.69 0.70 0.81 0.65  ?CALCIUM 8.8* 8.4* 8.7* 8.1*  ?MG  --   --   --  2.3  ?PHOS  --   --   --  4.1  ? ?GFR: ?Estimated Creatinine Clearance: 72.8 mL/min (by C-G formula based on SCr of 0.65 mg/dL). ? ?Scheduled Meds: ? aspirin EC  81 mg Oral Once per day on Mon Thu  ? azithromycin  250 mg Oral Daily  ? chlorpheniramine-HYDROcodone  5 mL Oral QHS  ? felodipine  10 mg Oral Daily  ? guaiFENesin  1,200 mg Oral BID  ? ipratropium-albuterol  3 mL Nebulization Q6H  ? irbesartan  300 mg Oral Daily  ? labetalol  400 mg Oral BID  ? methylPREDNISolone (SOLU-MEDROL) injection  40 mg Intravenous Q12H  ? Followed by  ? [START ON 03/18/2021] predniSONE  40 mg Oral Q breakfast  ? simvastatin  20 mg Oral QHS  ? ?Continuous Infusions: ? ? LOS: 0 days  ? ?Author: ?Debbe Odea   ?03/17/2021 3:48 PM ?   ?

## 2021-03-17 NOTE — Assessment & Plan Note (Signed)
Restart Zocor ?

## 2021-03-17 NOTE — Progress Notes (Signed)
Oxygen Saturation with ambulation:  ? ?Patient Saturations on Room Air at Rest = 96% ? ?Patient Saturations on Room Air while Ambulating = 92% ?

## 2021-03-17 NOTE — Assessment & Plan Note (Signed)
-  -   Will initiate: Steroid taper ? -  Antibiotics  Doxycycline for possible bronchitis, ?- Albuterol PRN, ?- scheduled duoneb, ? -  Breo or Dulera at discharge ?  -  Mucinex.  ?Titrate O2 to saturation >90%. Follow patients respiratory status. ?  nfluenza PCR neg ?  VBG ordered  ? Currently mentating well no evidence of symptomatic hypercarbia ?CTA ordered given persistent dyspnea ?

## 2021-03-17 NOTE — Progress Notes (Signed)
? ?  Echocardiogram ?2D Echocardiogram has been performed. ? ?[name] ?03/17/2021, 3:15 PM ?

## 2021-03-17 NOTE — Assessment & Plan Note (Signed)
Allow permissive HTN 

## 2021-03-18 ENCOUNTER — Telehealth: Payer: Self-pay | Admitting: Pulmonary Disease

## 2021-03-18 DIAGNOSIS — I10 Essential (primary) hypertension: Secondary | ICD-10-CM

## 2021-03-18 DIAGNOSIS — J4541 Moderate persistent asthma with (acute) exacerbation: Secondary | ICD-10-CM | POA: Diagnosis not present

## 2021-03-18 DIAGNOSIS — R0982 Postnasal drip: Secondary | ICD-10-CM

## 2021-03-18 MED ORDER — PSYLLIUM 95 % PO PACK
1.0000 | PACK | Freq: Every day | ORAL | Status: DC
  Filled 2021-03-18: qty 1

## 2021-03-18 MED ORDER — IPRATROPIUM-ALBUTEROL 0.5-2.5 (3) MG/3ML IN SOLN: 3.0000 mL | Freq: Four times a day (QID) | RESPIRATORY_TRACT | 0 refills | Status: AC | PRN

## 2021-03-18 MED ORDER — AZITHROMYCIN 250 MG PO TABS: ORAL_TABLET | ORAL | 0 refills | Status: AC

## 2021-03-18 MED ORDER — AZELASTINE HCL 0.1 % NA SOLN
2.0000 | Freq: Two times a day (BID) | NASAL | Status: DC
  Administered 2021-03-18: 2 via NASAL
  Filled 2021-03-18: qty 30

## 2021-03-18 MED ORDER — AZELASTINE HCL 0.1 % NA SOLN: 2.0000 | Freq: Two times a day (BID) | NASAL | 0 refills | Status: AC

## 2021-03-18 MED ORDER — PREDNISONE 10 MG PO TABS: ORAL_TABLET | ORAL | 0 refills | Status: AC

## 2021-03-18 NOTE — Telephone Encounter (Signed)
Called patient but her daughter answered the phone. She will call our office back once she get her mother situated at home.  ? ?Will route to the front desk pool for follow up when she calls back.  ?

## 2021-03-18 NOTE — Consult Note (Addendum)
? ?NAME:  [name], MRN:  [id number], DOB:  02/06/1950, LOS: 1 ?ADMISSION DATE:  03/16/2021, CONSULTATION DATE: 03/18/2021 ?REFERRING MD: Dr. Wynelle Cleveland CHIEF COMPLAINT: Asthma exacerbation ? ?History of Present Illness:  ?Patient with a history of asthma, recently discharged from [facility] following treatment for asthma exacerbation ?Breathing just never got back to usual ?Has had a history of asthma for about 10 years, did not have a label of asthma growing up ?Used to get a lot of bronchitis ?Both parents smoked ?Was never smoker, no occupational predisposition to lung disease ? ?She returned to the emergency department secondary to some worsening symptoms, chest tightness.  Was recently treated for asthma exacerbation with a course of Augmentin and prednisone ? ?Activity level have remained about the same, no exercise desaturations, does not feel her breathing is limiting activity levels ? ?She does have chronic sinus congestion some/fullness ?-Saw Dr. Benjamine Mola, plan is for CT sinuses at some point ? ?Pertinent  Medical History  ? ?Past Medical History:  ?Diagnosis Date  ? Abnormal Pap smear of cervix 03/2009  ? Colpo Biopsy CIN I with HPV  ? Arthritis   ? Asthma   ? Bilateral edema of lower extremity   ? Takes lasix if needed  ? Bronchitis   ? Disc disorder   ? bulging disc in thoracic area  ? GERD (gastroesophageal reflux disease)   ? occ  ? Hypercholesteremia   ? Hypertension since 1990's  ? Lumbar herniated disc   ? Obesity   ? Pneumonia   ? hx  ? Scoliosis   ? Spinal stenosis   ? Teeth grinding   ? ? ? ?Significant Hospital Events: ?Including procedures, antibiotic start and stop dates in addition to other pertinent events   ?CT scan of the chest reviewed showing no significant infiltrative process 3/16 ?Respiratory viral panel 3/15-normal ? ?Interim History / Subjective:  ?Breathing feels slightly better ?Still concerned about the chest tightness ?Was bringing up mucus during recent  hospitalization, never had a fever ? ?Objective   ?Blood pressure (!) 177/78, pulse 66, temperature (!) 97.4 ?F (36.3 ?C), temperature source Oral, resp. rate 15, height '5\' 2"'$  (1.575 m), weight 101.2 kg, last menstrual period 11/15/2015, SpO2 96 %. ?   ?   ?No intake or output data in the 24 hours ending 03/18/21 1127 ?Filed Weights  ? 03/16/21 1530  ?Weight: 101.2 kg  ? ? ?Examination: ?General: Elderly, does not appear to be in distress ?HENT: Moist oral mucosa ?Lungs: Some rhonchi ?Cardiovascular: S1-S2 appreciated ?Abdomen: Soft, bowel sounds appreciated ?Extremities: No clubbing, no edema ?Neuro: Alert and oriented x3 ?GU: Fair output ? ?CT scan of the chest on 03/17/2021 was reviewed by myself ?Echocardiogram 03/17/2021-ejection fraction of 60 to 65%, normal right-sided pressures ? ?Resolved Hospital Problem list   ? ? ?Assessment & Plan:  ?Asthma exacerbation ?-Bronchodilators ?-Steroids ?-Complete course of antibiotics ? ?She does have some chronic sinus fullness/congestion ?-Denies significant sensations of postnasal drip, no rhinorrhea ? ?-She was using Mucinex at home ?-I think she is just not making enough secretions to expectorate ?-Appropriate to continue Mucinex short-term ?-Flutter device may help secretions as well ? ?With a normal CT scan, no fevers, no significant leukocytosis, no functional limitations, no significant breathing difficulty with activity ?-I think it safe to discharge her home to complete course of antibiotics, steroids-tapering steroids, and I will follow up as outpatient with a PFT and further evaluation as needed. ? ?History of osteoarthritis, multiple back surgeries,  hip surgery, knee surgeries ?-Does not appear to be limited from the effects of this ?-Symptoms at baseline ? ?Morbid obesity ? ?Discussed with Dr. Wynelle Cleveland ? ? ? ?Best Practice (right click and "Reselect all SmartList Selections" daily)  ? ?As per primary ? ?Labs   ?CBC: ?Recent Labs  ?Lab 03/13/21 ?1915  03/14/21 ?7341 03/16/21 ?1600 03/17/21 ?0507  ?WBC 8.8 11.2* 10.9* 8.7  ?NEUTROABS 6.2  --   --  7.3  ?HGB 14.5 14.6 14.3 13.8  ?HCT 45.6 44.5 44.9 42.9  ?MCV 92.3 91.6 90.0 91.7  ?PLT 213 214 224 198  ? ? ?Basic Metabolic Panel: ?Recent Labs  ?Lab 03/13/21 ?1915 03/14/21 ?9379 03/16/21 ?1600 03/17/21 ?0507  ?NA 140 141 143 138  ?K 4.1 4.2 3.7 4.2  ?CL 103 106 105 104  ?CO2 '28 26 30 28  '$ ?GLUCOSE 136* 153* 98 176*  ?BUN '15 15 20 19  '$ ?CREATININE 0.69 0.70 0.81 0.65  ?CALCIUM 8.8* 8.4* 8.7* 8.1*  ?MG  --   --   --  2.3  ?PHOS  --   --   --  4.1  ? ?GFR: ?Estimated Creatinine Clearance: 72.8 mL/min (by C-G formula based on SCr of 0.65 mg/dL). ?Recent Labs  ?Lab 03/13/21 ?1915 03/14/21 ?0240 03/16/21 ?1600 03/17/21 ?0507  ?WBC 8.8 11.2* 10.9* 8.7  ? ? ?Liver Function Tests: ?Recent Labs  ?Lab 03/13/21 ?1915 03/16/21 ?1600 03/17/21 ?0507  ?AST '21 18 20  '$ ?ALT '21 20 21  '$ ?ALKPHOS 74 71 64  ?BILITOT 0.5 0.5 0.3  ?PROT 6.3* 6.6 5.7*  ?ALBUMIN 3.6 3.9 3.3*  ? ?No results for input(s): LIPASE, AMYLASE in the last 168 hours. ?No results for input(s): AMMONIA in the last 168 hours. ? ?ABG ?   ?Component Value Date/Time  ? HCO3 27.9 03/16/2021 2332  ? O2SAT 80.9 03/16/2021 2332  ?  ? ?Coagulation Profile: ?No results for input(s): INR, PROTIME in the last 168 hours. ? ?Cardiac Enzymes: ?No results for input(s): CKTOTAL, CKMB, CKMBINDEX, TROPONINI in the last 168 hours. ? ?HbA1C: ?No results found for: HGBA1C ? ?CBG: ?No results for input(s): GLUCAP in the last 168 hours. ? ?Review of Systems:   ?Chest congestion, chest tightness, cough with no significant expectoration ? ?Past Medical History:  ?She,  has a past medical history of Abnormal Pap smear of cervix (03/2009), Arthritis, Asthma, Bilateral edema of lower extremity, Bronchitis, Disc disorder, GERD (gastroesophageal reflux disease), Hypercholesteremia, Hypertension (since 1990's), Lumbar herniated disc, Obesity, Pneumonia, Scoliosis, Spinal stenosis, and Teeth grinding.   ? ?Surgical History:  ? ?Past Surgical History:  ?Procedure Laterality Date  ? APPENDECTOMY  1965  ? [place]  ? Thoracic and Lumbar  ? BREAST BIOPSY Right 1990  ? benign cyst  ? BREAST EXCISIONAL BIOPSY Right 1990  ? benign  ? [place] SURGERY  2007  ? COLONOSCOPY W/ POLYPECTOMY    ? [place], as child  ? bilateral inguinal -1 during pregnancy.  Umbilical repair as child  ? KNEE ARTHROSCOPY Right 1990  ? [place] SURGERY  2006  ? SHOULDER ARTHROSCOPY Left 1994  ? arthritis, hips,fingers  ? TONSILLECTOMY AND ADENOIDECTOMY    ? TOTAL HIP ARTHROPLASTY Right 09/08/2014  ? Procedure: RIGHT TOTAL HIP ARTHROPLASTY ANTERIOR APPROACH;  Surgeon: Mcarthur Rossetti, MD;  Location: [place];  Service: Orthopedics;  Laterality: Right;  ? TOTAL HIP ARTHROPLASTY Left 11/02/2014  ? Procedure: LEFT TOTAL HIP ARTHROPLASTY ANTERIOR APPROACH;  Surgeon: Mcarthur Rossetti, MD;  Location: [place];  Service: Orthopedics;  Laterality: Left;  ? TOTAL KNEE ARTHROPLASTY Right 2009  ? TOTAL KNEE ARTHROPLASTY Left 08/10/2015  ? Procedure: LEFT TOTAL KNEE ARTHROPLASTY;  Surgeon: Mcarthur Rossetti, MD;  Location: [place];  Service: Orthopedics;  Laterality: Left;  ?  ? ?Social History:  ? reports that she has never smoked. She has never used smokeless tobacco. She reports that she does not currently use alcohol. She reports that she does not use drugs.  ? ?Family History:  ?Her family history includes Alcohol abuse in her father and paternal grandfather; Breast cancer in her maternal aunt; Breast cancer (age of onset: 69) in her sister; Depression in her daughter and father; Emphysema in her paternal grandmother; Heart disease in her father and mother; Heart failure in her paternal grandfather; Hypertension in her mother; Stroke in her maternal grandmother and mother.  ? ?Allergies ?Allergies  ?Allergen Reactions  ? Etodolac Other (See Comments)  ? Adhesive [Tape] Rash and Other (See Comments)  ?  Blisters -  reaction to adhesive on any type of bandages if stay long time  ?  ? ?Home Medications  ?Prior to Admission medications   ?Medication Sig Start Date End Date Taking? Authorizing Provider  ?albuterol (VENTOLIN HFA) 108 (90 Base) MCG/ACT in

## 2021-03-18 NOTE — Discharge Summary (Signed)
Physician Discharge Summary  ?[name] ZDG:[id number] DOB: 1950/06/25 DOA: 03/16/2021 ? ?PCP: Jonathon Jordan, MD ? ?Admit date: 03/16/2021 ?Discharge date: 03/18/2021 ?Discharging to: home ?Recommendations for Outpatient Follow-up:  ?F/u with pulm, Dr Ander Slade ? ?Consults:  ?pulm ?Procedures:  ? none ? ? ?Discharge Diagnoses:  ? Principal Problem: ?  Acute asthma exacerbation ?Active Problems: ?  Essential hypertension, benign ?  Dyslipidemia ?  Morbid obesity ([place]) ?  PND ? ? ? ? ?Hospital Course: ?This is a 71 y/o female with asthma and HTN who was discharged from [facility] on 3/13 after being treated for an asthma exacerbation. She and a pulse ox in the 90s with ambulation on the day of dc and minimal wheezing. She was discharged on Prednisone. She states that she felt short of breath again at home and depite using her Nebs it did not improved.  ? ?In the ED her symptoms improved but it was felt that as they did not completely resolve, she should be admitted. She was not hypoxic. A CTA of the chest was negative for PE, for infiltrates, plum edema and effusion- it was essentially normal.  ? ?Admitted to continue to treat asthma exacerbation ?  ? ?Assessment and Plan: ?Principal Problem: ?  Acute asthma exacerbation ? - having significant rhonchi and her main concern was inability to cough the sputum up ?- diagnosed with asthma by her PA many years ago and has been treated for flares with nebs, antibiotics and steroids in the past ?- she states she has never had this severe of a flare  ?- resp virus panel is negative ?- given Z pack, continued on Nebs and Mucinex ?- received Duonebs Q 6 routine and Albuterol every 2 PRN ?- added Flutter valve to help with expectoration ?- she is drinking fluids well and I have encourage her to maintain hydration ?- obtained pulm eval today- recommended a prednisone taper, finish off antibiotics and f/u as outpt ? ?Post nasal drip ?- I have started astelin ?- she has been  following with Dr Benjamine Mola who plans a CT of her sinuses ?  ?Active Problems: ?  Essential hypertension, benign ?- cont Irbesartan & Labetalol ?  ?  Dyslipidemia ?- cont Zocor ?  ?  Morbid obesity ([place]) ?Body mass index is 40.79 kg/m? ? ? ? ? ?  ? ? ?  ? ?Discharge Instructions ? ?Discharge Instructions   ? ? Diet - low sodium heart healthy   Complete by: As directed ?  ? Increase activity slowly   Complete by: As directed ?  ? ?  ? ?Allergies as of 03/18/2021   ? ?   Reactions  ? Etodolac Other (See Comments)  ? Adhesive [tape] Rash, Other (See Comments)  ? Blisters - reaction to adhesive on any type of bandages if stay long time  ? ?  ? ?  ?Medication List  ?  ? ?TAKE these medications   ? ?albuterol 108 (90 Base) MCG/ACT inhaler ?Commonly known as: VENTOLIN HFA ?Inhale 2 puffs into the lungs every 6 (six) hours as needed for wheezing or shortness of breath. ?  ?Alpha Lipoic Acid 200 MG Caps ?Take 200 mg by mouth daily. ?  ?aspirin 81 MG EC tablet ?Take 81 mg by mouth See admin instructions. **pt takes twice a week** Monday and Thursday ?  ?azelastine 0.1 % nasal spray ?Commonly known as: ASTELIN ?Place 2 sprays into both nostrils 2 (two) times daily. Use in each nostril as directed ?  ?  azithromycin 250 MG tablet ?Commonly known as: ZITHROMAX ?Daily tomorrow and then the following day ?Start taking on: March 19, 2021 ?  ?chlorpheniramine-HYDROcodone 10-8 MG/5ML ?Take 5 mLs by mouth at bedtime. ?  ?cholecalciferol 1000 units tablet ?Commonly known as: VITAMIN D ?Take 1,000 Units by mouth 2 (two) times daily. ?  ?Cinnamon 500 MG capsule ?Take 500 mg by mouth 2 (two) times daily. ?  ?Co Q-10 200 MG Caps ?Take 200 mg by mouth every evening. ?  ?diclofenac 75 MG EC tablet ?Commonly known as: VOLTAREN ?Take 1 tablet (75 mg total) by mouth 2 (two) times daily as needed for mild pain. ?  ?docusate sodium 100 MG capsule ?Commonly known as: COLACE ?Take 200 mg by mouth at bedtime. ?  ?felodipine 10 MG 24 hr tablet ?Commonly  known as: PLENDIL ?Take 10 mg by mouth daily. ?  ?FLAXSEED (LINSEED) PO ?Take 1,300 mg by mouth daily. ?  ?Garlic 4098 MG Caps ?Take 1,000 mg by mouth daily. ?  ?Grape Seed 50 MG Tabs ?Take 50 mg by mouth daily. ?  ?Guaifenesin 1200 MG Tb12 ?Take 1,200 mg by mouth 2 (two) times daily. ?  ?ipratropium-albuterol 0.5-2.5 (3) MG/3ML Soln ?Commonly known as: DUONEB ?Take 3 mLs by nebulization every 6 (six) hours as needed (shortness of breath). ?  ?irbesartan 300 MG tablet ?Commonly known as: AVAPRO ?Take 300 mg by mouth daily. ?  ?labetalol 200 MG tablet ?Commonly known as: NORMODYNE ?Take 400 mg by mouth 2 (two) times daily. ?  ?lactobacillus acidophilus Tabs tablet ?Take 1 tablet by mouth every morning. ?  ?Lecithin 1200 MG Caps ?Take 1,200 mg by mouth daily. ?  ?Lutein 20 MG Tabs ?Take 20 mg by mouth daily. ?  ?meclizine 25 MG tablet ?Commonly known as: ANTIVERT ?  ?multivitamin with minerals Tabs tablet ?Take 0.5 tablets by mouth 2 (two) times daily. ?  ?Omega 3 1200 MG Caps ?Take 1,200 mg by mouth 2 (two) times daily. ?  ?predniSONE 10 MG tablet ?Commonly known as: DELTASONE ?Take 3 tablets (30 mg total) by mouth daily for 3 days, THEN 2 tablets (20 mg total) daily for 3 days, THEN 1 tablet (10 mg total) daily for 3 days, THEN 1 tablet (10 mg total) every other day for 4 days. ?Start taking on: March 18, 2021 ?What changed:  ?medication strength ?See the new instructions. ?  ?simvastatin 20 MG tablet ?Commonly known as: ZOCOR ?Take 20 mg by mouth at bedtime. ?  ?sodium chloride 0.65 % Soln nasal spray ?Commonly known as: OCEAN ?Place 1 spray into both nostrils as needed for congestion. ?  ?vitamin C 500 MG tablet ?Commonly known as: ASCORBIC ACID ?Take 500 mg by mouth daily. ?  ? ?  ? ? ?  ?  ?The results of significant diagnostics from this hospitalization (including imaging, microbiology, ancillary and laboratory) are listed below for reference.   ? ?DG Chest 2 View ? ?Result Date: 03/16/2021 ?CLINICAL DATA:   Cough EXAM: CHEST - 2 VIEW COMPARISON:  03/13/2021 FINDINGS: The heart size and mediastinal contours are within normal limits. Both lungs are clear. The visualized skeletal structures are unremarkable. IMPRESSION: No active cardiopulmonary disease. Electronically Signed   By: Rolm Baptise M.D.   On: 03/16/2021 17:02  ? ?DG Chest 2 View ? ?Result Date: 03/13/2021 ?CLINICAL DATA:  Shortness of breath EXAM: CHEST - 2 VIEW COMPARISON:  03/09/2020 FINDINGS: Heart and mediastinal contours are within normal limits. No focal opacities or effusions. No acute bony abnormality.  IMPRESSION: No active cardiopulmonary disease. Electronically Signed   By: Rolm Baptise M.D.   On: 03/13/2021 19:48  ? ?CT Angio Chest Pulmonary Embolism (PE) W or WO Contrast ? ?Result Date: 03/17/2021 ?CLINICAL DATA:  Concern for pulmonary embolism. EXAM: CT ANGIOGRAPHY CHEST WITH CONTRAST TECHNIQUE: Multidetector CT imaging of the chest was performed using the standard protocol during bolus administration of intravenous contrast. Multiplanar CT image reconstructions and MIPs were obtained to evaluate the vascular anatomy. RADIATION DOSE REDUCTION: This exam was performed according to the departmental dose-optimization program which includes automated exposure control, adjustment of the mA and/or kV according to patient size and/or use of iterative reconstruction technique. CONTRAST:  196m OMNIPAQUE IOHEXOL 350 MG/ML SOLN COMPARISON:  Chest radiograph date 03/16/2021. FINDINGS: Cardiovascular: There is no cardiomegaly or pericardial effusion. Coronary vascular calcification of the LAD and RCA. Mild atherosclerotic calcification of the thoracic aorta. No aneurysmal dilatation or dissection. Evaluation of the pulmonary arteries is limited due to respiratory motion artifact. No pulmonary artery embolus identified. Mediastinum/Nodes: No hilar or mediastinal adenopathy. The esophagus is grossly unremarkable. No mediastinal fluid collection. Lungs/Pleura:  Minimal bibasilar atelectasis. No focal consolidation, pleural effusion, or pneumothorax. The central airways are patent. Upper Abdomen: No acute abnormality. Musculoskeletal: Osteopenia with degenerative changes of th

## 2021-04-01 ENCOUNTER — Other Ambulatory Visit: Payer: Medicare Other

## 2021-04-01 DIAGNOSIS — J208 Acute bronchitis due to other specified organisms: Secondary | ICD-10-CM | POA: Diagnosis not present

## 2021-04-01 DIAGNOSIS — R0602 Shortness of breath: Secondary | ICD-10-CM | POA: Diagnosis not present

## 2021-04-11 NOTE — Telephone Encounter (Signed)
Called and left pt a voicemail 04/11/21..st ?

## 2021-05-19 DIAGNOSIS — Z03818 Encounter for observation for suspected exposure to other biological agents ruled out: Secondary | ICD-10-CM | POA: Diagnosis not present

## 2021-05-19 DIAGNOSIS — R509 Fever, unspecified: Secondary | ICD-10-CM | POA: Diagnosis not present

## 2021-05-19 DIAGNOSIS — R5383 Other fatigue: Secondary | ICD-10-CM | POA: Diagnosis not present

## 2021-05-19 DIAGNOSIS — J208 Acute bronchitis due to other specified organisms: Secondary | ICD-10-CM | POA: Diagnosis not present

## 2021-05-26 DIAGNOSIS — J4540 Moderate persistent asthma, uncomplicated: Secondary | ICD-10-CM | POA: Diagnosis not present

## 2021-05-26 DIAGNOSIS — J208 Acute bronchitis due to other specified organisms: Secondary | ICD-10-CM | POA: Diagnosis not present

## 2021-06-20 DIAGNOSIS — M158 Other polyosteoarthritis: Secondary | ICD-10-CM | POA: Diagnosis not present

## 2021-06-20 DIAGNOSIS — Z23 Encounter for immunization: Secondary | ICD-10-CM | POA: Diagnosis not present

## 2021-06-20 DIAGNOSIS — M5136 Other intervertebral disc degeneration, lumbar region: Secondary | ICD-10-CM | POA: Diagnosis not present

## 2021-06-20 DIAGNOSIS — E559 Vitamin D deficiency, unspecified: Secondary | ICD-10-CM | POA: Diagnosis not present

## 2021-06-20 DIAGNOSIS — J4540 Moderate persistent asthma, uncomplicated: Secondary | ICD-10-CM | POA: Diagnosis not present

## 2021-06-20 DIAGNOSIS — I10 Essential (primary) hypertension: Secondary | ICD-10-CM | POA: Diagnosis not present

## 2021-06-20 DIAGNOSIS — E785 Hyperlipidemia, unspecified: Secondary | ICD-10-CM | POA: Diagnosis not present

## 2021-06-20 DIAGNOSIS — Z0000 Encounter for general adult medical examination without abnormal findings: Secondary | ICD-10-CM | POA: Diagnosis not present

## 2021-06-20 DIAGNOSIS — R7303 Prediabetes: Secondary | ICD-10-CM | POA: Diagnosis not present

## 2021-07-07 ENCOUNTER — Ambulatory Visit
Admission: RE | Admit: 2021-07-07 | Discharge: 2021-07-07 | Disposition: A | Discharge status: Home / Self Care | Payer: Medicare Other | Source: Ambulatory Visit | Attending: Family Medicine | Admitting: Family Medicine

## 2021-07-07 DIAGNOSIS — Z1231 Encounter for screening mammogram for malignant neoplasm of breast: Secondary | ICD-10-CM | POA: Diagnosis not present

## 2021-07-12 DIAGNOSIS — H2513 Age-related nuclear cataract, bilateral: Secondary | ICD-10-CM | POA: Diagnosis not present

## 2021-08-15 DIAGNOSIS — M79672 Pain in left foot: Secondary | ICD-10-CM | POA: Diagnosis not present

## 2021-09-24 DIAGNOSIS — F331 Major depressive disorder, recurrent, moderate: Secondary | ICD-10-CM | POA: Diagnosis not present

## 2021-09-24 DIAGNOSIS — J0110 Acute frontal sinusitis, unspecified: Secondary | ICD-10-CM | POA: Diagnosis not present

## 2021-09-24 DIAGNOSIS — J45901 Unspecified asthma with (acute) exacerbation: Secondary | ICD-10-CM | POA: Diagnosis not present

## 2021-09-29 DIAGNOSIS — J4541 Moderate persistent asthma with (acute) exacerbation: Secondary | ICD-10-CM | POA: Diagnosis not present

## 2021-10-15 DIAGNOSIS — Z23 Encounter for immunization: Secondary | ICD-10-CM | POA: Diagnosis not present

## 2021-10-20 DIAGNOSIS — Z85828 Personal history of other malignant neoplasm of skin: Secondary | ICD-10-CM | POA: Diagnosis not present

## 2021-10-20 DIAGNOSIS — L570 Actinic keratosis: Secondary | ICD-10-CM | POA: Diagnosis not present

## 2021-11-26 DIAGNOSIS — F331 Major depressive disorder, recurrent, moderate: Secondary | ICD-10-CM | POA: Diagnosis not present

## 2022-02-07 ENCOUNTER — Other Ambulatory Visit: Payer: Self-pay | Admitting: Family Medicine

## 2022-02-07 DIAGNOSIS — Z1231 Encounter for screening mammogram for malignant neoplasm of breast: Secondary | ICD-10-CM

## 2022-02-14 DIAGNOSIS — Z85828 Personal history of other malignant neoplasm of skin: Secondary | ICD-10-CM | POA: Diagnosis not present

## 2022-02-14 DIAGNOSIS — D485 Neoplasm of uncertain behavior of skin: Secondary | ICD-10-CM | POA: Diagnosis not present

## 2022-02-14 DIAGNOSIS — L814 Other melanin hyperpigmentation: Secondary | ICD-10-CM | POA: Diagnosis not present

## 2022-02-14 DIAGNOSIS — L821 Other seborrheic keratosis: Secondary | ICD-10-CM | POA: Diagnosis not present

## 2022-02-14 DIAGNOSIS — D2239 Melanocytic nevi of other parts of face: Secondary | ICD-10-CM | POA: Diagnosis not present

## 2022-02-14 DIAGNOSIS — L738 Other specified follicular disorders: Secondary | ICD-10-CM | POA: Diagnosis not present

## 2022-02-14 DIAGNOSIS — L720 Epidermal cyst: Secondary | ICD-10-CM | POA: Diagnosis not present

## 2022-02-14 DIAGNOSIS — D225 Melanocytic nevi of trunk: Secondary | ICD-10-CM | POA: Diagnosis not present

## 2022-02-27 DIAGNOSIS — J40 Bronchitis, not specified as acute or chronic: Secondary | ICD-10-CM | POA: Diagnosis not present

## 2022-02-27 DIAGNOSIS — H6693 Otitis media, unspecified, bilateral: Secondary | ICD-10-CM | POA: Diagnosis not present

## 2022-02-27 DIAGNOSIS — R051 Acute cough: Secondary | ICD-10-CM | POA: Diagnosis not present

## 2022-02-27 DIAGNOSIS — M79671 Pain in right foot: Secondary | ICD-10-CM | POA: Diagnosis not present

## 2022-02-28 ENCOUNTER — Encounter (HOSPITAL_BASED_OUTPATIENT_CLINIC_OR_DEPARTMENT_OTHER): Payer: Self-pay

## 2022-02-28 ENCOUNTER — Emergency Department (HOSPITAL_BASED_OUTPATIENT_CLINIC_OR_DEPARTMENT_OTHER)
Admission: EM | Admit: 2022-02-28 | Discharge: 2022-02-28 | Disposition: A | Discharge status: Home / Self Care | Payer: Medicare Other | Attending: Emergency Medicine | Admitting: Emergency Medicine

## 2022-02-28 ENCOUNTER — Emergency Department (HOSPITAL_BASED_OUTPATIENT_CLINIC_OR_DEPARTMENT_OTHER): Payer: Medicare Other | Admitting: Radiology

## 2022-02-28 ENCOUNTER — Other Ambulatory Visit: Payer: Self-pay

## 2022-02-28 DIAGNOSIS — Z79899 Other long term (current) drug therapy: Secondary | ICD-10-CM | POA: Diagnosis not present

## 2022-02-28 DIAGNOSIS — R0602 Shortness of breath: Secondary | ICD-10-CM

## 2022-02-28 DIAGNOSIS — R0981 Nasal congestion: Secondary | ICD-10-CM | POA: Diagnosis not present

## 2022-02-28 DIAGNOSIS — R509 Fever, unspecified: Secondary | ICD-10-CM | POA: Diagnosis not present

## 2022-02-28 DIAGNOSIS — Z7982 Long term (current) use of aspirin: Secondary | ICD-10-CM | POA: Diagnosis not present

## 2022-02-28 DIAGNOSIS — H6093 Unspecified otitis externa, bilateral: Secondary | ICD-10-CM | POA: Diagnosis not present

## 2022-02-28 DIAGNOSIS — J45909 Unspecified asthma, uncomplicated: Secondary | ICD-10-CM | POA: Insufficient documentation

## 2022-02-28 DIAGNOSIS — Z20822 Contact with and (suspected) exposure to covid-19: Secondary | ICD-10-CM | POA: Insufficient documentation

## 2022-02-28 DIAGNOSIS — I10 Essential (primary) hypertension: Secondary | ICD-10-CM | POA: Insufficient documentation

## 2022-02-28 DIAGNOSIS — H60503 Unspecified acute noninfective otitis externa, bilateral: Secondary | ICD-10-CM

## 2022-02-28 LAB — TROPONIN I (HIGH SENSITIVITY)
Troponin I (High Sensitivity): 2 ng/L (ref <18)
Troponin I (High Sensitivity): <2 ng/L (ref <18)

## 2022-02-28 LAB — BASIC METABOLIC PANEL
Anion gap: 9 (ref 5–15)
BUN: 16 mg/dL (ref 8–23)
CO2: 24 mmol/L (ref 22–32)
Calcium: 9.2 mg/dL (ref 8.9–10.3)
Chloride: 109 mmol/L (ref 98–111)
Creatinine, Ser: 0.59 mg/dL (ref 0.44–1.00)
GFR, Estimated: >60 mL/min (ref >60)
Glucose, Bld: 153 mg/dL — ABNORMAL HIGH (ref 70–99)
Potassium: 4.3 mmol/L (ref 3.5–5.1)
Sodium: 142 mmol/L (ref 135–145)

## 2022-02-28 LAB — CBC
HCT: 43.3 % (ref 36.0–46.0)
Hemoglobin: 14 g/dL (ref 12.0–15.0)
MCH: 29.3 pg (ref 26.0–34.0)
MCHC: 32.3 g/dL (ref 30.0–36.0)
MCV: 90.6 fL (ref 80.0–100.0)
Platelets: 219 10*3/uL (ref 150–400)
RBC: 4.78 MIL/uL (ref 3.87–5.11)
RDW: 12.8 % (ref 11.5–15.5)
WBC: 7.9 10*3/uL (ref 4.0–10.5)
nRBC: 0 % (ref 0.0–0.2)

## 2022-02-28 LAB — RESP PANEL BY RT-PCR (RSV, FLU A&B, COVID)  RVPGX2
Influenza A by PCR: NEGATIVE
Influenza B by PCR: NEGATIVE
Resp Syncytial Virus by PCR: NEGATIVE
SARS Coronavirus 2 by RT PCR: NEGATIVE

## 2022-02-28 MED ORDER — MAGNESIUM SULFATE 2 GM/50ML IV SOLN
2.0000 g | Freq: Once | INTRAVENOUS | Status: AC
  Administered 2022-02-28: 2 g via INTRAVENOUS
  Filled 2022-02-28: qty 50

## 2022-02-28 MED ORDER — IPRATROPIUM-ALBUTEROL 0.5-2.5 (3) MG/3ML IN SOLN
RESPIRATORY_TRACT | Status: AC
Start: 2022-02-28 — End: 2022-02-28
  Administered 2022-02-28: 6 mL
  Filled 2022-02-28: qty 6

## 2022-02-28 MED ORDER — MAGNESIUM SULFATE 50 % IJ SOLN
1.0000 g | Freq: Once | INTRAMUSCULAR | Status: DC
Start: 2022-02-28 — End: 2022-02-28

## 2022-02-28 MED ORDER — ALBUTEROL SULFATE (2.5 MG/3ML) 0.083% IN NEBU
10.0000 mg/h | INHALATION_SOLUTION | RESPIRATORY_TRACT | Status: DC
  Administered 2022-02-28: 10 mg/h via RESPIRATORY_TRACT
  Filled 2022-02-28: qty 12

## 2022-02-28 MED ORDER — LEVOFLOXACIN 500 MG PO TABS: 500.0000 mg | ORAL_TABLET | Freq: Every day | ORAL | 0 refills | Status: AC

## 2022-02-28 MED ORDER — DEXAMETHASONE SODIUM PHOSPHATE 10 MG/ML IJ SOLN
10.0000 mg | Freq: Once | INTRAMUSCULAR | Status: AC
  Administered 2022-02-28: 10 mg via INTRAVENOUS
  Filled 2022-02-28: qty 1

## 2022-02-28 MED ORDER — CIPROFLOXACIN-DEXAMETHASONE 0.3-0.1 % OT SUSP: 4.0000 [drp] | Freq: Two times a day (BID) | OTIC | 0 refills | Status: AC

## 2022-02-28 MED ORDER — IPRATROPIUM-ALBUTEROL 0.5-2.5 (3) MG/3ML IN SOLN: 3.0000 mL | RESPIRATORY_TRACT | Status: DC

## 2022-02-28 MED ORDER — GUAIFENESIN 100 MG/5ML PO LIQD
15.0000 mL | Freq: Once | ORAL | Status: AC
Start: 2022-02-28 — End: 2022-02-28
  Administered 2022-02-28: 15 mL via ORAL
  Filled 2022-02-28: qty 20

## 2022-02-28 NOTE — ED Notes (Signed)
Provider requested to hold ambulation until Mag is fully given.Marland KitchenMarland Kitchen

## 2022-02-28 NOTE — ED Triage Notes (Signed)
Patient here POV from Home.  Endorses Cold-Like Symptoms for approximately 5-6 Days such as Productive Cough.   Became SOB for the Past 4 Hours prompting ED Evaluation. CP Yesterday. Upper back Pain today.   99.8 Temperature at Home.   NAD Noted during Triage. A&Ox4. GCS 15. Ambulatory.

## 2022-02-28 NOTE — ED Provider Notes (Signed)
Patient care taken over at shift handoff from previous provider please see his note for further details  In short, 72 year old female patient with history of scoliosis, hypertension, GERD, asthma, bronchitis presented to the emergency department for evaluation of shortness of breath.  Patient was seen by PCP yesterday and placed on Augmentin and steroids.  At time of assumption of care plan was for magnesium due to patient's shortness of breath followed by ambulation while on pulse ox.  Patient passed ambulatory test patient was to be discharged home.  Otherwise consider admission Physical Exam  BP (!) 109/52 (BP Location: Right Arm)   Pulse (!) 111 Comment: Max with ambulation  Temp 99 F (37.2 C) (Oral)   Resp (!) 24   Ht '5\' 2"'$  (1.575 m)   Wt 101.2 kg   LMP 11/15/2015 Comment: spotting 11-15-15  SpO2 92%   BMI 40.81 kg/m   Physical Exam  Procedures  Procedures  ED Course / MDM    Medical Decision Making Amount and/or Complexity of Data Reviewed Labs: ordered. Radiology: ordered.  Risk OTC drugs. Prescription drug management.   Patient's oxygen saturation maintained throughout ambulation into the very end when she coughed and desatted to 89%.  She sat down and quickly returned to 95+ percent SpO2.  I discussed admission with the patient versus discharge home.  The patient would like to discharge home which seems reasonable.  She is rhonchorous upon exam with some mild expiratory wheezes in the left lower lobe.  The patient states that in the past she has been seen for this, admitted for this, and follow-up as an outpatient.  She states when this happened before they have tried other antibiotics but that Levaquin has been the only outpatient antibiotic that has worked for her.  I discussed the risks of Levaquin usage with the patient and the patient like to proceed with a Levaquin prescription which seems reasonable.  I also provided a Ciprodex prescription for otitis externa.   Patient states she has had ear drainage for over a week.  She states that she has no ear pressure.  TMs were clear to auscultation bilaterally.  Patient to discharge home with strict return precautions.  These would include any worsening shortness of breath, difficulty with ambulation, chest pain.  Patient also plans to follow-up with her primary care provider as an outpatient       Ronny Bacon 02/28/22 2140    Horton, Alvin Critchley, DO 03/01/22 0004

## 2022-02-28 NOTE — ED Provider Notes (Signed)
[facility] Provider Note   CSN: [id number] Arrival date & time: 02/28/22  1453     History  Chief Complaint  Patient presents with   Shortness of Breath    [name] is a 72 y.o. female with medical history of scoliosis, hypertension, GERD, asthma, bronchitis.  Patient presents to ED for evaluation of shortness of breath.  Patient states that on Wednesday or Thursday of last week she developed URI type symptoms to include chest congestion, cough, fever.  Patient reports that the symptoms continued into the weekend.  Patient reports that she was seen by her PCP yesterday and placed on Augmentin as well as steroids.  The patient cannot tell me why she was placed on these medications or what her PCP was attempting to address.  The patient reports that she was advised by her PCP that if she does not feel better this morning to present to ED for evaluation.  Patient is endorsing shortness of breath, cough, fever.  Patient denies any nausea, vomiting, diarrhea, abdominal pain, chest pain, lightheadedness or dizziness.  Patient also worsening wheezing on examination, is audibly wheezing throughout the course of the interview.   Shortness of Breath      Home Medications Prior to Admission medications   Medication Sig Start Date End Date Taking? Authorizing Provider  albuterol (VENTOLIN HFA) 108 (90 Base) MCG/ACT inhaler Inhale 2 puffs into the lungs every 6 (six) hours as needed for wheezing or shortness of breath.    [provider]  Alpha Lipoic Acid 200 MG CAPS Take 200 mg by mouth daily.    [provider]  aspirin 81 MG EC tablet Take 81 mg by mouth See admin instructions. **pt takes twice a week** Monday and Thursday    [provider]  azelastine (ASTELIN) 0.1 % nasal spray Place 2 sprays into both nostrils 2 (two) times daily. Use in each nostril as directed 03/18/21   Debbe Odea, MD   chlorpheniramine-HYDROcodone 10-8 MG/5ML Take 5 mLs by mouth at bedtime. 03/09/21   [provider]  cholecalciferol (VITAMIN D) 1000 UNITS tablet Take 1,000 Units by mouth 2 (two) times daily.    [provider]  Cinnamon 500 MG capsule Take 500 mg by mouth 2 (two) times daily.    [provider]  Coenzyme Q10 (CO Q-10) 200 MG CAPS Take 200 mg by mouth every evening.    [provider]  diclofenac (VOLTAREN) 75 MG EC tablet Take 1 tablet (75 mg total) by mouth 2 (two) times daily as needed for mild pain. 11/05/14   Pete Pelt, PA-C  docusate sodium (COLACE) 100 MG capsule Take 200 mg by mouth at bedtime.    [provider]  felodipine (PLENDIL) 10 MG 24 hr tablet Take 10 mg by mouth daily.    [provider]  FLAXSEED, LINSEED, PO Take 1,300 mg by mouth daily.     [provider]  Garlic 123XX123 MG CAPS Take 1,000 mg by mouth daily.    [provider]  Grape Seed 50 MG TABS Take 50 mg by mouth daily.    [provider]  Guaifenesin 1200 MG TB12 Take 1,200 mg by mouth 2 (two) times daily.    [provider]  ipratropium-albuterol (DUONEB) 0.5-2.5 (3) MG/3ML SOLN Take 3 mLs by nebulization every 6 (six) hours as needed (shortness of breath). 03/18/21 04/17/21  Debbe Odea, MD  irbesartan (AVAPRO) 300 MG tablet Take 300  mg by mouth daily.    [provider]  labetalol (NORMODYNE) 200 MG tablet Take 400 mg by mouth 2 (two) times daily.     [provider]  lactobacillus acidophilus (BACID) TABS tablet Take 1 tablet by mouth every morning.    [provider]  Lecithin 1200 MG CAPS Take 1,200 mg by mouth daily.    [provider]  Lutein 20 MG TABS Take 20 mg by mouth daily.    [provider]  meclizine (ANTIVERT) 25 MG tablet     [provider]  Multiple Vitamin (MULTIVITAMIN WITH MINERALS) TABS tablet Take 0.5 tablets by mouth 2 (two) times daily.      [provider]  Omega 3 1200 MG CAPS Take 1,200 mg by mouth 2 (two) times daily.    [provider]  simvastatin (ZOCOR) 20 MG tablet Take 20 mg by mouth at bedtime.     [provider]  sodium chloride (OCEAN) 0.65 % SOLN nasal spray Place 1 spray into both nostrils as needed for congestion.    [provider]  vitamin C (ASCORBIC ACID) 500 MG tablet Take 500 mg by mouth daily.    [provider]      Allergies    Etodolac and Adhesive [tape]    Review of Systems   Review of Systems  Respiratory:  Positive for shortness of breath.   All other systems reviewed and are negative.   Physical Exam Updated Vital Signs BP (!) 109/52 (BP Location: Right Arm)   Pulse 73   Temp 99 F (37.2 C) (Oral)   Resp (!) 24   Ht '5\' 2"'$  (1.575 m)   Wt 101.2 kg   LMP 11/15/2015 Comment: spotting 11-15-15  SpO2 95%   BMI 40.81 kg/m  Physical Exam  ED Results / Procedures / Treatments   Labs (all labs ordered are listed, but only abnormal results are displayed) Labs Reviewed  BASIC METABOLIC PANEL - Abnormal; Notable for the following components:      Result Value   Glucose, Bld 153 (*)    All other components within normal limits  RESP PANEL BY RT-PCR (RSV, FLU A&B, COVID)  RVPGX2  CBC  TROPONIN I (HIGH SENSITIVITY)  TROPONIN I (HIGH SENSITIVITY)    EKG EKG Interpretation  Date/Time:  Tuesday February 28 2022 15:04:15 EST Ventricular Rate:  80 PR Interval:  196 QRS Duration: 82 QT Interval:  370 QTC Calculation: 426 R Axis:   -12 Text Interpretation: Normal sinus rhythm Anteroseptal infarct , age undetermined Abnormal ECG No previous ECGs available Confirmed by Regan Lemming ([id number]) on 02/28/2022 4:50:03 PM  Radiology DG Chest 2 View  Result Date: 02/28/2022 CLINICAL DATA:  Shortness of breath EXAM: CHEST - 2 VIEW COMPARISON:  Radiograph 04/01/2021 FINDINGS: Unchanged cardiomediastinal silhouette. There is no focal airspace  consolidation. There is no pleural effusion or evidence of pneumothorax. There is no acute osseous abnormality. Thoracic spondylosis. Cervical spine fusion hardware noted. IMPRESSION: No evidence of acute cardiopulmonary disease. Electronically Signed   By: Maurine Simmering M.D.   On: 02/28/2022 15:36    Procedures Procedures    Medications Ordered in ED Medications  ipratropium-albuterol (DUONEB) 0.5-2.5 (3) MG/3ML nebulizer solution 3 mL (3 mLs Nebulization Not Given 02/28/22 1729)  albuterol (PROVENTIL) (2.5 MG/3ML) 0.083% nebulizer solution (10 mg/hr Nebulization New Bag/Given 02/28/22 1736)  dexamethasone (DECADRON) injection 10 mg (has no administration in time range)  magnesium sulfate IVPB 2 g 50 mL (has no administration  in time range)  ipratropium-albuterol (DUONEB) 0.5-2.5 (3) MG/3ML nebulizer solution (6 mLs  Given 02/28/22 1622)  guaiFENesin (ROBITUSSIN) 100 MG/5ML liquid 15 mL (15 mLs Oral Given 02/28/22 1729)    ED Course/ Medical Decision Making/ A&P  Medical Decision Making Amount and/or Complexity of Data Reviewed Labs: ordered. Radiology: ordered.  Risk OTC drugs. Prescription drug management.   72 year old female presents to the ED for evaluation.  Please see HPI for further details.  On examination the patient is afebrile and nontachycardic.  Lung sounds are rhonchorous and wheezing throughout, she is not hypoxic.  Abdomen soft and compressible throughout.  Posterior oropharynx is not erythematous, no exudate.  Patient nontoxic in appearance.  Workup will include CBC, BMP, respiratory panel, troponin, EKG, chest x-ray.  CBC unremarkable, no leukocytosis or anemia.  BMP with elevated glucose however no other derangements.  Viral panel negative for all.  Troponin 2 and less than 2 respectively.  EKG nonischemic.  Chest x-ray shows no consolidations or effusions.  Patient initially treated with 1 DuoNeb.  Patient still wheezing on exam.  Continuous DuoNeb initiated at  this time along with administration of Robitussin.  Patient wheezing is decreased on reassessment however patient still very rhonchorous.  Patient will be given magnesium sulfate 2 g as well as 10 mg Decadron and additional duo nebulizer.  Patient will also be ambulated with pulse oximetry to assess for hypoxia.  At end of my shift, patient workup not complete.  Patient signed out to oncoming provider Libby Maw, PA-C for further management.    Final Clinical Impression(s) / ED Diagnoses Final diagnoses:  Shortness of breath    Rx / DC Orders ED Discharge Orders     None         Azucena Cecil, PA-C 02/28/22 1919    Lorelle Gibbs, DO 03/01/22 0000

## 2022-02-28 NOTE — ED Notes (Signed)
Patient ambulated with pulse ox down hallway and back. Lowest SpO2 documented was 89%. Patient tolerated well until almost back at her room, where she got short of breath and started coughing.

## 2022-02-28 NOTE — Discharge Instructions (Signed)
You were evaluated today for shortness of breath.  Your symptoms are consistent with an upper respiratory infection.  As discussed and at your request I have switched your antibiotics from Augmentin to Levaquin.  There are risks with Levaquin.  Levaquin can cause tendinitis, tendon rupture, peripheral neuropathy, and structural nervous system effects.  Please follow-up with your primary care provider for further evaluation and management.  If your shortness of breath worsens or you develop other life-threatening symptoms please return to the emergency department  I have prescribed Ciprodex for your ear drainage.  Please take the drops as directed.

## 2022-05-01 ENCOUNTER — Ambulatory Visit (INDEPENDENT_AMBULATORY_CARE_PROVIDER_SITE_OTHER): Payer: Medicare Other | Admitting: Orthopaedic Surgery

## 2022-05-01 ENCOUNTER — Other Ambulatory Visit: Payer: Self-pay

## 2022-05-01 ENCOUNTER — Other Ambulatory Visit (INDEPENDENT_AMBULATORY_CARE_PROVIDER_SITE_OTHER): Payer: Medicare Other

## 2022-05-01 DIAGNOSIS — Z96642 Presence of left artificial hip joint: Secondary | ICD-10-CM | POA: Diagnosis not present

## 2022-05-01 DIAGNOSIS — G8929 Other chronic pain: Secondary | ICD-10-CM

## 2022-05-01 NOTE — Progress Notes (Signed)
The patient is a 72 year old female well-known to me.  I have replaced both of her hips and one of her knees.  Her hips are replaced a very short amount of time between her hip replacements.  She has chronic lumbar spine issues.  She has had at least 3 different lumbar spine surgeries in the past at [facility] but those surgeons have since retired.  She does not have any instrumentation in her lumbar spine.  She has had some pain in her left hip and groin area and she went to make sure that that was fine from our standpoint.  Both her hips move smoothly and fluidly with no pain in the groin at all today or blocks to rotation.  Standing AP pelvis and lateral left hip shows that both of her hip replacements are in good position and I see no complicating features of the hip replacements.  I have and compared this with a lot of previous films that she has had.  I did see that there was a CT scan of her abdomen pelvis in 2022 for other issues.  I can see the hip replacements on those films and they appear normal.  However she does have severe degenerative changes throughout her lumbar spine.  I gave her reassurance that thus far I think her hip replacements look fine.  I would like to send her to one of my colleagues with [facility] since she has had surgery in the past and there on her lumbar spine multiple times to see what their opinion would be in terms of her low back and the low back pain that she is experiencing that is having some radicular components into the left hip.  She agrees with having that referral.

## 2022-05-16 DIAGNOSIS — Z6841 Body Mass Index (BMI) 40.0 and over, adult: Secondary | ICD-10-CM | POA: Diagnosis not present

## 2022-05-16 DIAGNOSIS — M5416 Radiculopathy, lumbar region: Secondary | ICD-10-CM | POA: Diagnosis not present

## 2022-06-01 DIAGNOSIS — M545 Low back pain, unspecified: Secondary | ICD-10-CM | POA: Diagnosis not present

## 2022-06-01 DIAGNOSIS — M5416 Radiculopathy, lumbar region: Secondary | ICD-10-CM | POA: Diagnosis not present

## 2022-06-06 DIAGNOSIS — M5416 Radiculopathy, lumbar region: Secondary | ICD-10-CM | POA: Diagnosis not present

## 2022-06-28 DIAGNOSIS — Z6841 Body Mass Index (BMI) 40.0 and over, adult: Secondary | ICD-10-CM | POA: Diagnosis not present

## 2022-06-28 DIAGNOSIS — Z0000 Encounter for general adult medical examination without abnormal findings: Secondary | ICD-10-CM | POA: Diagnosis not present

## 2022-06-28 DIAGNOSIS — J40 Bronchitis, not specified as acute or chronic: Secondary | ICD-10-CM | POA: Diagnosis not present

## 2022-06-28 DIAGNOSIS — J4540 Moderate persistent asthma, uncomplicated: Secondary | ICD-10-CM | POA: Diagnosis not present

## 2022-06-28 DIAGNOSIS — I700 Atherosclerosis of aorta: Secondary | ICD-10-CM | POA: Diagnosis not present

## 2022-06-28 DIAGNOSIS — I10 Essential (primary) hypertension: Secondary | ICD-10-CM | POA: Diagnosis not present

## 2022-06-28 DIAGNOSIS — E785 Hyperlipidemia, unspecified: Secondary | ICD-10-CM | POA: Diagnosis not present

## 2022-06-28 DIAGNOSIS — H61893 Other specified disorders of external ear, bilateral: Secondary | ICD-10-CM | POA: Diagnosis not present

## 2022-07-10 ENCOUNTER — Ambulatory Visit
Admission: RE | Admit: 2022-07-10 | Discharge: 2022-07-10 | Disposition: A | Discharge status: Home / Self Care | Payer: Medicare Other | Source: Ambulatory Visit | Attending: Family Medicine | Admitting: Family Medicine

## 2022-07-10 DIAGNOSIS — Z1231 Encounter for screening mammogram for malignant neoplasm of breast: Secondary | ICD-10-CM | POA: Diagnosis not present

## 2022-07-11 DIAGNOSIS — H2513 Age-related nuclear cataract, bilateral: Secondary | ICD-10-CM | POA: Diagnosis not present

## 2022-07-31 ENCOUNTER — Encounter: Payer: Self-pay | Admitting: Physician Assistant

## 2022-07-31 ENCOUNTER — Other Ambulatory Visit: Payer: Self-pay

## 2022-07-31 ENCOUNTER — Ambulatory Visit (INDEPENDENT_AMBULATORY_CARE_PROVIDER_SITE_OTHER): Payer: Medicare Other | Admitting: Sports Medicine

## 2022-07-31 ENCOUNTER — Ambulatory Visit (INDEPENDENT_AMBULATORY_CARE_PROVIDER_SITE_OTHER): Payer: Medicare Other

## 2022-07-31 ENCOUNTER — Ambulatory Visit (INDEPENDENT_AMBULATORY_CARE_PROVIDER_SITE_OTHER): Payer: Medicare Other | Admitting: Physician Assistant

## 2022-07-31 DIAGNOSIS — M19011 Primary osteoarthritis, right shoulder: Secondary | ICD-10-CM

## 2022-07-31 DIAGNOSIS — M25531 Pain in right wrist: Secondary | ICD-10-CM

## 2022-07-31 DIAGNOSIS — M25511 Pain in right shoulder: Secondary | ICD-10-CM

## 2022-07-31 MED ORDER — LIDOCAINE HCL 1 % IJ SOLN
2.0000 mL | INTRAMUSCULAR | Status: AC | PRN
  Administered 2022-07-31: 2 mL

## 2022-07-31 MED ORDER — METHYLPREDNISOLONE ACETATE 40 MG/ML IJ SUSP
20.0000 mg | INTRAMUSCULAR | Status: AC | PRN
  Administered 2022-07-31: 20 mg

## 2022-07-31 MED ORDER — METHYLPREDNISOLONE ACETATE 40 MG/ML IJ SUSP
80.0000 mg | INTRAMUSCULAR | Status: AC | PRN
  Administered 2022-07-31: 80 mg via INTRA_ARTICULAR

## 2022-07-31 MED ORDER — BUPIVACAINE HCL 0.25 % IJ SOLN
2.0000 mL | INTRAMUSCULAR | Status: AC | PRN
  Administered 2022-07-31: 2 mL via INTRA_ARTICULAR

## 2022-07-31 MED ORDER — LIDOCAINE HCL 1 % IJ SOLN
0.5000 mL | INTRAMUSCULAR | Status: AC | PRN
  Administered 2022-07-31: .5 mL

## 2022-07-31 NOTE — Progress Notes (Signed)
   Procedure Note  Patient: [name]             Date of Birth: 12-10-1950           MRN: [id number]             Visit Date: 07/31/2022  Procedures: Visit Diagnoses:  1. Acute pain of right shoulder   2. Primary osteoarthritis, right shoulder    Large Joint Inj: L glenohumeral on 07/31/2022 9:49 AM Indications: pain Details: 22 G 3.5 in needle, ultrasound-guided posterior approach Medications: 2 mL lidocaine 1 %; 2 mL bupivacaine 0.25 %; 80 mg methylPREDNISolone acetate 40 MG/ML Outcome: tolerated well, no immediate complications Procedure, treatment alternatives, risks and benefits explained, specific risks discussed. Consent was given by the patient. Immediately prior to procedure a time out was called to verify the correct patient, procedure, equipment, support staff and site/side marked as required. Patient was prepped and draped in the usual sterile fashion.     - I evaluated the patient about 5 minutes post-injection and they had improvement in pain and range of motion - follow-up with Rexene Edison, PAC as indicated; I am happy to see them as needed  Madelyn Brunner, DO Primary Care Sports Medicine Physician  [facility]oped[facility]  This note was dictated using Dragon naturally speaking software and may contain errors in syntax, spelling, or content which have not been identified prior to signing this note.

## 2022-07-31 NOTE — Progress Notes (Signed)
Office Visit Note   Patient: [name]           Date of Birth: [birth year]           MRN: [id number] Visit Date: 07/31/2022              Requested by: Camie Patience, FNP [address] [place],  [place] [address] PCP: Camie Patience, FNP   Assessment & Plan: Visit Diagnoses:  1. Acute pain of right shoulder   2. Pain in right wrist     Plan:  Will send her for an intra-articular injection of the right shoulder under ultrasound with Dr. Shon Baton.  Discussed with her that I felt that given her arthritic changes of the shoulder this would give her more relief than a subacromial injection.  She is agreeable.  Regards to the right wrist that she given the lateral distal ulnar injection.  She tolerated this well.  Had relief immediately after the injection with range of motion of the wrist.  She will follow-up with [place] as needed.  Questions encouraged and answered at length.  Follow-Up Instructions: Return if symptoms worsen or fail to improve.   Orders:  Orders Placed This Encounter  Procedures   Hand/UE Inj   XR Shoulder Right   XR Hand Complete Right   No orders of the defined types were placed in this encounter.     Procedures: Hand/UE Inj on 07/31/2022 9:02 AM Indications: pain Details: lateral approach Medications: 0.5 mL lidocaine 1 %; 20 mg methylPREDNISolone acetate 40 MG/ML Consent was given by the patient. Immediately prior to procedure a time out was called to verify the correct patient, procedure, equipment, support staff and site/side marked as required. Patient was prepped and draped in the usual sterile fashion.       Clinical Data: No additional findings.   Subjective: Chief Complaint  Patient presents with   Right Shoulder - Pain   Right Hand - Pain    HPI Mr. Robling comes in today for right shoulder pain and right wrist pain.  She ranks her shoulder pain to be 6 out of 10 at worst in her wrist pain to be 7 out of 10.  She  tried Voltaren cream with some old diclofenac oral medication.  These gave her no relief.  She has had no particular injury.  However she has been lifting a lot of boxes and feels that this may have aggravated her shoulder and her wrist.  Denies any fevers chills.  Review of Systems See HPI otherwise negative  Objective: Vital Signs: LMP 11/15/2015 Comment: spotting 11-15-15  Physical Exam Constitutional:      Appearance: She is not ill-appearing or diaphoretic.  Cardiovascular:     Pulses: Normal pulses.  Neurological:     Mental Status: She is alert and oriented to person, place, and time.  Psychiatric:        Mood and Affect: Mood normal.     Ortho Exam Bilateral shoulders: Passive range of motion bilateral shoulders reveals crepitus.  Positive impingement right.  Cuff strength is 5 out of 5 throughout bilateral shoulders.  Forward flexion actively to approximately 170 degrees bilaterally with discomfort. Bilateral hands: No rashes skin lesions ulcerations.  Tenderness distal right ulnar styloid region.  Radial pulses 2+ bilaterally equal symmetric.  Full range of motion left wrist without pain.  Ulnar and radial deviation right wrist causes discomfort distal ulna region.  Specialty Comments:  No specialty comments available.  Imaging: XR Hand Complete Right  Result Date: 07/31/2022 Right hand 3 views: No acute fracture.  Index DIP joint narrowing with radial deviation.  Cystic changes distal radius and ulna.  Ossifications distal to the ulnar styloid.  Changes consistent with arthritic changes of the wrist.  XR Shoulder Right  Result Date: 07/31/2022 Right shoulder 3 views: Shoulder is well located.  Moderate AC joint changes.  No acute fracture.  Complete loss of glenohumeral joint space.  Calcific changes of the rotator cuff.    PMFS History: Patient Active Problem List   Diagnosis Date Noted   PND (post-nasal drip) 03/18/2021   Asthma attack 03/17/2021   Dyslipidemia  03/14/2021   Asthma exacerbation 03/14/2021   Acute asthma exacerbation 03/13/2021   Osteoarthritis of left knee 08/10/2015   Status post total left knee replacement 08/10/2015   Osteoarthritis of left hip 11/02/2014   Status post total replacement of left hip 11/02/2014   Osteoarthritis of right hip 09/08/2014   Status post total replacement of right hip 09/08/2014   PMB (postmenopausal bleeding) 02/28/2013   Morbid obesity (HCC) 02/28/2013   Pure hypercholesterolemia 02/28/2013   Essential hypertension, benign 02/28/2013   Past Medical History:  Diagnosis Date   Abnormal Pap smear of cervix 03/2009   Colpo Biopsy CIN I with HPV   Arthritis    Asthma    Bilateral edema of lower extremity    Takes lasix if needed   Bronchitis    Disc disorder    bulging disc in thoracic area   GERD (gastroesophageal reflux disease)    occ   Hypercholesteremia    Hypertension since 1990's   Lumbar herniated disc    Obesity    Pneumonia    hx   Scoliosis    Spinal stenosis    Teeth grinding     Family History  Problem Relation Age of Onset   Breast cancer Sister 57       bilateral mastectomy- Breast cancer    Hypertension Mother    Heart disease Mother    Stroke Mother    Heart disease Father    Depression Father    Alcohol abuse Father    Stroke Maternal Grandmother    Emphysema Paternal Grandmother    Breast cancer Maternal Aunt        Both aunts in 78 -90's   Heart failure Paternal Grandfather    Alcohol abuse Paternal Grandfather    Depression Daughter     Past Surgical History:  Procedure Laterality Date   APPENDECTOMY  1965   BACK SURGERY  1993   Thoracic and Lumbar   BREAST BIOPSY Right 1990   benign cyst   BREAST EXCISIONAL BIOPSY Right 1990   benign   CERVICAL DISC SURGERY  2007   COLONOSCOPY W/ POLYPECTOMY     HERNIA REPAIR  1977, as child   bilateral inguinal -1 during pregnancy.  Umbilical repair as child   KNEE ARTHROSCOPY Right 1990   LUMBAR DISC SURGERY   2006   SHOULDER ARTHROSCOPY Left 1994   arthritis, hips,fingers   TONSILLECTOMY AND ADENOIDECTOMY     TOTAL HIP ARTHROPLASTY Right 09/08/2014   Procedure: RIGHT TOTAL HIP ARTHROPLASTY ANTERIOR APPROACH;  Surgeon: Kathryne Hitch, MD;  Location: [facility] OR;  Service: Orthopedics;  Laterality: Right;   TOTAL HIP ARTHROPLASTY Left 11/02/2014   Procedure: LEFT TOTAL HIP ARTHROPLASTY ANTERIOR APPROACH;  Surgeon: Kathryne Hitch, MD;  Location: [facility] OR;  Service: Orthopedics;  Laterality: Left;   TOTAL  KNEE ARTHROPLASTY Right 2009   TOTAL KNEE ARTHROPLASTY Left 08/10/2015   Procedure: LEFT TOTAL KNEE ARTHROPLASTY;  Surgeon: Kathryne Hitch, MD;  Location: [facility] OR;  Service: Orthopedics;  Laterality: Left;   Social History   Occupational History   Not on file  Tobacco Use   Smoking status: Never   Smokeless tobacco: Never  Vaping Use   Vaping status: Never Used  Substance and Sexual Activity   Alcohol use: Not Currently    Comment: occasionally    Drug use: No   Sexual activity: Not Currently    Partners: Male    Birth control/protection: Post-menopausal

## 2022-09-06 DIAGNOSIS — Z85828 Personal history of other malignant neoplasm of skin: Secondary | ICD-10-CM | POA: Diagnosis not present

## 2022-09-06 DIAGNOSIS — L720 Epidermal cyst: Secondary | ICD-10-CM | POA: Diagnosis not present

## 2022-10-31 DIAGNOSIS — Z23 Encounter for immunization: Secondary | ICD-10-CM | POA: Diagnosis not present

## 2023-01-01 DIAGNOSIS — M26629 Arthralgia of temporomandibular joint, unspecified side: Secondary | ICD-10-CM | POA: Diagnosis not present

## 2023-01-02 DIAGNOSIS — H6690 Otitis media, unspecified, unspecified ear: Secondary | ICD-10-CM | POA: Diagnosis not present

## 2023-01-02 DIAGNOSIS — L309 Dermatitis, unspecified: Secondary | ICD-10-CM | POA: Diagnosis not present

## 2023-02-05 ENCOUNTER — Other Ambulatory Visit: Payer: Self-pay | Admitting: Family Medicine

## 2023-02-05 DIAGNOSIS — Z1231 Encounter for screening mammogram for malignant neoplasm of breast: Secondary | ICD-10-CM

## 2023-02-26 ENCOUNTER — Other Ambulatory Visit: Payer: Self-pay | Admitting: Orthopaedic Surgery

## 2023-02-26 ENCOUNTER — Encounter: Payer: Self-pay | Admitting: Orthopaedic Surgery

## 2023-02-26 ENCOUNTER — Telehealth: Payer: Self-pay | Admitting: Orthopaedic Surgery

## 2023-02-26 MED ORDER — DICLOFENAC SODIUM 75 MG PO TBEC
75.0000 mg | DELAYED_RELEASE_TABLET | Freq: Two times a day (BID) | ORAL | 1 refills | Status: DC | PRN
Start: 2023-02-26 — End: 2023-04-26

## 2023-02-26 MED ORDER — DICLOFENAC SODIUM 75 MG PO TBEC
75.0000 mg | DELAYED_RELEASE_TABLET | Freq: Two times a day (BID) | ORAL | 1 refills | Status: DC | PRN
Start: 2023-02-26 — End: 2023-02-26

## 2023-02-26 MED ORDER — TIZANIDINE HCL 2 MG PO TABS: 2.0000 mg | ORAL_TABLET | Freq: Three times a day (TID) | ORAL | 0 refills | Status: DC | PRN

## 2023-02-26 NOTE — Telephone Encounter (Signed)
 Patient's daughter Jennette Kettle called advised patient need Rx refilled Diclofenac and Flexeril.Deanna said patient is out of her medication. Deanna said patient uses [facility] on [address]. The number to contact Deanna is [phone]

## 2023-02-27 ENCOUNTER — Other Ambulatory Visit: Payer: Self-pay

## 2023-02-27 ENCOUNTER — Encounter: Payer: Self-pay | Admitting: Sports Medicine

## 2023-02-27 ENCOUNTER — Ambulatory Visit (INDEPENDENT_AMBULATORY_CARE_PROVIDER_SITE_OTHER): Payer: Medicare Other | Admitting: Sports Medicine

## 2023-02-27 DIAGNOSIS — M19011 Primary osteoarthritis, right shoulder: Secondary | ICD-10-CM

## 2023-02-27 DIAGNOSIS — G8929 Other chronic pain: Secondary | ICD-10-CM

## 2023-02-27 DIAGNOSIS — M7531 Calcific tendinitis of right shoulder: Secondary | ICD-10-CM | POA: Diagnosis not present

## 2023-02-27 DIAGNOSIS — U071 COVID-19: Secondary | ICD-10-CM | POA: Diagnosis not present

## 2023-02-27 DIAGNOSIS — M25511 Pain in right shoulder: Secondary | ICD-10-CM

## 2023-02-27 DIAGNOSIS — J069 Acute upper respiratory infection, unspecified: Secondary | ICD-10-CM | POA: Diagnosis not present

## 2023-02-27 NOTE — Progress Notes (Unsigned)
 [name] - 73 y.o. female MRN [id number]  Date of birth: [birth year]  Office Visit Note: Visit Date: 02/27/2023 PCP: Camie Patience, FNP Referred by: Camie Patience, FNP  Subjective: Chief Complaint  Patient presents with   Right Shoulder - Pain   HPI: [name] is a pleasant 73 y.o. female who presents today for acute on chronic right shoulder pain.  Continues with right shoulder pain as well as losing some range of motion. We did perform an ultrasound-guided glenohumeral joint injection back on 07/31/2022 which gave her good relief for a period of time, but here recently over the past few months her pain has been exacerbated.  She is still to remain active with shoulder.  She does use oral diclofenac 75 mg once to twice daily as she does not take consistently but only for breakthrough pain.  She does have hip and knee replacements, she and her daughter, [name], are asking about shoulder replacement and what this would entail as well as success rate.  Pertinent ROS were reviewed with the patient and found to be negative unless otherwise specified above in HPI.   Assessment & Plan: Visit Diagnoses:  1. Primary osteoarthritis, right shoulder   2. Chronic right shoulder pain   3. Calcific tendinitis of right shoulder region    Plan: [name] is dealing with an acute exacerbation of her chronic right shoulder pain which does have advancing osteoarthritis as well as a degree of rotator cuff arthropathy.  She did get good relief from prior injection into the shoulder, but I believe we need to pare this with some therapy dedicated for the shoulder.  There is Xu decision making, we did proceed with ultrasound-guided right shoulder glenohumeral joint injection, patient tolerated well.  Discussed postinjection protocol.  After 48 hours, she may begin her exercises.  She prefers home therapy versus PT.  We did print out a customized handout for both shoulder mobility and rotator cuff  strengthening exercises, my athletic trainer Isabelle Course did review these in the room with her today.  She will perform these once daily.  For pain control she may continue her oral diclofenac 75 mg once to twice daily only as needed for breakthrough pain, recommend taking this with food.  We did discuss the nature of infrequent injections as needed.  We did discuss surgical management for this, I do not feel she is quite ready for this, but we would need to obtain an MRI to evaluate the rotator cuff prior to having surgical intevention.  She will follow-up with me in 3 months as needed.  Follow-up: Return in about 3 months (around 05/27/2023), or if symptoms worsen or fail to improve, for R-shoulder OA.   Meds & Orders: No orders of the defined types were placed in this encounter.   Orders Placed This Encounter  Procedures   Large Joint Inj   US Guided Needle Placement - No Linked Charges     Procedures: Large Joint Inj: R glenohumeral on 02/27/2023 10:41 AM Indications: pain Details: 22 G 3.5 in needle, ultrasound-guided posterior approach Medications: 2 mL lidocaine 1 %; 2 mL bupivacaine 0.25 %; 80 mg methylPREDNISolone acetate 40 MG/ML Outcome: tolerated well, no immediate complications  US-guided glenohumeral joint injection, right shoulder After discussion on risks/benefits/indications, informed verbal consent was obtained. A timeout was then performed. The patient was positioned lying lateral recumbent on examination table. The patient's shoulder was prepped with betadine and multiple alcohol swabs and utilizing ultrasound guidance, the patient's  glenohumeral joint was identified on ultrasound. Using ultrasound guidance a 22-gauge, 3.5 inch needle with a mixture of 2:2:2 cc's lidocaine:bupivicaine:depomedrol was directed from a lateral to medial direction via in-plane technique into the glenohumeral joint with visualization of appropriate spread of injectate into the joint. Patient tolerated the  procedure well without immediate complications.      Procedure, treatment alternatives, risks and benefits explained, specific risks discussed. Consent was given by the patient. Immediately prior to procedure a time out was called to verify the correct patient, procedure, equipment, support staff and site/side marked as required. Patient was prepped and draped in the usual sterile fashion.         Clinical History: No specialty comments available.  She reports that she has never smoked. She has never used smokeless tobacco. No results for input(s): "HGBA1C", "LABURIC" in the last 8760 hours.  Objective:   Vital Signs: LMP 11/15/2015 Comment: spotting 11-15-15  Physical Exam  Gen: Well-appearing, in no acute distress; non-toxic CV: Well-perfused. Warm.  Resp: Breathing unlabored on room air; no wheezing.  Ortho Exam - Right shoulder: There is no redness swelling or effusion.  There is about 8 degrees less of forward flexion compared to the contralateral shoulder, internal rotation is significantly limited.  There is crepitus noted with range of motion.  There is some pain with empty can and resisted abduction although no gross weakness compared to the contralateral shoulder.   Imaging:  - R shoulder XR 07/31/22: Right shoulder 3 views: Shoulder is well located.  Moderate AC joint  changes.  No acute fracture.  Complete loss of glenohumeral joint space.   Calcific changes of the rotator cuff.   *Independent review and interpretation of right shoulder x-ray (3v) from 07/31/22 was performed by myself today.  3 views were notified, there is advanced glenohumeral joint space narrowing with osteoarthritis.  There is some sclerosis of the inferior glenoid.  There is calcification within the superior glenohumeral joint indicative of possible calcific change versus chondrocalcinosis.  Moderate AC joint arthropathy.  No acute fracture noted.  Past Medical/Family/Surgical/Social  History: Medications & Allergies reviewed per EMR, new medications updated. Patient Active Problem List   Diagnosis Date Noted   PND (post-nasal drip) 03/18/2021   Asthma attack 03/17/2021   Dyslipidemia 03/14/2021   Asthma exacerbation 03/14/2021   Acute asthma exacerbation 03/13/2021   Osteoarthritis of left knee 08/10/2015   Status post total left knee replacement 08/10/2015   Osteoarthritis of left hip 11/02/2014   Status post total replacement of left hip 11/02/2014   Osteoarthritis of right hip 09/08/2014   Status post total replacement of right hip 09/08/2014   PMB (postmenopausal bleeding) 02/28/2013   Morbid obesity (HCC) 02/28/2013   Pure hypercholesterolemia 02/28/2013   Essential hypertension, benign 02/28/2013   Past Medical History:  Diagnosis Date   Abnormal Pap smear of cervix 03/2009   Colpo Biopsy CIN I with HPV   Arthritis    Asthma    Bilateral edema of lower extremity    Takes lasix if needed   Bronchitis    Disc disorder    bulging disc in thoracic area   GERD (gastroesophageal reflux disease)    occ   Hypercholesteremia    Hypertension since 1990's   Lumbar herniated disc    Obesity    Pneumonia    hx   Scoliosis    Spinal stenosis    Teeth grinding    Family History  Problem Relation Age of Onset   Breast  cancer Sister 63       bilateral mastectomy- Breast cancer    Hypertension Mother    Heart disease Mother    Stroke Mother    Heart disease Father    Depression Father    Alcohol abuse Father    Stroke Maternal Grandmother    Emphysema Paternal Grandmother    Breast cancer Maternal Aunt        Both aunts in 60 -79's   Heart failure Paternal Grandfather    Alcohol abuse Paternal Grandfather    Depression Daughter    Past Surgical History:  Procedure Laterality Date   APPENDECTOMY  1965   BACK SURGERY  1993   Thoracic and Lumbar   BREAST BIOPSY Right 1990   benign cyst   BREAST EXCISIONAL BIOPSY Right 1990   benign   CERVICAL  DISC SURGERY  2007   COLONOSCOPY W/ POLYPECTOMY     HERNIA REPAIR  1977, as child   bilateral inguinal -1 during pregnancy.  Umbilical repair as child   KNEE ARTHROSCOPY Right 1990   LUMBAR DISC SURGERY  2006   SHOULDER ARTHROSCOPY Left 1994   arthritis, hips,fingers   TONSILLECTOMY AND ADENOIDECTOMY     TOTAL HIP ARTHROPLASTY Right 09/08/2014   Procedure: RIGHT TOTAL HIP ARTHROPLASTY ANTERIOR APPROACH;  Surgeon: Kathryne Hitch, MD;  Location: [facility] OR;  Service: Orthopedics;  Laterality: Right;   TOTAL HIP ARTHROPLASTY Left 11/02/2014   Procedure: LEFT TOTAL HIP ARTHROPLASTY ANTERIOR APPROACH;  Surgeon: Kathryne Hitch, MD;  Location: [facility] OR;  Service: Orthopedics;  Laterality: Left;   TOTAL KNEE ARTHROPLASTY Right 2009   TOTAL KNEE ARTHROPLASTY Left 08/10/2015   Procedure: LEFT TOTAL KNEE ARTHROPLASTY;  Surgeon: Kathryne Hitch, MD;  Location: [facility] OR;  Service: Orthopedics;  Laterality: Left;   Social History   Occupational History   Not on file  Tobacco Use   Smoking status: Never   Smokeless tobacco: Never  Vaping Use   Vaping status: Never Used  Substance and Sexual Activity   Alcohol use: Not Currently    Comment: occasionally    Drug use: No   Sexual activity: Not Currently    Partners: Male    Birth control/protection: Post-menopausal

## 2023-02-27 NOTE — Progress Notes (Unsigned)
 Patient says that she got relief from her last injection but has been pushing this one off. She says that gradually overtime she has lost ROM and ability to do her daily activities. She does have some questions regarding shoulder replacement, and would like to try to continue with injections while she is still getting relief as she is still very active.  Patient was instructed in 10 minutes of therapeutic exercises for right shoulder to improve strength, ROM and function according to my instructions and plan of care by a Certified Athletic Trainer during the office visit. A customized handout was provided and demonstration of proper technique shown and discussed. Patient did perform exercises and demonstrate understanding through teachback.  All questions discussed and answered.

## 2023-02-28 MED ORDER — BUPIVACAINE HCL 0.25 % IJ SOLN
2.0000 mL | INTRAMUSCULAR | Status: AC | PRN
  Administered 2023-02-27: 2 mL via INTRA_ARTICULAR

## 2023-02-28 MED ORDER — METHYLPREDNISOLONE ACETATE 40 MG/ML IJ SUSP
80.0000 mg | INTRAMUSCULAR | Status: AC | PRN
  Administered 2023-02-27: 80 mg via INTRA_ARTICULAR

## 2023-02-28 MED ORDER — LIDOCAINE HCL 1 % IJ SOLN
2.0000 mL | INTRAMUSCULAR | Status: AC | PRN
  Administered 2023-02-27: 2 mL

## 2023-03-03 DIAGNOSIS — U071 COVID-19: Secondary | ICD-10-CM | POA: Diagnosis not present

## 2023-03-03 DIAGNOSIS — M940 Chondrocostal junction syndrome [Tietze]: Secondary | ICD-10-CM | POA: Diagnosis not present

## 2023-03-06 ENCOUNTER — Emergency Department (HOSPITAL_BASED_OUTPATIENT_CLINIC_OR_DEPARTMENT_OTHER)

## 2023-03-06 ENCOUNTER — Emergency Department (HOSPITAL_BASED_OUTPATIENT_CLINIC_OR_DEPARTMENT_OTHER)
Admission: EM | Admit: 2023-03-06 | Discharge: 2023-03-07 | Disposition: A | Discharge status: Home / Self Care | Attending: Emergency Medicine | Admitting: Emergency Medicine

## 2023-03-06 ENCOUNTER — Emergency Department (HOSPITAL_BASED_OUTPATIENT_CLINIC_OR_DEPARTMENT_OTHER): Admitting: Radiology

## 2023-03-06 ENCOUNTER — Encounter (HOSPITAL_BASED_OUTPATIENT_CLINIC_OR_DEPARTMENT_OTHER): Payer: Self-pay | Admitting: Emergency Medicine

## 2023-03-06 ENCOUNTER — Other Ambulatory Visit: Payer: Self-pay

## 2023-03-06 DIAGNOSIS — Z7951 Long term (current) use of inhaled steroids: Secondary | ICD-10-CM | POA: Diagnosis not present

## 2023-03-06 DIAGNOSIS — Z7982 Long term (current) use of aspirin: Secondary | ICD-10-CM | POA: Diagnosis not present

## 2023-03-06 DIAGNOSIS — J45909 Unspecified asthma, uncomplicated: Secondary | ICD-10-CM | POA: Diagnosis not present

## 2023-03-06 DIAGNOSIS — R0602 Shortness of breath: Secondary | ICD-10-CM | POA: Diagnosis present

## 2023-03-06 DIAGNOSIS — I700 Atherosclerosis of aorta: Secondary | ICD-10-CM | POA: Diagnosis not present

## 2023-03-06 DIAGNOSIS — R079 Chest pain, unspecified: Secondary | ICD-10-CM | POA: Diagnosis not present

## 2023-03-06 DIAGNOSIS — I10 Essential (primary) hypertension: Secondary | ICD-10-CM | POA: Diagnosis not present

## 2023-03-06 DIAGNOSIS — M549 Dorsalgia, unspecified: Secondary | ICD-10-CM | POA: Diagnosis not present

## 2023-03-06 DIAGNOSIS — U071 COVID-19: Secondary | ICD-10-CM | POA: Diagnosis not present

## 2023-03-06 DIAGNOSIS — D71 Functional disorders of polymorphonuclear neutrophils: Secondary | ICD-10-CM | POA: Diagnosis not present

## 2023-03-06 LAB — BASIC METABOLIC PANEL
Anion gap: 9 (ref 5–15)
BUN: 17 mg/dL (ref 8–23)
CO2: 28 mmol/L (ref 22–32)
Calcium: 9 mg/dL (ref 8.9–10.3)
Chloride: 104 mmol/L (ref 98–111)
Creatinine, Ser: 0.61 mg/dL (ref 0.44–1.00)
GFR, Estimated: >60 mL/min (ref >60)
Glucose, Bld: 116 mg/dL — ABNORMAL HIGH (ref 70–99)
Potassium: 4.6 mmol/L (ref 3.5–5.1)
Sodium: 141 mmol/L (ref 135–145)

## 2023-03-06 LAB — D-DIMER, QUANTITATIVE: D-Dimer, Quant: 0.89 ug{FEU}/mL — ABNORMAL HIGH (ref 0.00–0.50)

## 2023-03-06 LAB — CBC
HCT: 45.5 % (ref 36.0–46.0)
Hemoglobin: 14.6 g/dL (ref 12.0–15.0)
MCH: 28.6 pg (ref 26.0–34.0)
MCHC: 32.1 g/dL (ref 30.0–36.0)
MCV: 89.2 fL (ref 80.0–100.0)
Platelets: 282 10*3/uL (ref 150–400)
RBC: 5.1 MIL/uL (ref 3.87–5.11)
RDW: 13.2 % (ref 11.5–15.5)
WBC: 9.9 10*3/uL (ref 4.0–10.5)
nRBC: 0 % (ref 0.0–0.2)

## 2023-03-06 LAB — TROPONIN I (HIGH SENSITIVITY)
Troponin I (High Sensitivity): 3 ng/L (ref <18)
Troponin I (High Sensitivity): 6 ng/L (ref <18)

## 2023-03-06 MED ORDER — IOHEXOL 350 MG/ML SOLN
100.0000 mL | Freq: Once | INTRAVENOUS | Status: AC | PRN
  Administered 2023-03-06: 75 mL via INTRAVENOUS

## 2023-03-06 MED ORDER — SODIUM CHLORIDE 0.9 % IV BOLUS
1000.0000 mL | Freq: Once | INTRAVENOUS | Status: AC
  Administered 2023-03-06: 1000 mL via INTRAVENOUS

## 2023-03-06 NOTE — ED Provider Notes (Signed)
 Received patient in turnover from Dr. Karene Fry.  Please see their note for further details of Hx, PE.  Briefly patient is a 73 y.o. female with a Shortness of Breath .  Patient has COVID.  Awaiting CT imaging.Marland Kitchen

## 2023-03-06 NOTE — ED Triage Notes (Signed)
 Covid + x 1 week Finished paxlovid  Sob, brain fog, low BP 99/48 at home yesterday (held her BP meds) Today noted high BP  Feeling "faint' and dizziness  Left sided chest pain

## 2023-03-06 NOTE — ED Provider Notes (Signed)
 [place] EMERGENCY DEPARTMENT AT [facility] Provider Note   CSN: [id number] Arrival date & time: 03/06/23  1803     History  Chief Complaint  Patient presents with   Shortness of Breath    [name] is a 73 y.o. female.   Shortness of Breath Associated symptoms: cough      73 year old female with medical history significant for morbid obesity, HLD, HTN, osteoarthritis, asthma, presenting to the emergency department with persistent symptoms of COVID-19.  The patient states that she was diagnosed with COVID 1 week ago.  She states that this is the first time she has been diagnosed with COVID-19.  She endorses persistent shortness of breath, describes slightly pleuritic left-sided chest discomfort that she had been previously told was musculoskeletal.  She endorses persistent cough and mild dyspnea.  Endorses lightheadedness and fatigue.  She had difficulty with low blood pressure but states that she has been pushing oral fluids over the past few days.  She stopped her home blood pressure medications.  She denies any active vomiting or diarrhea.  She denies any abdominal pain.  No strokelike symptoms, no room spinning dizziness.  Endorses lightheadedness and near syncope.  Home Medications Prior to Admission medications   Medication Sig Start Date End Date Taking? Authorizing Provider  albuterol (VENTOLIN HFA) 108 (90 Base) MCG/ACT inhaler Inhale 2 puffs into the lungs every 6 (six) hours as needed for wheezing or shortness of breath.    [provider]  Alpha Lipoic Acid 200 MG CAPS Take 200 mg by mouth daily.    [provider]  aspirin 81 MG EC tablet Take 81 mg by mouth See admin instructions. **pt takes twice a week** Monday and Thursday    [provider]  azelastine (ASTELIN) 0.1 % nasal spray Place 2 sprays into both nostrils 2 (two) times daily. Use in each nostril as directed 03/18/21   Calvert Cantor, MD  chlorpheniramine-HYDROcodone  10-8 MG/5ML Take 5 mLs by mouth at bedtime. 03/09/21   [provider]  cholecalciferol (VITAMIN D) 1000 UNITS tablet Take 1,000 Units by mouth 2 (two) times daily.    [provider]  Cinnamon 500 MG capsule Take 500 mg by mouth 2 (two) times daily.    [provider]  ciprofloxacin-dexamethasone (CIPRODEX) OTIC suspension Place 4 drops into both ears 2 (two) times daily. 02/28/22   Darrick Grinder, PA-C  Coenzyme Q10 (CO Q-10) 200 MG CAPS Take 200 mg by mouth every evening.    [provider]  diclofenac (VOLTAREN) 75 MG EC tablet Take 1 tablet (75 mg total) by mouth 2 (two) times daily as needed for mild pain (pain score 1-3). 02/26/23   Kathryne Hitch, MD  docusate sodium (COLACE) 100 MG capsule Take 200 mg by mouth at bedtime.    [provider]  felodipine (PLENDIL) 10 MG 24 hr tablet Take 10 mg by mouth daily.    [provider]  FLAXSEED, LINSEED, PO Take 1,300 mg by mouth daily.     [provider]  Garlic 1000 MG CAPS Take 1,000 mg by mouth daily.    [provider]  Grape Seed 50 MG TABS Take 50 mg by mouth daily.    [provider]  Guaifenesin 1200 MG TB12 Take 1,200 mg by mouth 2 (two) times daily.    [provider]  ipratropium-albuterol (DUONEB) 0.5-2.5 (3) MG/3ML SOLN Take 3 mLs by nebulization every 6 (six) hours as needed (shortness of  breath). 03/18/21 04/17/21  Calvert Cantor, MD  irbesartan (AVAPRO) 300 MG tablet Take 300 mg by mouth daily.    [provider]  labetalol (NORMODYNE) 200 MG tablet Take 400 mg by mouth 2 (two) times daily.     [provider]  lactobacillus acidophilus (BACID) TABS tablet Take 1 tablet by mouth every morning.    [provider]  Lecithin 1200 MG CAPS Take 1,200 mg by mouth daily.    [provider]  levofloxacin (LEVAQUIN) 500 MG tablet Take 1 tablet (500 mg total) by mouth daily. 02/28/22   Darrick Grinder, PA-C   Lutein 20 MG TABS Take 20 mg by mouth daily.    [provider]  meclizine (ANTIVERT) 25 MG tablet     [provider]  Multiple Vitamin (MULTIVITAMIN WITH MINERALS) TABS tablet Take 0.5 tablets by mouth 2 (two) times daily.     [provider]  Omega 3 1200 MG CAPS Take 1,200 mg by mouth 2 (two) times daily.    [provider]  simvastatin (ZOCOR) 20 MG tablet Take 20 mg by mouth at bedtime.     [provider]  sodium chloride (OCEAN) 0.65 % SOLN nasal spray Place 1 spray into both nostrils as needed for congestion.    [provider]  tiZANidine (ZANAFLEX) 2 MG tablet Take 1 tablet (2 mg total) by mouth every 8 (eight) hours as needed for muscle spasms. 02/26/23   Kathryne Hitch, MD  vitamin C (ASCORBIC ACID) 500 MG tablet Take 500 mg by mouth daily.    [provider]      Allergies    Etodolac and Adhesive [tape]    Review of Systems   Review of Systems  Respiratory:  Positive for cough and shortness of breath.   Neurological:  Positive for light-headedness.  All other systems reviewed and are negative.   Physical Exam Updated Vital Signs BP 139/64   Pulse 69   Temp 98.5 F (36.9 C) (Oral)   Resp 16   Wt 99.8 kg   LMP 11/15/2015 Comment: spotting 11-15-15  SpO2 98%   BMI 40.24 kg/m  Physical Exam Vitals and nursing note reviewed.  Constitutional:      General: She is not in acute distress.    Appearance: She is well-developed. She is obese.  HENT:     Head: Normocephalic and atraumatic.  Eyes:     Conjunctiva/sclera: Conjunctivae normal.  Cardiovascular:     Rate and Rhythm: Normal rate and regular rhythm.     Heart sounds: No murmur heard. Pulmonary:     Effort: Pulmonary effort is normal. No respiratory distress.     Breath sounds: Normal breath sounds.  Abdominal:     Palpations: Abdomen is soft.     Tenderness: There is no abdominal tenderness.  Musculoskeletal:        General: No  swelling.     Cervical back: Neck supple.     Right lower leg: No edema.     Left lower leg: No edema.  Skin:    General: Skin is warm and dry.     Capillary Refill: Capillary refill takes less than 2 seconds.  Neurological:     Mental Status: She is alert.     Comments: MENTAL STATUS EXAM:    Orientation: Alert and oriented to person, place and time.  Memory: Cooperative, follows commands well.  Language: Speech is clear and language is normal.   CRANIAL NERVES:  CN 2 (Optic): Visual fields intact to confrontation.  CN 3,4,6 (EOM): Pupils equal and reactive to light. Full extraocular eye movement without nystagmus.  CN 5 (Trigeminal): Facial sensation is normal, no weakness of masticatory muscles.  CN 7 (Facial): No facial weakness or asymmetry.  CN 8 (Auditory): Auditory acuity grossly normal.  CN 9,10 (Glossophar): The uvula is midline, the palate elevates symmetrically.  CN 11 (spinal access): Normal sternocleidomastoid and trapezius strength.  CN 12 (Hypoglossal): The tongue is midline. No atrophy or fasciculations.Marland Kitchen   MOTOR:  Muscle Strength: 5/5RUE, 5/5LUE, 5/5RLE, 5/5LLE.   COORDINATION:   No tremor.   SENSATION:   Intact to light touch all four extremities.  GAIT: Gait normal without ataxia   Psychiatric:        Mood and Affect: Mood normal.     ED Results / Procedures / Treatments   Labs (all labs ordered are listed, but only abnormal results are displayed) Labs Reviewed  BASIC METABOLIC PANEL - Abnormal; Notable for the following components:      Result Value   Glucose, Bld 116 (*)    All other components within normal limits  D-DIMER, QUANTITATIVE - Abnormal; Notable for the following components:   D-Dimer, Quant 0.89 (*)    All other components within normal limits  CBC  TROPONIN I (HIGH SENSITIVITY)  TROPONIN I (HIGH SENSITIVITY)    EKG EKG Interpretation Date/Time:  Tuesday March 06 2023 18:18:59 EST Ventricular Rate:  84 PR  Interval:  188 QRS Duration:  74 QT Interval:  356 QTC Calculation: 420 R Axis:   29  Text Interpretation: Normal sinus rhythm Anteroseptal infarct (cited on or before 28-Feb-2022) Abnormal ECG When compared with ECG of 28-Feb-2022 15:04, No significant change was found Confirmed by Ernie Avena ([id number]) on 03/06/2023 9:52:26 PM  Radiology DG Chest 2 View Result Date: 03/06/2023 CLINICAL DATA:  COVID positive x1 week presenting with left-sided chest pain and upper back pain. EXAM: CHEST - 2 VIEW COMPARISON:  None Available. FINDINGS: The heart size and mediastinal contours are within normal limits. Both lungs are clear. Radiopaque fixation plate and screws are seen overlying the lower cervical and upper thoracic spine. Stable chronic changes are seen involving the right shoulder. There is mild levoscoliosis of the mid to lower thoracic spine with multilevel degenerative changes. IMPRESSION: No active cardiopulmonary disease. Electronically Signed   By: Aram Candela M.D.   On: 03/06/2023 20:35    Procedures Procedures    Medications Ordered in ED Medications  sodium chloride 0.9 % bolus 1,000 mL (1,000 mLs Intravenous New Bag/Given 03/06/23 2212)  iohexol (OMNIPAQUE) 350 MG/ML injection 100 mL (75 mLs Intravenous Contrast Given 03/06/23 2259)    ED Course/ Medical Decision Making/ A&P Clinical Course as of 03/06/23 2336  Tue Mar 06, 2023  2241 D-Dimer, Quant(!): 0.89 [JL]    Clinical Course User Index [JL] Ernie Avena, MD                                 Medical Decision Making Amount and/or Complexity of Data Reviewed Labs: ordered. Decision-making details documented in ED Course. Radiology: ordered.  Risk Prescription drug management.     73 year old female with medical history significant for morbid obesity, HLD, HTN, osteoarthritis, asthma, presenting to the emergency department with persistent symptoms of COVID-19.  The patient states that she was diagnosed with COVID 1  week ago.  She states that this is the  first time she has been diagnosed with COVID-19.  She endorses persistent shortness of breath, describes slightly pleuritic left-sided chest discomfort that she had been previously told was musculoskeletal.  She endorses persistent cough and mild dyspnea.  Endorses lightheadedness and fatigue.  She had difficulty with low blood pressure but states that she has been pushing oral fluids over the past few days.  She stopped her home blood pressure medications.  She denies any active vomiting or diarrhea.  She denies any abdominal pain.  No strokelike symptoms, no room spinning dizziness.  Endorses lightheadedness and near syncope.  On arrival, the patient was afebrile, not tachycardic or tachypneic, hemodynamically stable, saturating well on room air.  Physical exam revealed a normal neurologic exam, lungs that were CTAB.  Sinus rhythm noted on cardiac telemetry.  Patient presenting with lightheadedness and worsening symptoms of COVID, consider development meant of pneumonia, PE.  IV access was obtained and the patient was administered IV fluid bolus.  Orthostatics prior to fluids were negative for orthostatic hypotension.  Low concern for acute CVA.  Laboratory evaluation revealed BNP which was normal, troponins x 2 negative, CBC without a leukocytosis or anemia.  A D-dimer was collected and was elevated at 0.89.  Will proceed further with CTA imaging to evaluate for possible PE.  Chest x-ray revealed no focal consolidation/pneumonia.  Plan at time of signout to follow-up results of CT imaging, disposition appropriately pending results of diagnostic testing and reassessment.  Signout given to Dr. Adela Lank at 2300.   Final Clinical Impression(s) / ED Diagnoses Final diagnoses:  COVID-19    Rx / DC Orders ED Discharge Orders     None         Ernie Avena, MD 03/06/23 2336

## 2023-03-07 NOTE — Discharge Instructions (Signed)
 Your CT scan of the chest does not show a blood clot in the lung.  I think you having trouble breathing due to COVID.  Please follow-up with your family doctor in the office.  Please return for worsening difficulty breathing.

## 2023-03-21 DIAGNOSIS — D225 Melanocytic nevi of trunk: Secondary | ICD-10-CM | POA: Diagnosis not present

## 2023-03-21 DIAGNOSIS — L821 Other seborrheic keratosis: Secondary | ICD-10-CM | POA: Diagnosis not present

## 2023-03-21 DIAGNOSIS — Z85828 Personal history of other malignant neoplasm of skin: Secondary | ICD-10-CM | POA: Diagnosis not present

## 2023-03-21 DIAGNOSIS — D2239 Melanocytic nevi of other parts of face: Secondary | ICD-10-CM | POA: Diagnosis not present

## 2023-03-21 DIAGNOSIS — L570 Actinic keratosis: Secondary | ICD-10-CM | POA: Diagnosis not present

## 2023-03-21 DIAGNOSIS — L814 Other melanin hyperpigmentation: Secondary | ICD-10-CM | POA: Diagnosis not present

## 2023-03-21 DIAGNOSIS — L738 Other specified follicular disorders: Secondary | ICD-10-CM | POA: Diagnosis not present

## 2023-04-20 ENCOUNTER — Encounter: Payer: Self-pay | Admitting: Sports Medicine

## 2023-04-20 ENCOUNTER — Other Ambulatory Visit: Payer: Self-pay | Admitting: Orthopedic Surgery

## 2023-04-20 MED ORDER — CYCLOBENZAPRINE HCL 5 MG PO TABS: 5.0000 mg | ORAL_TABLET | Freq: Three times a day (TID) | ORAL | 0 refills | Status: DC | PRN

## 2023-04-26 ENCOUNTER — Other Ambulatory Visit: Payer: Self-pay | Admitting: Orthopaedic Surgery

## 2023-05-01 ENCOUNTER — Other Ambulatory Visit: Payer: Self-pay | Admitting: Sports Medicine

## 2023-05-01 MED ORDER — CYCLOBENZAPRINE HCL 5 MG PO TABS: 5.0000 mg | ORAL_TABLET | Freq: Three times a day (TID) | ORAL | 0 refills | Status: DC | PRN

## 2023-05-21 ENCOUNTER — Other Ambulatory Visit: Payer: Self-pay | Admitting: Sports Medicine

## 2023-06-11 ENCOUNTER — Other Ambulatory Visit: Payer: Self-pay | Admitting: Sports Medicine

## 2023-06-23 IMAGING — CR DG CHEST 2V
2 series · 2 of 2 positions shown · non-contrast
Comparison: 03/09/2020

CLINICAL DATA: Shortness of breath

EXAM:
CHEST - 2 VIEW

[chest pa]
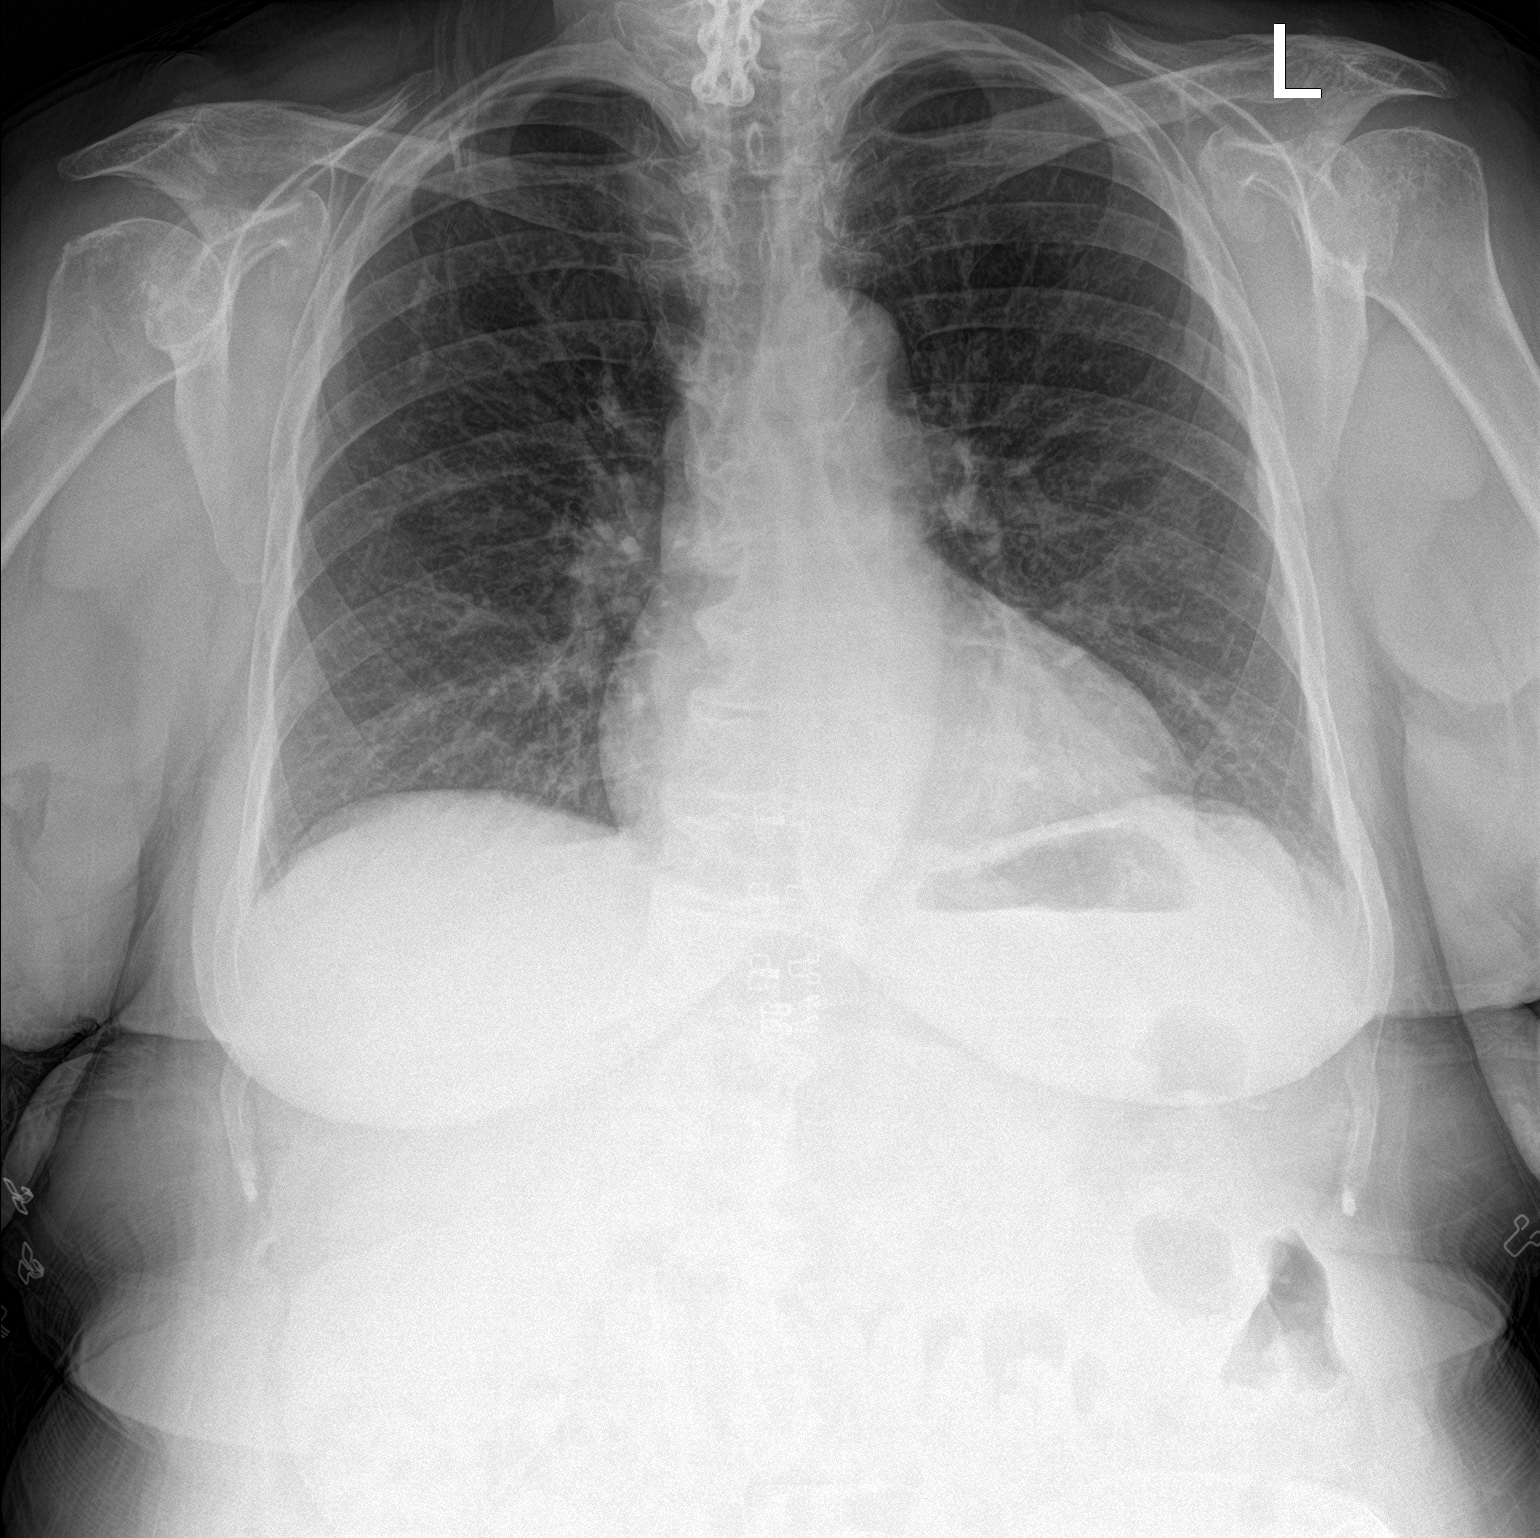

[chest lat]
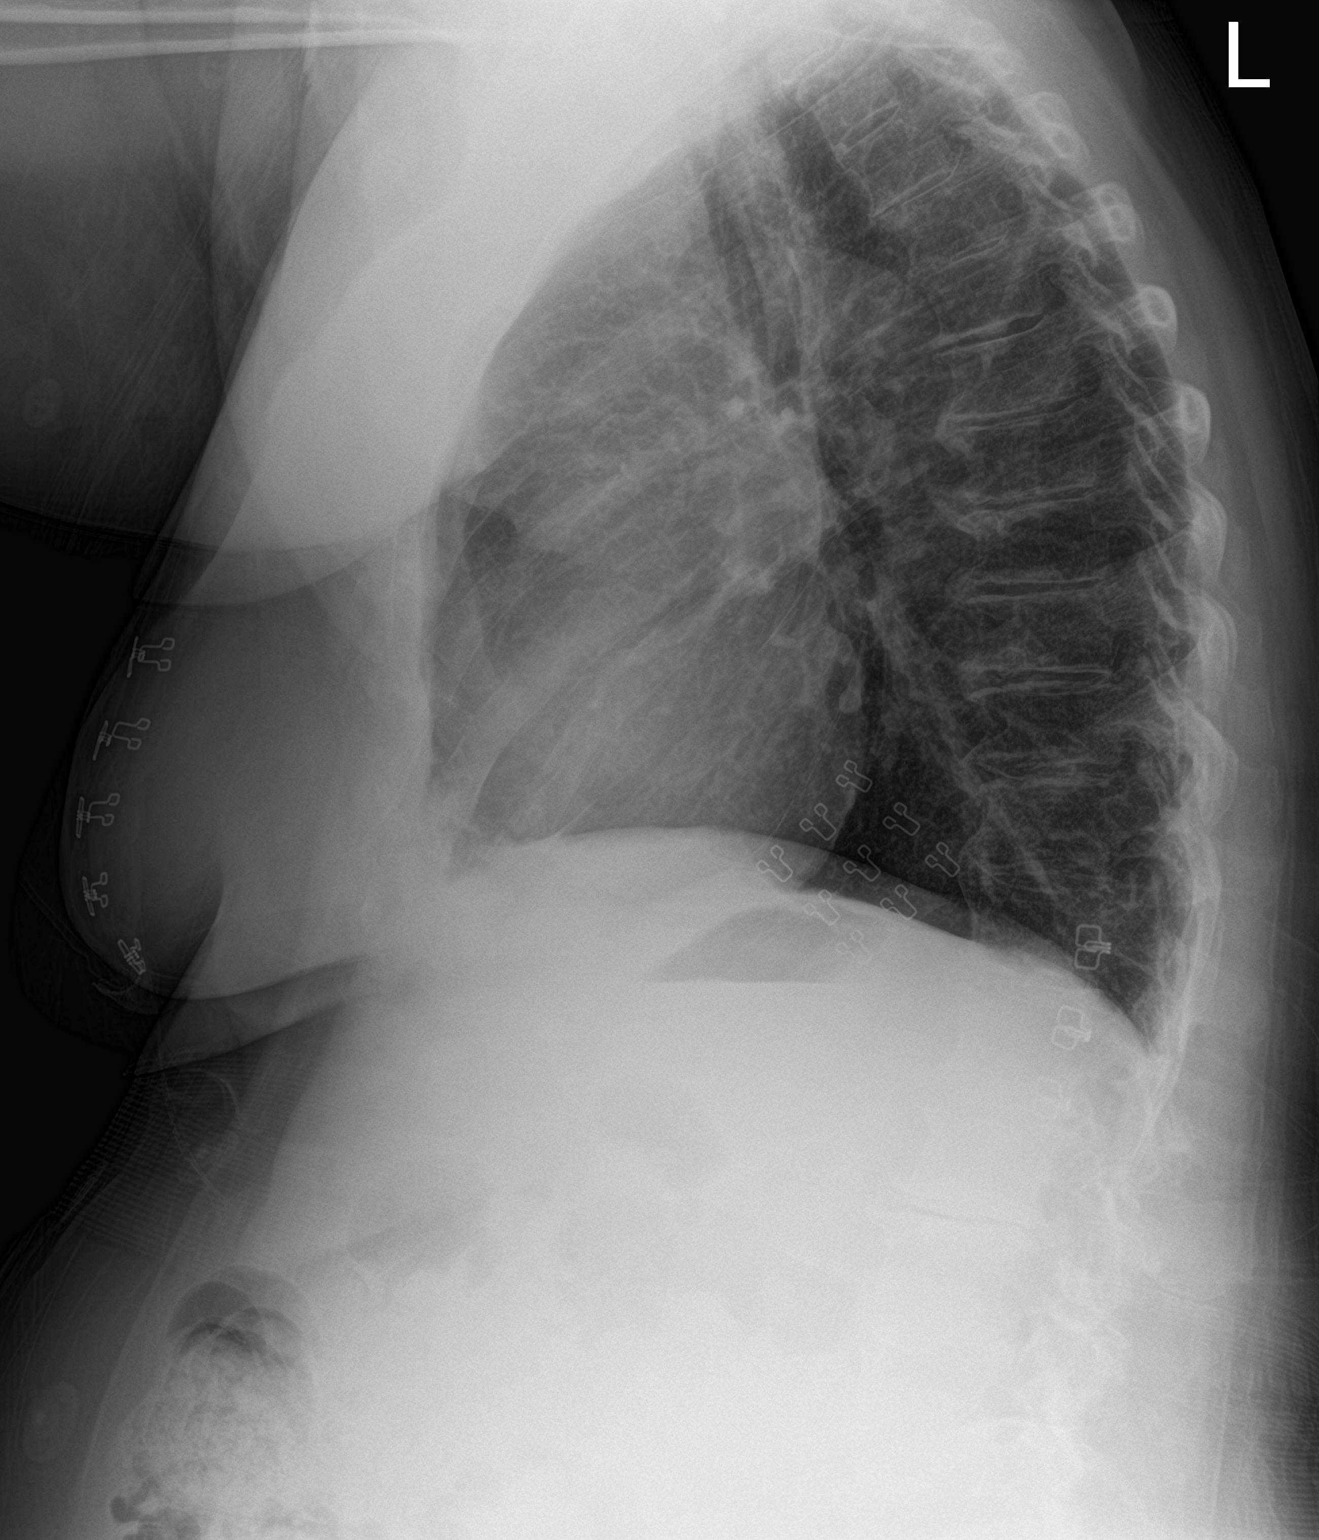

[2 of 2 positions shown; findings below may reference images not displayed]

FINDINGS: Heart and mediastinal contours are within normal limits. No focal
opacities or effusions. No acute bony abnormality.
IMPRESSION: No active cardiopulmonary disease.

## 2023-06-24 ENCOUNTER — Other Ambulatory Visit: Payer: Self-pay | Admitting: Physician Assistant

## 2023-07-02 DIAGNOSIS — L299 Pruritus, unspecified: Secondary | ICD-10-CM | POA: Diagnosis not present

## 2023-07-02 DIAGNOSIS — R7301 Impaired fasting glucose: Secondary | ICD-10-CM | POA: Diagnosis not present

## 2023-07-02 DIAGNOSIS — H61893 Other specified disorders of external ear, bilateral: Secondary | ICD-10-CM | POA: Diagnosis not present

## 2023-07-02 DIAGNOSIS — F439 Reaction to severe stress, unspecified: Secondary | ICD-10-CM | POA: Diagnosis not present

## 2023-07-02 DIAGNOSIS — Z0000 Encounter for general adult medical examination without abnormal findings: Secondary | ICD-10-CM | POA: Diagnosis not present

## 2023-07-02 DIAGNOSIS — J4540 Moderate persistent asthma, uncomplicated: Secondary | ICD-10-CM | POA: Diagnosis not present

## 2023-07-02 DIAGNOSIS — F330 Major depressive disorder, recurrent, mild: Secondary | ICD-10-CM | POA: Diagnosis not present

## 2023-07-02 DIAGNOSIS — R413 Other amnesia: Secondary | ICD-10-CM | POA: Diagnosis not present

## 2023-07-02 DIAGNOSIS — R197 Diarrhea, unspecified: Secondary | ICD-10-CM | POA: Diagnosis not present

## 2023-07-02 DIAGNOSIS — E785 Hyperlipidemia, unspecified: Secondary | ICD-10-CM | POA: Diagnosis not present

## 2023-07-02 DIAGNOSIS — I10 Essential (primary) hypertension: Secondary | ICD-10-CM | POA: Diagnosis not present

## 2023-07-02 DIAGNOSIS — I700 Atherosclerosis of aorta: Secondary | ICD-10-CM | POA: Diagnosis not present

## 2023-07-12 ENCOUNTER — Ambulatory Visit
Admission: RE | Admit: 2023-07-12 | Discharge: 2023-07-12 | Disposition: A | Discharge status: Home / Self Care | Payer: Medicare Other | Source: Ambulatory Visit | Attending: Family Medicine | Admitting: Family Medicine

## 2023-07-12 DIAGNOSIS — H2513 Age-related nuclear cataract, bilateral: Secondary | ICD-10-CM | POA: Diagnosis not present

## 2023-07-12 DIAGNOSIS — Z1231 Encounter for screening mammogram for malignant neoplasm of breast: Secondary | ICD-10-CM | POA: Diagnosis not present

## 2023-07-13 ENCOUNTER — Encounter: Payer: Self-pay | Admitting: Physician Assistant

## 2023-07-17 ENCOUNTER — Encounter: Payer: Self-pay | Admitting: Sports Medicine

## 2023-07-17 ENCOUNTER — Ambulatory Visit (INDEPENDENT_AMBULATORY_CARE_PROVIDER_SITE_OTHER): Admitting: Sports Medicine

## 2023-07-17 ENCOUNTER — Other Ambulatory Visit: Payer: Self-pay

## 2023-07-17 DIAGNOSIS — M25511 Pain in right shoulder: Secondary | ICD-10-CM

## 2023-07-17 DIAGNOSIS — G8929 Other chronic pain: Secondary | ICD-10-CM | POA: Diagnosis not present

## 2023-07-17 DIAGNOSIS — M12811 Other specific arthropathies, not elsewhere classified, right shoulder: Secondary | ICD-10-CM

## 2023-07-17 DIAGNOSIS — M19011 Primary osteoarthritis, right shoulder: Secondary | ICD-10-CM | POA: Diagnosis not present

## 2023-07-17 MED ORDER — METHYLPREDNISOLONE ACETATE 40 MG/ML IJ SUSP
80.0000 mg | INTRAMUSCULAR | Status: AC | PRN
  Administered 2023-07-17: 80 mg via INTRA_ARTICULAR

## 2023-07-17 MED ORDER — BUPIVACAINE HCL 0.25 % IJ SOLN
2.0000 mL | INTRAMUSCULAR | Status: AC | PRN
Start: 2023-07-17 — End: 2023-07-17
  Administered 2023-07-17: 2 mL via INTRA_ARTICULAR

## 2023-07-17 MED ORDER — LIDOCAINE HCL 1 % IJ SOLN
2.0000 mL | INTRAMUSCULAR | Status: AC | PRN
  Administered 2023-07-17: 2 mL

## 2023-07-17 NOTE — Progress Notes (Signed)
 Patient says that she got very little relief from the last injection. She is taking the Diclofenac  once a day which does help, although she is still restricted in her ROM and overall use of the right arm. She mentions a surgery years ago for the left shoulder where they shaved away part of the bone and opened up the space under the clavicle, and is inquiring about a similar procedure for the right shoulder. She also has questions about shoulder replacement. She continues to do her exercises daily, which she does feel is helping to maintain the motion she currently has. She would like to try another injection today.

## 2023-07-17 NOTE — Progress Notes (Signed)
 [name] - 73 y.o. female MRN [id number]  Date of birth: 11-Oct-1950  Office Visit Note: Visit Date: 07/17/2023 PCP: Dyane Anthony RAMAN, FNP Referred by: Dyane Anthony RAMAN, FNP  Subjective: Chief Complaint  Patient presents with   Right Shoulder - Pain   HPI: [name] is a pleasant 73 y.o. female who presents today for acute on chronic right shoulder pain with current exacerbation. She is RHD.  I last saw Luke back in February of this year where we did perform an ultrasound-guided right intra-articular shoulder injection.  This did not resolve her pain 100% but she does report she had quite good reduction in her pain and improvement in range of motion following this injection for 2 to 3 weeks before then this wears off and her pain returned.  Since that time her pain has continued to be progressive and is limiting her range of motion in all planes.  She reports a clicking/grating sensation with all motion.  She has used oral diclofenac  75 mg once to twice daily as needed for pain, she also has cyclobenzaprine  that she will take for muscle related pains, has not been taking these as consistently recently.  She is interested in possible surgical intervention and has questions regarding shoulder replacement and other operative treatments that could be pursued.  Pertinent ROS were reviewed with the patient and found to be negative unless otherwise specified above in HPI.   Assessment & Plan: Visit Diagnoses:  1. Primary osteoarthritis, right shoulder   2. Chronic right shoulder pain   3. Rotator cuff arthropathy of right shoulder    Plan: Impression is chronic right shoulder pain with current exacerbation of her pain and restriction in range of motion.  She does have advanced osteoarthritis of the right shoulder but also has likely underlying rotator cuff arthropathy as well.  Given the degree of her arthritic change, I do think she would be a candidate for consideration of shoulder  arthroplasty.  We discussed today that we would need to further evaluate the rotator cuff with MRI to see the integrity and quality of the tendons to help guide surgical management which may include reverse total shoulder arthroplasty versus routine TSA.  We will move forward with MRI arthrogram of the right shoulder to evaluate and have her follow-up with Dr. Addie to review this and discuss surgical intervention that may be considered, I did discuss general overview with her today.  Given that her pain is quite significant, she is interested in proceeding with an additional ultrasound-guided injection into the shoulder joint, this was performed today patient tolerated well.  Did discuss I would like her to wait about 3 weeks or later from today's injections before moving with MRI arthrogram did not create artifact, she is agreeable and understanding.  In the short-term over the next few days I would like her to take her oral diclofenac  75 mg once daily, can consider p.m. dosing only if needed.  She will do this for the next 3 days and then as her pain improves once the injection kicks in she may wean herself off of this.  Okay to use cyclobenzaprine  5 mg nightly as needed.  Follow-up: Return for Make appt with Dr. Addie after MRI to review and discuss surgical options.   Meds & Orders: No orders of the defined types were placed in this encounter.   Orders Placed This Encounter  Procedures   Large Joint Inj   US  Guided Needle Placement - No  Linked Charges   MR SHOULDER RIGHT W CONTRAST   Arthrogram     Procedures: Large Joint Inj: R glenohumeral on 07/17/2023 5:20 PM Indications: pain Details: 22 G 3.5 in needle, ultrasound-guided posterior approach Medications: 2 mL lidocaine  1 %; 2 mL bupivacaine  0.25 %; 80 mg methylPREDNISolone  acetate 40 MG/ML Outcome: tolerated well, no immediate complications  US -guided glenohumeral joint injection, Right shoulder After discussion on  risks/benefits/indications, informed verbal consent was obtained. A timeout was then performed. The patient was positioned lying lateral recumbent on examination table. The patient's shoulder was prepped with betadine and multiple alcohol swabs and utilizing ultrasound guidance, the patient's glenohumeral joint was identified on ultrasound. Using ultrasound guidance a 22-gauge, 3.5 inch needle with a mixture of 2:2:2 cc's lidocaine :bupivicaine:depomedrol was directed from a lateral to medial direction via in-plane technique into the glenohumeral joint with visualization of appropriate spread of injectate into the joint. Patient tolerated the procedure well without immediate complications.      Procedure, treatment alternatives, risks and benefits explained, specific risks discussed. Consent was given by the patient. Immediately prior to procedure a time out was called to verify the correct patient, procedure, equipment, support staff and site/side marked as required. Patient was prepped and draped in the usual sterile fashion.          Clinical History: No specialty comments available.  She reports that she has never smoked. She has never used smokeless tobacco. No results for input(s): HGBA1C, LABURIC in the last 8760 hours.  Objective:   Vital Signs: LMP 11/15/2015 Comment: spotting 11-15-15  Physical Exam  Gen: Well-appearing, in no acute distress; non-toxic CV: Well-perfused. Warm.  Resp: Breathing unlabored on room air; no wheezing. Psych: Fluid speech in conversation; appropriate affect; normal thought process  Ortho Exam - Right shoulder: She has significant restriction in range of motion in all planes both active and passively.  There is grating sensation within the shoulder joint through all planes of motion.  Trace effusion noted in the shoulder joint.  There is positive drop arm test and weakness with rotator cuff activation in addition.  Imaging: No results found.  Past  Medical/Family/Surgical/Social History: Medications & Allergies reviewed per EMR, new medications updated. Patient Active Problem List   Diagnosis Date Noted   PND (post-nasal drip) 03/18/2021   Asthma attack 03/17/2021   Dyslipidemia 03/14/2021   Asthma exacerbation 03/14/2021   Acute asthma exacerbation 03/13/2021   Osteoarthritis of left knee 08/10/2015   Status post total left knee replacement 08/10/2015   Osteoarthritis of left hip 11/02/2014   Status post total replacement of left hip 11/02/2014   Osteoarthritis of right hip 09/08/2014   Status post total replacement of right hip 09/08/2014   PMB (postmenopausal bleeding) 02/28/2013   Morbid obesity (HCC) 02/28/2013   Pure hypercholesterolemia 02/28/2013   Essential hypertension, benign 02/28/2013   Past Medical History:  Diagnosis Date   Abnormal Pap smear of cervix 03/2009   Colpo Biopsy CIN I with HPV   Arthritis    Asthma    Bilateral edema of lower extremity    Takes lasix  if needed   Bronchitis    Disc disorder    bulging disc in thoracic area   GERD (gastroesophageal reflux disease)    occ   Hypercholesteremia    Hypertension since 1990's   Lumbar herniated disc    Obesity    Pneumonia    hx   Scoliosis    Spinal stenosis    Teeth grinding  Family History  Problem Relation Age of Onset   Breast cancer Sister 2       bilateral mastectomy- Breast cancer    Hypertension Mother    Heart disease Mother    Stroke Mother    Heart disease Father    Depression Father    Alcohol abuse Father    Stroke Maternal Grandmother    Emphysema Paternal Grandmother    Breast cancer Maternal Aunt        Both aunts in 9 -40's   Heart failure Paternal Grandfather    Alcohol abuse Paternal Grandfather    Depression Daughter    Past Surgical History:  Procedure Laterality Date   APPENDECTOMY  1965   BACK SURGERY  1993   Thoracic and Lumbar   BREAST BIOPSY Right 1990   benign cyst   BREAST EXCISIONAL BIOPSY  Right 1990   benign   CERVICAL DISC SURGERY  2007   COLONOSCOPY W/ POLYPECTOMY     HERNIA REPAIR  1977, as child   bilateral inguinal -1 during pregnancy.  Umbilical repair as child   KNEE ARTHROSCOPY Right 1990   LUMBAR DISC SURGERY  2006   SHOULDER ARTHROSCOPY Left 1994   arthritis, hips,fingers   TONSILLECTOMY AND ADENOIDECTOMY     TOTAL HIP ARTHROPLASTY Right 09/08/2014   Procedure: RIGHT TOTAL HIP ARTHROPLASTY ANTERIOR APPROACH;  Surgeon: Lonni CINDERELLA Poli, MD;  Location: [facility] OR;  Service: Orthopedics;  Laterality: Right;   TOTAL HIP ARTHROPLASTY Left 11/02/2014   Procedure: LEFT TOTAL HIP ARTHROPLASTY ANTERIOR APPROACH;  Surgeon: Lonni CINDERELLA Poli, MD;  Location: [facility] OR;  Service: Orthopedics;  Laterality: Left;   TOTAL KNEE ARTHROPLASTY Right 2009   TOTAL KNEE ARTHROPLASTY Left 08/10/2015   Procedure: LEFT TOTAL KNEE ARTHROPLASTY;  Surgeon: Lonni CINDERELLA Poli, MD;  Location: [facility];  Service: Orthopedics;  Laterality: Left;   Social History   Occupational History   Not on file  Tobacco Use   Smoking status: Never   Smokeless tobacco: Never  Vaping Use   Vaping status: Never Used  Substance and Sexual Activity   Alcohol use: Not Currently    Comment: occasionally    Drug use: No   Sexual activity: Not Currently    Partners: Male    Birth control/protection: Post-menopausal   I spent 36 minutes in the care of the patient today including face-to-face time, preparation to see the patient, as well as review of previous x-ray imaging, review of orthopedic notes related to procedures and injections for the shoulder, time spent discussing need for additional imaging, general discussion of both total shoulder arthroplasty and reverse total shoulder arthroplasty that could be performed by one of my orthopedic colleagues, medication management for the above diagnoses.   Lonell Sprang, DO Primary Care Sports Medicine Physician  [facility] - Orthopedics  This note  was dictated using Dragon naturally speaking software and may contain errors in syntax, spelling, or content which have not been identified prior to signing this note.

## 2023-07-25 ENCOUNTER — Ambulatory Visit: Admitting: Physician Assistant

## 2023-07-25 ENCOUNTER — Ambulatory Visit

## 2023-07-26 ENCOUNTER — Other Ambulatory Visit: Payer: Self-pay | Admitting: Sports Medicine

## 2023-07-31 ENCOUNTER — Other Ambulatory Visit: Payer: Self-pay

## 2023-07-31 ENCOUNTER — Emergency Department (HOSPITAL_BASED_OUTPATIENT_CLINIC_OR_DEPARTMENT_OTHER)
Admission: EM | Admit: 2023-07-31 | Discharge: 2023-07-31 | Disposition: A | Discharge status: Home / Self Care | Attending: Emergency Medicine | Admitting: Emergency Medicine

## 2023-07-31 ENCOUNTER — Emergency Department (HOSPITAL_BASED_OUTPATIENT_CLINIC_OR_DEPARTMENT_OTHER): Admitting: Radiology

## 2023-07-31 ENCOUNTER — Encounter (HOSPITAL_BASED_OUTPATIENT_CLINIC_OR_DEPARTMENT_OTHER): Payer: Self-pay

## 2023-07-31 DIAGNOSIS — I10 Essential (primary) hypertension: Secondary | ICD-10-CM | POA: Diagnosis not present

## 2023-07-31 DIAGNOSIS — R0789 Other chest pain: Secondary | ICD-10-CM | POA: Insufficient documentation

## 2023-07-31 DIAGNOSIS — Z7951 Long term (current) use of inhaled steroids: Secondary | ICD-10-CM | POA: Insufficient documentation

## 2023-07-31 DIAGNOSIS — Z79899 Other long term (current) drug therapy: Secondary | ICD-10-CM | POA: Insufficient documentation

## 2023-07-31 DIAGNOSIS — Z4789 Encounter for other orthopedic aftercare: Secondary | ICD-10-CM | POA: Diagnosis not present

## 2023-07-31 DIAGNOSIS — J45909 Unspecified asthma, uncomplicated: Secondary | ICD-10-CM | POA: Insufficient documentation

## 2023-07-31 DIAGNOSIS — X500XXA Overexertion from strenuous movement or load, initial encounter: Secondary | ICD-10-CM | POA: Diagnosis not present

## 2023-07-31 DIAGNOSIS — R079 Chest pain, unspecified: Secondary | ICD-10-CM | POA: Diagnosis not present

## 2023-07-31 DIAGNOSIS — Z7982 Long term (current) use of aspirin: Secondary | ICD-10-CM | POA: Insufficient documentation

## 2023-07-31 LAB — CBC
HCT: 42.3 % (ref 36.0–46.0)
Hemoglobin: 13.7 g/dL (ref 12.0–15.0)
MCH: 28.9 pg (ref 26.0–34.0)
MCHC: 32.4 g/dL (ref 30.0–36.0)
MCV: 89.2 fL (ref 80.0–100.0)
Platelets: 216 K/uL (ref 150–400)
RBC: 4.74 MIL/uL (ref 3.87–5.11)
RDW: 13.9 % (ref 11.5–15.5)
WBC: 8.3 K/uL (ref 4.0–10.5)
nRBC: 0 % (ref 0.0–0.2)

## 2023-07-31 LAB — TROPONIN T, HIGH SENSITIVITY
Troponin T High Sensitivity: 15 ng/L (ref <19)
Troponin T High Sensitivity: <15 ng/L (ref <19)

## 2023-07-31 LAB — BASIC METABOLIC PANEL WITH GFR
Anion gap: 12 (ref 5–15)
BUN: 18 mg/dL (ref 8–23)
CO2: 24 mmol/L (ref 22–32)
Calcium: 9.3 mg/dL (ref 8.9–10.3)
Chloride: 106 mmol/L (ref 98–111)
Creatinine, Ser: 0.84 mg/dL (ref 0.44–1.00)
GFR, Estimated: >60 mL/min (ref >60)
Glucose, Bld: 88 mg/dL (ref 70–99)
Potassium: 4.2 mmol/L (ref 3.5–5.1)
Sodium: 142 mmol/L (ref 135–145)

## 2023-07-31 NOTE — ED Notes (Signed)
 Dizziness resolved per pt, ow ambulatory without assistance

## 2023-07-31 NOTE — ED Triage Notes (Signed)
 Pt c/o a lot of squeezing in my chest onset approx ago. Advises back pain as well, unsure if directly related. Baby ASA PTA  Prednisone  shot approx 2wks ago for R shoulder

## 2023-07-31 NOTE — Discharge Instructions (Signed)
 Please use Tylenol  or ibuprofen for pain.  You may use 600 mg ibuprofen every 6 hours or 1000 mg of Tylenol  every 6 hours.  You may choose to alternate between the 2.  This would be most effective.  Not to exceed 4 g of Tylenol  within 24 hours.  Not to exceed 3200 mg ibuprofen 24 hours.  Follow up with the cardiologist -- they should give you a call to schedule an appointment sometime this week.

## 2023-07-31 NOTE — ED Provider Notes (Signed)
 [place] [facility] AT [facility] Provider Note   CSN: [id number] Arrival date & time: 07/31/23  1529     Patient presents with: Chest Pain   MERYEM HAERTEL is a 73 y.o. female with past medical history significant for obesity, hyperlipidemia, hypertension, osteoarthritis, asthma who presents concern for squeezing chest pain onset around 45 minutes ago while doing some lifting.  She rated the pain 7/10 at this time.  She reports it is resolved at this time.  She reports that she occasionally feels shortness of breath or asthma but generally does not have shortness of breath or chest pain with exertion.  Echo reviewed from 2023 with no evidence of heart failure.    Chest Pain      Prior to Admission medications   Medication Sig Start Date End Date Taking? Authorizing Provider  albuterol  (VENTOLIN  HFA) 108 (90 Base) MCG/ACT inhaler Inhale 2 puffs into the lungs every 6 (six) hours as needed for wheezing or shortness of breath.    [provider]  Alpha Lipoic Acid 200 MG CAPS Take 200 mg by mouth daily.    [provider]  aspirin  81 MG EC tablet Take 81 mg by mouth See admin instructions. **pt takes twice a week** Monday and Thursday    [provider]  azelastine  (ASTELIN ) 0.1 % nasal spray Place 2 sprays into both nostrils 2 (two) times daily. Use in each nostril as directed 03/18/21   Earley Saucer, MD  chlorpheniramine-HYDROcodone  10-8 MG/5ML Take 5 mLs by mouth at bedtime. 03/09/21   [provider]  cholecalciferol  (VITAMIN D ) 1000 UNITS tablet Take 1,000 Units by mouth 2 (two) times daily.    [provider]  Cinnamon 500 MG capsule Take 500 mg by mouth 2 (two) times daily.    [provider]  ciprofloxacin -dexamethasone  (CIPRODEX ) OTIC suspension Place 4 drops into both ears 2 (two) times daily. 02/28/22   Logan Ubaldo KATHEE, PA-C  Coenzyme Q10 (CO Q-10) 200 MG CAPS Take 200 mg by mouth every evening.     [provider]  cyclobenzaprine  (FLEXERIL ) 5 MG tablet TAKE 1 TABLET (5 MG TOTAL) BY MOUTH 3 (THREE) TIMES DAILY AS NEEDED FOR UP TO 21 DAYS FOR MUSCLE SPASMS. 07/26/23 08/16/23  Magnant, Charles L, PA-C  diclofenac  (VOLTAREN ) 75 MG EC tablet TAKE 1 TABLET TWICE A DAY AS NEEDED FOR MILD PAIN 06/25/23   Gretta Bertrum ORN, PA-C  docusate sodium  (COLACE) 100 MG capsule Take 200 mg by mouth at bedtime.    [provider]  felodipine  (PLENDIL ) 10 MG 24 hr tablet Take 10 mg by mouth daily.    [provider]  FLAXSEED, LINSEED, PO Take 1,300 mg by mouth daily.     [provider]  Garlic  1000 MG CAPS Take 1,000 mg by mouth daily.    [provider]  Grape Seed 50 MG TABS Take 50 mg by mouth daily.    [provider]  Guaifenesin  1200 MG TB12 Take 1,200 mg by mouth 2 (two) times daily.    [provider]  ipratropium-albuterol  (DUONEB) 0.5-2.5 (3) MG/3ML SOLN Take 3 mLs by nebulization every 6 (six) hours as needed (shortness of breath). 03/18/21 04/17/21  Rizwan, Saima, MD  irbesartan  (AVAPRO ) 300 MG tablet Take 300 mg by mouth daily.    [provider]  labetalol  (NORMODYNE ) 200 MG tablet Take 400 mg by mouth 2 (two) times daily.     [provider]  lactobacillus acidophilus (BACID)  TABS tablet Take 1 tablet by mouth every morning.    [provider]  Lecithin 1200 MG CAPS Take 1,200 mg by mouth daily.    [provider]  levofloxacin  (LEVAQUIN ) 500 MG tablet Take 1 tablet (500 mg total) by mouth daily. 02/28/22   Logan Ubaldo NOVAK, PA-C  Lutein  20 MG TABS Take 20 mg by mouth daily.    [provider]  meclizine  (ANTIVERT ) 25 MG tablet     [provider]  Multiple Vitamin (MULTIVITAMIN WITH MINERALS) TABS tablet Take 0.5 tablets by mouth 2 (two) times daily.     [provider]  Omega 3 1200 MG CAPS Take 1,200 mg by mouth 2 (two) times daily.    [provider]  simvastatin   (ZOCOR ) 20 MG tablet Take 20 mg by mouth at bedtime.     [provider]  sodium chloride  (OCEAN) 0.65 % SOLN nasal spray Place 1 spray into both nostrils as needed for congestion.    [provider]  vitamin C  (ASCORBIC ACID ) 500 MG tablet Take 500 mg by mouth daily.    [provider]    Allergies: Etodolac and Adhesive [tape]    Review of Systems  Cardiovascular:  Positive for chest pain.  All other systems reviewed and are negative.   Updated Vital Signs BP 125/67   Pulse 66   Temp 98.2 F (36.8 C) (Oral)   Resp 15   LMP 11/15/2015 Comment: spotting 11-15-15  SpO2 98%   Physical Exam Vitals and nursing note reviewed.  Constitutional:      General: She is not in acute distress.    Appearance: Normal appearance.  HENT:     Head: Normocephalic and atraumatic.  Eyes:     General:        Right eye: No discharge.        Left eye: No discharge.  Cardiovascular:     Rate and Rhythm: Normal rate and regular rhythm.     Heart sounds: No murmur heard.    No friction rub. No gallop.  Pulmonary:     Effort: Pulmonary effort is normal.     Breath sounds: Normal breath sounds.  Abdominal:     General: Bowel sounds are normal.     Palpations: Abdomen is soft.  Skin:    General: Skin is warm and dry.     Capillary Refill: Capillary refill takes less than 2 seconds.  Neurological:     Mental Status: She is alert and oriented to person, place, and time.  Psychiatric:        Mood and Affect: Mood normal.        Behavior: Behavior normal.     (all labs ordered are listed, but only abnormal results are displayed) Labs Reviewed  BASIC METABOLIC PANEL WITH GFR  CBC  TROPONIN T, HIGH SENSITIVITY  TROPONIN T, HIGH SENSITIVITY    EKG: EKG Interpretation Date/Time:  Tuesday July 31 2023 15:38:19 EDT Ventricular Rate:  79 PR Interval:  199 QRS Duration:  90 QT Interval:  365 QTC Calculation: 419 R Axis:   3  Text Interpretation: Sinus rhythm  Anterior infarct, old Confirmed by Cottie Cough [phone]) on 07/31/2023 3:39:28 PM  Radiology: DG Chest 2 View Result Date: 07/31/2023 CLINICAL DATA:  Chest pain EXAM: CHEST - 2 VIEW COMPARISON:  Chest radiograph dated 03/06/2023 FINDINGS: Normal lung volumes. No focal consolidations. No pleural effusion or pneumothorax. The heart size and mediastinal contours are within normal limits. Cervical  spinal fixation hardware appears intact. Degenerative changes of bilateral shoulders. IMPRESSION: No active cardiopulmonary disease. Electronically Signed   By: Limin  Xu M.D.   On: 07/31/2023 16:10     Procedures   Medications Ordered in the ED - No data to display                                  Medical Decision Making Amount and/or Complexity of Data Reviewed Labs: ordered. Radiology: ordered.   This patient is a 73 y.o. female  who presents to the ED for concern of chest pain.   Differential diagnoses prior to evaluation: The emergent differential diagnosis includes, but is not limited to,  ACS, AAS, PE, Mallory-Weiss, Boerhaave's, Pneumonia, acute bronchitis, asthma or COPD exacerbation, anxiety, MSK pain or traumatic injury to the chest, acid reflux versus other . This is not an exhaustive differential.   Past Medical History / Co-morbidities / Social History: obesity, hyperlipidemia, hypertension, osteoarthritis, asthma  Additional history: Chart reviewed. Pertinent results include: Reviewed echo with normal ejection fraction from 2023.  No previous stress test noted.  Physical Exam: Physical exam performed. The pertinent findings include: Patient is well-appearing, stable vital signs throughout exam, no significant tenderness to palpation over chest wall.  Lab Tests/Imaging studies: I personally interpreted labs/imaging and the pertinent results include: CBC unremarkable, BMP unremarkable, negative troponin x 2.  I independently interpreted plain film chest x-ray which shows no  evidence of acute intrathoracic abnormality. I agree with the radiologist interpretation.  Cardiac monitoring: EKG obtained and interpreted by myself and attending physician which shows: Normal sinus rhythm, no acute ST-T changes   Medications: Given patient's age and risk factors I do think reasonable to provide an ambulatory referral to cardiology for further workup, including possible repeat echo, stress test, discussed extensive return precautions otherwise patient stable for discharge at this time, suspect possible musculoskeletal version of chest pain   Disposition: After consideration of the diagnostic results and the patients response to treatment, I feel that patient is stable for discharge with plan as above.   emergency department workup does not suggest an emergent condition requiring admission or immediate intervention beyond what has been performed at this time. The plan is: as above. The patient is safe for discharge and has been instructed to return immediately for worsening symptoms, change in symptoms or any other concerns.   Final diagnoses:  Atypical chest pain    ED Discharge Orders          Ordered    Ambulatory referral to Cardiology        07/31/23 1820               Rosan Sherlean VEAR DEVONNA 07/31/23 1829    Cottie Donnice PARAS, MD 07/31/23 1949

## 2023-07-31 NOTE — ED Notes (Signed)
 RN reviewed discharge instructions with pt. Pt verbalized understanding and had no further questions. VSS upon discharge.

## 2023-08-01 ENCOUNTER — Other Ambulatory Visit

## 2023-08-07 ENCOUNTER — Inpatient Hospital Stay
Admission: RE | Admit: 2023-08-07 | Discharge: 2023-08-07 | Disposition: A | Discharge status: Home / Self Care | Source: Ambulatory Visit | Attending: Sports Medicine

## 2023-08-07 ENCOUNTER — Ambulatory Visit
Admission: RE | Admit: 2023-08-07 | Discharge: 2023-08-07 | Disposition: A | Discharge status: Home / Self Care | Source: Ambulatory Visit | Attending: Sports Medicine

## 2023-08-07 DIAGNOSIS — M12811 Other specific arthropathies, not elsewhere classified, right shoulder: Secondary | ICD-10-CM

## 2023-08-07 DIAGNOSIS — M19011 Primary osteoarthritis, right shoulder: Secondary | ICD-10-CM

## 2023-08-07 DIAGNOSIS — G8929 Other chronic pain: Secondary | ICD-10-CM

## 2023-08-07 DIAGNOSIS — M25511 Pain in right shoulder: Secondary | ICD-10-CM | POA: Diagnosis not present

## 2023-08-07 MED ORDER — IOPAMIDOL (ISOVUE-M 200) INJECTION 41%
10.0000 mL | Freq: Once | INTRAMUSCULAR | Status: AC
  Administered 2023-08-07: 10 mL via INTRA_ARTICULAR

## 2023-08-07 NOTE — Procedures (Signed)
 Interventional Radiology Procedure Note  Risks and benefits of joint injection were discussed with the patient including, but not limited to bleeding, infection, injection of contrast outside the joint, and damage to adjacent structures.  All of the patient's questions were answered, patient is agreeable to proceed. Consent signed and in chart.  A timeout was performed with all members of the team prior to start of the procedure. Correct patient and correct procedure was confirmed. Allergies were reviewed.   PROCEDURE SUMMARY:  Successful fluoro guided right shoulder injection. No immediate complications.  Pt tolerated well.   EBL = none  Please see full dictation in imaging section of Epic for procedure details.

## 2023-08-14 ENCOUNTER — Other Ambulatory Visit: Payer: Self-pay | Admitting: Surgical

## 2023-08-14 NOTE — Telephone Encounter (Signed)
[name]'s patient.

## 2023-08-17 ENCOUNTER — Ambulatory Visit (INDEPENDENT_AMBULATORY_CARE_PROVIDER_SITE_OTHER): Admitting: Orthopedic Surgery

## 2023-08-17 ENCOUNTER — Encounter: Payer: Self-pay | Admitting: Orthopedic Surgery

## 2023-08-17 DIAGNOSIS — M19011 Primary osteoarthritis, right shoulder: Secondary | ICD-10-CM

## 2023-08-17 NOTE — Progress Notes (Signed)
 Office Visit Note   Patient: [name]           Date of Birth: 1950-07-21           MRN: [id number] Visit Date: 08/17/2023 Requested by: Dyane Anthony RAMAN, FNP [address] [place],  [place] [address] PCP: Dyane Anthony RAMAN, FNP  Subjective: Chief Complaint  Patient presents with   Other    Follow up to review MRI    HPI: DAVIANNA DEUTSCHMAN is a 73 y.o. female who presents to the office reporting right shoulder pain.  Since she was last seen she has had an MRI scan.  MRI scan shows severe glenohumeral arthritis along with full-thickness mildly retracted tear of the supraspinatus and partial-thickness tearing of the subscap and infraspinatus as well with severe intra-articular biceps tendinosis.  Patient has had multiple prior surgeries including knee replacements hip replacements as well as 3 back and neck surgeries.  Takes diclofenac for her symptoms.  She likes to work in the yard.  Getting her close on has become much more difficult.  Injections helped but they do not last.  She does live by herself but her daughter lives very nearby down the street..                ROS: All systems reviewed are negative as they relate to the chief complaint within the history of present illness.  Patient denies fevers or chills.  Assessment & Plan: Visit Diagnoses:  1. Primary osteoarthritis, right shoulder     Plan: Impression is right shoulder arthritis with a small rotator cuff tear of the supraspinatus.  Patient has really had a lot of surgeries including bilateral knee replacement bilateral hip replacement as well as neck and back surgery.  Her shoulder pain is becoming untenable.  She has had an injection which gave her reasonable but only temporary relief.  This was an ultrasound-guided glenohumeral joint injection.  I had a long talk with Luke today about operative and nonoperative treatment of her shoulder problem.  Operative treatment would be reverse shoulder replacement.   The risk and benefits are discussed with her including not limited to infection or vessel damage incomplete pain weak as well as incomplete functional restoration.  I think however in Kim's case she would have predictable pain relief and improved function.  The rehabilitation required is also discussed.  Patient understands the risk and benefits and wishes to consider her options.  With the use of models and drawings and diagrams the rationale behind patient specific instrumentation is discussed.  All questions answered.  Follow-Up Instructions: No follow-ups on file.   Orders:  No orders of the defined types were placed in this encounter.  No orders of the defined types were placed in this encounter.     Procedures: No procedures performed   Clinical Data: No additional findings.  Objective: Vital Signs: LMP 11/15/2015 Comment: spotting 11-15-15  Physical Exam:  Constitutional: Patient appears well-developed HEENT:  Head: Normocephalic Eyes:EOM are normal Neck: Normal range of motion Cardiovascular: Normal rate Pulmonary/chest: Effort normal Neurologic: Patient is alert Skin: Skin is warm Psychiatric: Patient has normal mood and affect  Ortho Exam: Ortho exam demonstrates range of motion on the right of 20/60/120.  Has a little weakness to supraspinatus testing but good internal rotation subscap testing 5 out of 5 bilaterally as well as external rotation strength testing at 5 out of 5 bilaterally.  Does have some coarseness with passive range of motion at  90 degrees of abduction consistent with rotator cuff tearing and arthritis.  No discrete AC joint tenderness is present.  Motor or sensory function of both arms intact with palpable radial pulses and good deltoid strength biceps and triceps strength.  Specialty Comments:  No specialty comments available.  Imaging: No results found.   PMFS History: Patient Active Problem List   Diagnosis Date Noted   PND (post-nasal  drip) 03/18/2021   Asthma attack 03/17/2021   Dyslipidemia 03/14/2021   Asthma exacerbation 03/14/2021   Acute asthma exacerbation 03/13/2021   Osteoarthritis of left knee 08/10/2015   Status post total left knee replacement 08/10/2015   Osteoarthritis of left hip 11/02/2014   Status post total replacement of left hip 11/02/2014   Osteoarthritis of right hip 09/08/2014   Status post total replacement of right hip 09/08/2014   PMB (postmenopausal bleeding) 02/28/2013   Morbid obesity (HCC) 02/28/2013   Pure hypercholesterolemia 02/28/2013   Essential hypertension, benign 02/28/2013   Past Medical History:  Diagnosis Date   Abnormal Pap smear of cervix 03/2009   Colpo Biopsy CIN I with HPV   Arthritis    Asthma    Bilateral edema of lower extremity    Takes lasix if needed   Bronchitis    Disc disorder    bulging disc in thoracic area   GERD (gastroesophageal reflux disease)    occ   Hypercholesteremia    Hypertension since 1990's   Lumbar herniated disc    Obesity    Pneumonia    hx   Scoliosis    Spinal stenosis    Teeth grinding     Family History  Problem Relation Age of Onset   Breast cancer Sister 33       bilateral mastectomy- Breast cancer    Hypertension Mother    Heart disease Mother    Stroke Mother    Heart disease Father    Depression Father    Alcohol abuse Father    Stroke Maternal Grandmother    Emphysema Paternal Grandmother    Breast cancer Maternal Aunt        Both aunts in 80 -61's   Heart failure Paternal Grandfather    Alcohol abuse Paternal Grandfather    Depression Daughter     Past Surgical History:  Procedure Laterality Date   APPENDECTOMY  1965   BACK SURGERY  1993   Thoracic and Lumbar   BREAST BIOPSY Right 1990   benign cyst   BREAST EXCISIONAL BIOPSY Right 1990   benign   CERVICAL DISC SURGERY  2007   COLONOSCOPY W/ POLYPECTOMY     HERNIA REPAIR  1977, as child   bilateral inguinal -1 during pregnancy.  Umbilical repair  as child   KNEE ARTHROSCOPY Right 1990   LUMBAR DISC SURGERY  2006   SHOULDER ARTHROSCOPY Left 1994   arthritis, hips,fingers   TONSILLECTOMY AND ADENOIDECTOMY     TOTAL HIP ARTHROPLASTY Right 09/08/2014   Procedure: RIGHT TOTAL HIP ARTHROPLASTY ANTERIOR APPROACH;  Surgeon: Lonni CINDERELLA Poli, MD;  Location: [facility] OR;  Service: Orthopedics;  Laterality: Right;   TOTAL HIP ARTHROPLASTY Left 11/02/2014   Procedure: LEFT TOTAL HIP ARTHROPLASTY ANTERIOR APPROACH;  Surgeon: Lonni CINDERELLA Poli, MD;  Location: [facility] OR;  Service: Orthopedics;  Laterality: Left;   TOTAL KNEE ARTHROPLASTY Right 2009   TOTAL KNEE ARTHROPLASTY Left 08/10/2015   Procedure: LEFT TOTAL KNEE ARTHROPLASTY;  Surgeon: Lonni CINDERELLA Poli, MD;  Location: [facility] OR;  Service: Orthopedics;  Laterality: Left;  Social History   Occupational History   Not on file  Tobacco Use   Smoking status: Never   Smokeless tobacco: Never  Vaping Use   Vaping status: Never Used  Substance and Sexual Activity   Alcohol use: Not Currently    Comment: occasionally    Drug use: No   Sexual activity: Not Currently    Partners: Male    Birth control/protection: Post-menopausal

## 2023-08-18 ENCOUNTER — Encounter: Payer: Self-pay | Admitting: Orthopedic Surgery

## 2023-08-25 ENCOUNTER — Other Ambulatory Visit: Payer: Self-pay | Admitting: Physician Assistant

## 2023-09-04 ENCOUNTER — Other Ambulatory Visit: Payer: Self-pay | Admitting: Sports Medicine

## 2023-09-20 ENCOUNTER — Telehealth: Payer: Self-pay | Admitting: Orthopedic Surgery

## 2023-09-20 NOTE — Telephone Encounter (Signed)
 Called patient to offer surgery dates for right shoulder replacement with Dr. Addie.  No answer.  Left voicemail message providing my name and direct number for scheduling.

## 2023-10-25 DIAGNOSIS — Z23 Encounter for immunization: Secondary | ICD-10-CM | POA: Diagnosis not present

## 2023-10-26 ENCOUNTER — Other Ambulatory Visit: Payer: Self-pay | Admitting: Physician Assistant

## 2023-11-05 ENCOUNTER — Encounter: Payer: Self-pay | Admitting: Radiology

## 2023-12-03 ENCOUNTER — Encounter (HOSPITAL_BASED_OUTPATIENT_CLINIC_OR_DEPARTMENT_OTHER): Payer: Self-pay

## 2023-12-03 ENCOUNTER — Emergency Department (HOSPITAL_BASED_OUTPATIENT_CLINIC_OR_DEPARTMENT_OTHER)

## 2023-12-03 ENCOUNTER — Other Ambulatory Visit: Payer: Self-pay

## 2023-12-03 ENCOUNTER — Emergency Department (HOSPITAL_BASED_OUTPATIENT_CLINIC_OR_DEPARTMENT_OTHER)
Admission: EM | Admit: 2023-12-03 | Discharge: 2023-12-03 | Disposition: A | Discharge status: Home / Self Care | Attending: Emergency Medicine | Admitting: Emergency Medicine

## 2023-12-03 DIAGNOSIS — Z7982 Long term (current) use of aspirin: Secondary | ICD-10-CM | POA: Diagnosis not present

## 2023-12-03 DIAGNOSIS — W01198A Fall on same level from slipping, tripping and stumbling with subsequent striking against other object, initial encounter: Secondary | ICD-10-CM | POA: Insufficient documentation

## 2023-12-03 DIAGNOSIS — S0990XA Unspecified injury of head, initial encounter: Secondary | ICD-10-CM | POA: Diagnosis present

## 2023-12-03 DIAGNOSIS — I672 Cerebral atherosclerosis: Secondary | ICD-10-CM | POA: Diagnosis not present

## 2023-12-03 DIAGNOSIS — S0003XA Contusion of scalp, initial encounter: Secondary | ICD-10-CM | POA: Diagnosis not present

## 2023-12-03 MED ORDER — ACETAMINOPHEN 500 MG PO TABS
1000.0000 mg | ORAL_TABLET | Freq: Once | ORAL | Status: AC
  Administered 2023-12-03: 1000 mg via ORAL
  Filled 2023-12-03: qty 2

## 2023-12-03 NOTE — ED Triage Notes (Signed)
 Pt c/o mechanical fall, head hit wall/ doorframe. On baby ASA , saw stars & has goose egg. Incident approx ago. Advises intermittent dizziness, but it's not too bad.

## 2023-12-03 NOTE — ED Provider Notes (Signed)
 [place] EMERGENCY DEPARTMENT AT [facility] Provider Note   CSN: [id number] Arrival date & time: 12/03/23  1740     Patient presents with: Fall and Head Injury   [name] is a 73 y.o. female presenting to the ED with a mechanical fall and striking the right side of her head on a door frame.  This occurred about 40 minutes prior to arrival.  She has some dizziness and a headache.  Denies blurred vision or nausea.  No history of concussions.  Not on blood thinners no other injuries.   HPI     Prior to Admission medications   Medication Sig Start Date End Date Taking? Authorizing Provider  albuterol  (VENTOLIN  HFA) 108 (90 Base) MCG/ACT inhaler Inhale 2 puffs into the lungs every 6 (six) hours as needed for wheezing or shortness of breath.    [provider]  Alpha Lipoic Acid 200 MG CAPS Take 200 mg by mouth daily.    [provider]  aspirin  81 MG EC tablet Take 81 mg by mouth See admin instructions. **pt takes twice a week** Monday and Thursday    [provider]  azelastine  (ASTELIN ) 0.1 % nasal spray Place 2 sprays into both nostrils 2 (two) times daily. Use in each nostril as directed 03/18/21   Earley Saucer, MD  chlorpheniramine-HYDROcodone  10-8 MG/5ML Take 5 mLs by mouth at bedtime. 03/09/21   [provider]  cholecalciferol  (VITAMIN D ) 1000 UNITS tablet Take 1,000 Units by mouth 2 (two) times daily.    [provider]  Cinnamon 500 MG capsule Take 500 mg by mouth 2 (two) times daily.    [provider]  ciprofloxacin -dexamethasone  (CIPRODEX ) OTIC suspension Place 4 drops into both ears 2 (two) times daily. 02/28/22   Logan Ubaldo KATHEE, PA-C  Coenzyme Q10 (CO Q-10) 200 MG CAPS Take 200 mg by mouth every evening.    [provider]  cyclobenzaprine  (FLEXERIL ) 5 MG tablet TAKE 1 TABLET BY MOUTH 3 TIMES DAILY AS NEEDED FOR UP TO 21 DAYS FOR MUSCLE SPASMS. 09/04/23   Brooks, Dana, DO  diclofenac  (VOLTAREN ) 75 MG  EC tablet TAKE 1 TABLET TWICE A DAY AS NEEDED FOR MILD PAIN 10/26/23   Gretta Bertrum ORN, PA-C  docusate sodium  (COLACE) 100 MG capsule Take 200 mg by mouth at bedtime.    [provider]  felodipine  (PLENDIL ) 10 MG 24 hr tablet Take 10 mg by mouth daily.    [provider]  FLAXSEED, LINSEED, PO Take 1,300 mg by mouth daily.     [provider]  Garlic  1000 MG CAPS Take 1,000 mg by mouth daily.    [provider]  Grape Seed 50 MG TABS Take 50 mg by mouth daily.    [provider]  Guaifenesin  1200 MG TB12 Take 1,200 mg by mouth 2 (two) times daily.    [provider]  ipratropium-albuterol  (DUONEB) 0.5-2.5 (3) MG/3ML SOLN Take 3 mLs by nebulization every 6 (six) hours as needed (shortness of breath). 03/18/21 04/17/21  Rizwan, Saima, MD  irbesartan  (AVAPRO ) 300 MG tablet Take 300 mg by mouth daily.    [provider]  labetalol  (NORMODYNE ) 200 MG tablet Take 400 mg by mouth 2 (two) times daily.     [provider]  lactobacillus acidophilus (BACID) TABS tablet Take 1 tablet by mouth every morning.    [provider]  Lecithin 1200 MG CAPS Take 1,200 mg by mouth daily.    [provider]  levofloxacin  (LEVAQUIN ) 500 MG tablet Take 1 tablet (500 mg total) by mouth daily. 02/28/22   Logan Ubaldo NOVAK, PA-C  Lutein  20 MG TABS Take 20 mg by mouth daily.    [provider]  meclizine  (ANTIVERT ) 25 MG tablet     [provider]  Multiple Vitamin (MULTIVITAMIN WITH MINERALS) TABS tablet Take 0.5 tablets by mouth 2 (two) times daily.     [provider]  Omega 3 1200 MG CAPS Take 1,200 mg by mouth 2 (two) times daily.    [provider]  simvastatin  (ZOCOR ) 20 MG tablet Take 20 mg by mouth at bedtime.     [provider]  sodium chloride  (OCEAN) 0.65 % SOLN nasal spray Place 1 spray into both nostrils as needed for congestion.    [provider]  vitamin C  (ASCORBIC  ACID) 500 MG tablet Take 500 mg by mouth daily.    [provider]    Allergies: Etodolac and Adhesive [tape]    Review of Systems  Updated Vital Signs BP (!) 142/97 (BP Location: Right Arm)   Pulse 85   Temp 98 F (36.7 C)   Resp 18   LMP 11/15/2015 Comment: spotting 11-15-15  SpO2 99%   Physical Exam Constitutional:      General: She is not in acute distress. HENT:     Head: Normocephalic.     Comments: Hematoma to the right parietal scalp Eyes:     Conjunctiva/sclera: Conjunctivae normal.     Pupils: Pupils are equal, round, and reactive to light.  Cardiovascular:     Rate and Rhythm: Normal rate and regular rhythm.  Pulmonary:     Effort: Pulmonary effort is normal. No respiratory distress.  Abdominal:     General: There is no distension.     Tenderness: There is no abdominal tenderness.  Musculoskeletal:     Comments: No spinal midline tenderness  Skin:    General: Skin is warm and dry.  Neurological:     General: No focal deficit present.     Mental Status: She is alert. Mental status is at baseline.  Psychiatric:        Mood and Affect: Mood normal.        Behavior: Behavior normal.     (all labs ordered are listed, but only abnormal results are displayed) Labs Reviewed - No data to display  EKG: None  Radiology: CT Head Wo Contrast Result Date: 12/03/2023 CLINICAL DATA:  Blunt trauma, mechanical fall. EXAM: CT HEAD WITHOUT CONTRAST TECHNIQUE: Contiguous axial images were obtained from the base of the skull through the vertex without intravenous contrast. RADIATION DOSE REDUCTION: This exam was performed according to the departmental dose-optimization program which includes automated exposure control, adjustment of the mA and/or kV according to patient size and/or use of iterative reconstruction technique. COMPARISON:  None Available. FINDINGS: Brain: No intracranial hemorrhage, mass effect, or midline shift. Brain volume is normal for age. No  hydrocephalus. The basilar cisterns are patent. No evidence of territorial infarct or acute ischemia. No extra-axial or intracranial fluid collection. Vascular: Atherosclerosis of skullbase vasculature without hyperdense vessel or abnormal calcification. Skull: No fracture or focal lesion. Sinuses/Orbits: No acute fracture. Mild mucosal thickening throughout the paranasal sinuses, particularly maxillary sinuses. Trace opacification of lower left mastoid air cells. Other: Small right frontal scalp hematoma. IMPRESSION: Small right frontal scalp hematoma. No acute intracranial abnormality. No skull fracture. Electronically Signed   By: Andrea Gasman M.D.   On:  12/03/2023 19:25     Procedures   Medications Ordered in the ED  acetaminophen  (TYLENOL ) tablet 1,000 mg (has no administration in time range)    Clinical Course as of 12/03/23 1934  Mon Dec 03, 2023  8065 Patient reassessed and is stable for discharge [MT]    Clinical Course User Index [MT] Benancio Osmundson, Donnice PARAS, MD                                 Medical Decision Making Amount and/or Complexity of Data Reviewed Radiology: ordered.  Risk OTC drugs.   Patient is here with a mechanical fall and isolated injury of the head.  CT imaging of the brain was reviewed, notable for no emergent findings  She has a GCS of 15 and is mentating well.  I do not suspect a moderate or severe concussion from these current symptoms.  Doubt spinal fracture or any additional traumatic injuries.  She was here with her daughter at bedside.  They are comfortable going home     Final diagnoses:  Hematoma of scalp, initial encounter    ED Discharge Orders     None          Lashaun Poch, Donnice PARAS, MD 12/03/23 [phone]

## 2023-12-28 ENCOUNTER — Other Ambulatory Visit: Payer: Self-pay | Admitting: Physician Assistant
